# Patient Record
Sex: Female | Born: 1945
Health system: Southern US, Community
[De-identification: ages and names within clinical notes are randomized; demographics above are authoritative.]

## PROBLEM LIST (undated history)

## (undated) DIAGNOSIS — R51 Headache: Secondary | ICD-10-CM

## (undated) DIAGNOSIS — K219 Gastro-esophageal reflux disease without esophagitis: Secondary | ICD-10-CM

## (undated) DIAGNOSIS — F419 Anxiety disorder, unspecified: Secondary | ICD-10-CM

## (undated) DIAGNOSIS — M1990 Unspecified osteoarthritis, unspecified site: Secondary | ICD-10-CM

## (undated) DIAGNOSIS — D649 Anemia, unspecified: Secondary | ICD-10-CM

## (undated) DIAGNOSIS — Z8719 Personal history of other diseases of the digestive system: Secondary | ICD-10-CM

## (undated) DIAGNOSIS — K449 Diaphragmatic hernia without obstruction or gangrene: Secondary | ICD-10-CM

## (undated) DIAGNOSIS — M797 Fibromyalgia: Secondary | ICD-10-CM

## (undated) DIAGNOSIS — I10 Essential (primary) hypertension: Secondary | ICD-10-CM

## (undated) HISTORY — PX: HERNIA REPAIR: SHX51

## (undated) HISTORY — PX: COLON SURGERY: SHX602

## (undated) HISTORY — DX: Unspecified osteoarthritis, unspecified site: M19.90

## (undated) HISTORY — PX: OTHER SURGICAL HISTORY: SHX169

## (undated) HISTORY — PX: APPENDECTOMY: SHX54

## (undated) HISTORY — DX: Gastro-esophageal reflux disease without esophagitis: K21.9

---

## 1985-12-10 HISTORY — PX: BREAST EXCISIONAL BIOPSY: SUR124

## 2003-12-11 HISTORY — PX: HIATAL HERNIA REPAIR: SHX195

## 2005-09-22 ENCOUNTER — Emergency Department (HOSPITAL_COMMUNITY): Admission: EM | Admit: 2005-09-22 | Discharge: 2005-09-22 | Payer: Self-pay | Admitting: Emergency Medicine

## 2005-10-10 ENCOUNTER — Encounter: Admission: RE | Admit: 2005-10-10 | Discharge: 2005-10-10 | Payer: Self-pay | Admitting: Gastroenterology

## 2005-12-10 HISTORY — PX: HIATAL HERNIA REPAIR: SHX195

## 2006-02-28 ENCOUNTER — Inpatient Hospital Stay (HOSPITAL_COMMUNITY): Admission: RE | Admit: 2006-02-28 | Discharge: 2006-03-03 | Payer: Self-pay | Admitting: Surgery

## 2008-12-10 HISTORY — PX: LEFT COLECTOMY: SHX856

## 2008-12-10 HISTORY — PX: COLOSTOMY CLOSURE: SHX1381

## 2009-03-29 ENCOUNTER — Encounter: Admission: RE | Admit: 2009-03-29 | Discharge: 2009-03-29 | Payer: Self-pay | Admitting: Emergency Medicine

## 2009-03-30 ENCOUNTER — Inpatient Hospital Stay (HOSPITAL_COMMUNITY): Admission: EM | Admit: 2009-03-30 | Discharge: 2009-04-08 | Payer: Self-pay | Admitting: Emergency Medicine

## 2009-06-03 ENCOUNTER — Encounter: Admission: RE | Admit: 2009-06-03 | Discharge: 2009-06-03 | Payer: Self-pay | Admitting: General Surgery

## 2009-06-25 ENCOUNTER — Encounter (INDEPENDENT_AMBULATORY_CARE_PROVIDER_SITE_OTHER): Payer: Self-pay | Admitting: General Surgery

## 2009-06-25 ENCOUNTER — Inpatient Hospital Stay (HOSPITAL_COMMUNITY): Admission: EM | Admit: 2009-06-25 | Discharge: 2009-06-30 | Payer: Self-pay | Admitting: Emergency Medicine

## 2009-08-10 ENCOUNTER — Inpatient Hospital Stay (HOSPITAL_COMMUNITY): Admission: RE | Admit: 2009-08-10 | Discharge: 2009-08-14 | Payer: Self-pay | Admitting: General Surgery

## 2009-08-10 ENCOUNTER — Encounter (INDEPENDENT_AMBULATORY_CARE_PROVIDER_SITE_OTHER): Payer: Self-pay | Admitting: General Surgery

## 2009-12-30 ENCOUNTER — Encounter: Admission: RE | Admit: 2009-12-30 | Discharge: 2009-12-30 | Payer: Self-pay | Admitting: General Surgery

## 2010-03-31 ENCOUNTER — Encounter: Admission: RE | Admit: 2010-03-31 | Discharge: 2010-03-31 | Payer: Self-pay | Admitting: Emergency Medicine

## 2010-09-06 ENCOUNTER — Inpatient Hospital Stay (HOSPITAL_COMMUNITY): Admission: RE | Admit: 2010-09-06 | Discharge: 2010-09-08 | Payer: Self-pay | Admitting: General Surgery

## 2010-09-06 HISTORY — PX: VENTRAL HERNIA REPAIR: SHX424

## 2010-10-12 ENCOUNTER — Encounter: Admission: RE | Admit: 2010-10-12 | Discharge: 2010-10-12 | Payer: Self-pay | Admitting: General Surgery

## 2010-11-09 HISTORY — PX: LAPAROSCOPIC INCISIONAL / UMBILICAL / VENTRAL HERNIA REPAIR: SUR789

## 2010-11-27 ENCOUNTER — Inpatient Hospital Stay (HOSPITAL_COMMUNITY)
Admission: RE | Admit: 2010-11-27 | Discharge: 2010-11-29 | Payer: Self-pay | Source: Home / Self Care | Attending: Surgery | Admitting: Surgery

## 2011-02-19 LAB — BASIC METABOLIC PANEL
BUN: 12 mg/dL (ref 6–23)
CO2: 28 mEq/L (ref 19–32)
Calcium: 9.8 mg/dL (ref 8.4–10.5)
Chloride: 103 mEq/L (ref 96–112)
Creatinine, Ser: 0.74 mg/dL (ref 0.4–1.2)
GFR calc Af Amer: >60 mL/min (ref >60)
GFR calc non Af Amer: >60 mL/min (ref >60)
Glucose, Bld: 100 mg/dL — ABNORMAL HIGH (ref 70–99)
Potassium: 4.3 mEq/L (ref 3.5–5.1)
Sodium: 139 mEq/L (ref 135–145)

## 2011-02-19 LAB — SURGICAL PCR SCREEN
MRSA, PCR: NEGATIVE
Staphylococcus aureus: NEGATIVE

## 2011-02-19 LAB — GRAM STAIN: Gram Stain: NONE SEEN

## 2011-02-22 LAB — DIFFERENTIAL
Basophils Absolute: 0 10*3/uL (ref 0.0–0.1)
Basophils Relative: 1 % (ref 0–1)
Eosinophils Absolute: 0 10*3/uL (ref 0.0–0.7)
Eosinophils Relative: 1 % (ref 0–5)
Lymphocytes Relative: 27 % (ref 12–46)
Lymphs Abs: 1.3 10*3/uL (ref 0.7–4.0)
Monocytes Absolute: 0.3 10*3/uL (ref 0.1–1.0)
Monocytes Relative: 6 % (ref 3–12)
Neutro Abs: 3.1 10*3/uL (ref 1.7–7.7)
Neutrophils Relative %: 65 % (ref 43–77)

## 2011-02-22 LAB — URINALYSIS, ROUTINE W REFLEX MICROSCOPIC
Bilirubin Urine: NEGATIVE
Glucose, UA: NEGATIVE mg/dL
Hgb urine dipstick: NEGATIVE
Ketones, ur: NEGATIVE mg/dL
Nitrite: NEGATIVE
Protein, ur: NEGATIVE mg/dL
Specific Gravity, Urine: 1.007 (ref 1.005–1.030)
Urobilinogen, UA: 0.2 mg/dL (ref 0.0–1.0)
pH: 7 (ref 5.0–8.0)

## 2011-02-22 LAB — URINE MICROSCOPIC-ADD ON

## 2011-02-22 LAB — CBC
HCT: 33.9 % — ABNORMAL LOW (ref 36.0–46.0)
HCT: 40.5 % (ref 36.0–46.0)
Hemoglobin: 11.8 g/dL — ABNORMAL LOW (ref 12.0–15.0)
Hemoglobin: 14.2 g/dL (ref 12.0–15.0)
MCH: 32.5 pg (ref 26.0–34.0)
MCHC: 35.2 g/dL (ref 30.0–36.0)
MCV: 92.3 fL (ref 78.0–100.0)
MCV: 92.6 fL (ref 78.0–100.0)
Platelets: 209 10*3/uL (ref 150–400)
RBC: 3.66 MIL/uL — ABNORMAL LOW (ref 3.87–5.11)
RBC: 4.39 MIL/uL (ref 3.87–5.11)
RDW: 13.5 % (ref 11.5–15.5)
WBC: 4.8 10*3/uL (ref 4.0–10.5)
WBC: 6.1 10*3/uL (ref 4.0–10.5)

## 2011-02-22 LAB — BASIC METABOLIC PANEL
BUN: 9 mg/dL (ref 6–23)
CO2: 30 mEq/L (ref 19–32)
Calcium: 9.6 mg/dL (ref 8.4–10.5)
Chloride: 102 mEq/L (ref 96–112)
Chloride: 105 mEq/L (ref 96–112)
Creatinine, Ser: 0.63 mg/dL (ref 0.4–1.2)
GFR calc Af Amer: >60 mL/min (ref >60)
GFR calc Af Amer: >60 mL/min (ref >60)
GFR calc non Af Amer: >60 mL/min (ref >60)
Glucose, Bld: 93 mg/dL (ref 70–99)
Potassium: 3.9 mEq/L (ref 3.5–5.1)
Potassium: 4.3 mEq/L (ref 3.5–5.1)
Sodium: 140 mEq/L (ref 135–145)

## 2011-02-22 LAB — SURGICAL PCR SCREEN
MRSA, PCR: NEGATIVE
Staphylococcus aureus: NEGATIVE

## 2011-02-22 LAB — PROTIME-INR
INR: 1.02 (ref 0.00–1.49)
Prothrombin Time: 13.6 seconds (ref 11.6–15.2)

## 2011-02-22 LAB — APTT: aPTT: 32 seconds (ref 24–37)

## 2011-03-16 LAB — BASIC METABOLIC PANEL
BUN: 9 mg/dL (ref 6–23)
CO2: 27 mEq/L (ref 19–32)
Calcium: 8.8 mg/dL (ref 8.4–10.5)
GFR calc Af Amer: >60 mL/min (ref >60)
GFR calc Af Amer: >60 mL/min (ref >60)
GFR calc non Af Amer: >60 mL/min (ref >60)
Potassium: 4.6 mEq/L (ref 3.5–5.1)
Sodium: 137 mEq/L (ref 135–145)
Sodium: 137 mEq/L (ref 135–145)

## 2011-03-16 LAB — CBC
HCT: 33.4 % — ABNORMAL LOW (ref 36.0–46.0)
Hemoglobin: 11.4 g/dL — ABNORMAL LOW (ref 12.0–15.0)
Platelets: 219 10*3/uL (ref 150–400)
RBC: 3.73 MIL/uL — ABNORMAL LOW (ref 3.87–5.11)
WBC: 11.7 10*3/uL — ABNORMAL HIGH (ref 4.0–10.5)

## 2011-03-16 LAB — ABO/RH: ABO/RH(D): A POS

## 2011-03-16 LAB — TYPE AND SCREEN

## 2011-03-17 LAB — BASIC METABOLIC PANEL
GFR calc non Af Amer: >60 mL/min (ref >60)
Glucose, Bld: 87 mg/dL (ref 70–99)
Potassium: 4.9 mEq/L (ref 3.5–5.1)
Sodium: 142 mEq/L (ref 135–145)

## 2011-03-17 LAB — DIFFERENTIAL
Eosinophils Relative: 2 % (ref 0–5)
Lymphocytes Relative: 21 % (ref 12–46)
Lymphs Abs: 1.2 10*3/uL (ref 0.7–4.0)
Monocytes Absolute: 0.3 10*3/uL (ref 0.1–1.0)
Monocytes Relative: 5 % (ref 3–12)

## 2011-03-17 LAB — CBC
HCT: 37.5 % (ref 36.0–46.0)
Hemoglobin: 12.8 g/dL (ref 12.0–15.0)
WBC: 5.7 10*3/uL (ref 4.0–10.5)

## 2011-03-17 LAB — URINALYSIS, ROUTINE W REFLEX MICROSCOPIC
Glucose, UA: NEGATIVE mg/dL
pH: 5 (ref 5.0–8.0)

## 2011-03-18 LAB — COMPREHENSIVE METABOLIC PANEL
ALT: 19 U/L (ref 0–35)
Albumin: 3.8 g/dL (ref 3.5–5.2)
Alkaline Phosphatase: 54 U/L (ref 39–117)
BUN: 7 mg/dL (ref 6–23)
Chloride: 101 mEq/L (ref 96–112)
Glucose, Bld: 100 mg/dL — ABNORMAL HIGH (ref 70–99)
Potassium: 3.2 mEq/L — ABNORMAL LOW (ref 3.5–5.1)
Sodium: 136 mEq/L (ref 135–145)
Total Bilirubin: 1.3 mg/dL — ABNORMAL HIGH (ref 0.3–1.2)

## 2011-03-18 LAB — DIFFERENTIAL
Basophils Absolute: 0 10*3/uL (ref 0.0–0.1)
Basophils Relative: 0 % (ref 0–1)
Eosinophils Absolute: 0 10*3/uL (ref 0.0–0.7)
Monocytes Absolute: 0.3 10*3/uL (ref 0.1–1.0)
Neutro Abs: 7.9 10*3/uL — ABNORMAL HIGH (ref 1.7–7.7)

## 2011-03-18 LAB — CBC
HCT: 33.3 % — ABNORMAL LOW (ref 36.0–46.0)
HCT: 41.2 % (ref 36.0–46.0)
Hemoglobin: 10.2 g/dL — ABNORMAL LOW (ref 12.0–15.0)
Hemoglobin: 11.4 g/dL — ABNORMAL LOW (ref 12.0–15.0)
Hemoglobin: 14.2 g/dL (ref 12.0–15.0)
Platelets: 200 10*3/uL (ref 150–400)
Platelets: 244 10*3/uL (ref 150–400)
RDW: 14.6 % (ref 11.5–15.5)
RDW: 15.4 % (ref 11.5–15.5)
WBC: 8.1 10*3/uL (ref 4.0–10.5)
WBC: 9.1 10*3/uL (ref 4.0–10.5)

## 2011-03-18 LAB — BASIC METABOLIC PANEL
CO2: 26 mEq/L (ref 19–32)
Calcium: 8 mg/dL — ABNORMAL LOW (ref 8.4–10.5)
Calcium: 8.3 mg/dL — ABNORMAL LOW (ref 8.4–10.5)
Calcium: 8.6 mg/dL (ref 8.4–10.5)
Creatinine, Ser: 0.46 mg/dL (ref 0.4–1.2)
GFR calc Af Amer: >60 mL/min (ref >60)
GFR calc Af Amer: >60 mL/min (ref >60)
GFR calc non Af Amer: >60 mL/min (ref >60)
GFR calc non Af Amer: >60 mL/min (ref >60)
Glucose, Bld: 100 mg/dL — ABNORMAL HIGH (ref 70–99)
Potassium: 4.1 mEq/L (ref 3.5–5.1)
Sodium: 137 mEq/L (ref 135–145)
Sodium: 139 mEq/L (ref 135–145)

## 2011-03-18 LAB — URINALYSIS, ROUTINE W REFLEX MICROSCOPIC
Bilirubin Urine: NEGATIVE
Ketones, ur: NEGATIVE mg/dL
Nitrite: NEGATIVE
Protein, ur: NEGATIVE mg/dL
Specific Gravity, Urine: 1.008 (ref 1.005–1.030)
Urobilinogen, UA: 0.2 mg/dL (ref 0.0–1.0)

## 2011-03-18 LAB — SAMPLE TO BLOOD BANK

## 2011-03-18 LAB — URINE MICROSCOPIC-ADD ON

## 2011-03-18 LAB — APTT: aPTT: 33 seconds (ref 24–37)

## 2011-03-18 LAB — PHOSPHORUS: Phosphorus: 3.7 mg/dL (ref 2.3–4.6)

## 2011-03-21 LAB — CBC
HCT: 27.5 % — ABNORMAL LOW (ref 36.0–46.0)
HCT: 28.1 % — ABNORMAL LOW (ref 36.0–46.0)
Hemoglobin: 10.1 g/dL — ABNORMAL LOW (ref 12.0–15.0)
Hemoglobin: 11.9 g/dL — ABNORMAL LOW (ref 12.0–15.0)
MCHC: 34.2 g/dL (ref 30.0–36.0)
MCHC: 34.7 g/dL (ref 30.0–36.0)
MCV: 89.3 fL (ref 78.0–100.0)
MCV: 89.4 fL (ref 78.0–100.0)
MCV: 89.7 fL (ref 78.0–100.0)
MCV: 89.8 fL (ref 78.0–100.0)
Platelets: 322 10*3/uL (ref 150–400)
Platelets: 414 10*3/uL — ABNORMAL HIGH (ref 150–400)
RBC: 3.14 MIL/uL — ABNORMAL LOW (ref 3.87–5.11)
RBC: 3.99 MIL/uL (ref 3.87–5.11)
RBC: 4.29 MIL/uL (ref 3.87–5.11)
RDW: 13.8 % (ref 11.5–15.5)
RDW: 14.2 % (ref 11.5–15.5)
WBC: 5.4 10*3/uL (ref 4.0–10.5)
WBC: 5.9 10*3/uL (ref 4.0–10.5)
WBC: 8.5 10*3/uL (ref 4.0–10.5)
WBC: 9.7 10*3/uL (ref 4.0–10.5)

## 2011-03-21 LAB — BASIC METABOLIC PANEL
BUN: 1 mg/dL — ABNORMAL LOW (ref 6–23)
BUN: 3 mg/dL — ABNORMAL LOW (ref 6–23)
Calcium: 8.6 mg/dL (ref 8.4–10.5)
Chloride: 106 mEq/L (ref 96–112)
Chloride: 106 mEq/L (ref 96–112)
Chloride: 107 mEq/L (ref 96–112)
Creatinine, Ser: 0.65 mg/dL (ref 0.4–1.2)
Creatinine, Ser: 0.72 mg/dL (ref 0.4–1.2)
GFR calc Af Amer: >60 mL/min (ref >60)
GFR calc non Af Amer: >60 mL/min (ref >60)
Potassium: 4 mEq/L (ref 3.5–5.1)
Sodium: 139 mEq/L (ref 135–145)

## 2011-03-21 LAB — URINALYSIS, ROUTINE W REFLEX MICROSCOPIC
Bilirubin Urine: NEGATIVE
Glucose, UA: NEGATIVE mg/dL
Ketones, ur: 15 mg/dL — AB
Nitrite: NEGATIVE
Protein, ur: NEGATIVE mg/dL

## 2011-03-21 LAB — LIPASE, BLOOD: Lipase: 12 U/L (ref 11–59)

## 2011-03-21 LAB — COMPREHENSIVE METABOLIC PANEL
ALT: 17 U/L (ref 0–35)
Alkaline Phosphatase: 86 U/L (ref 39–117)
CO2: 22 mEq/L (ref 19–32)
Chloride: 99 mEq/L (ref 96–112)
GFR calc non Af Amer: >60 mL/min (ref >60)
Glucose, Bld: 105 mg/dL — ABNORMAL HIGH (ref 70–99)
Potassium: 3.9 mEq/L (ref 3.5–5.1)
Sodium: 133 mEq/L — ABNORMAL LOW (ref 135–145)

## 2011-03-21 LAB — PROTIME-INR
INR: 1.6 — ABNORMAL HIGH (ref 0.00–1.49)
Prothrombin Time: 19.5 seconds — ABNORMAL HIGH (ref 11.6–15.2)

## 2011-03-21 LAB — DIFFERENTIAL
Basophils Relative: 1 % (ref 0–1)
Eosinophils Absolute: 0 10*3/uL (ref 0.0–0.7)
Neutrophils Relative %: 86 % — ABNORMAL HIGH (ref 43–77)

## 2011-03-21 LAB — APTT: aPTT: 41 seconds — ABNORMAL HIGH (ref 24–37)

## 2011-03-21 LAB — URINE MICROSCOPIC-ADD ON

## 2011-04-24 NOTE — Discharge Summary (Signed)
NAMEMarland Kitchen  COLBI, SCHILTZ NO.:  [id number]   MEDICAL RECORD NO.:  [id number]          PATIENT TYPE:  INP   LOCATION:  1536                         FACILITY:  [facility]   PHYSICIAN:  Almond Lint, MD       DATE OF BIRTH:  10/05/1946   DATE OF ADMISSION:  06/25/2009  DATE OF DISCHARGE:  06/30/2009                               DISCHARGE SUMMARY   DIAGNOSIS:  Perforated diverticulitis.   PRINCIPAL PROCEDURE:  Hartmann's procedure with therapeutic appendectomy   Secondary procedure  CT scan demonstrating diffuse free air in the abdomen and probable  ruptured diverticulitis.   Condition on discharge is improved.   DISCHARGE MEDICATIONS:  Include Augmentin until she finishes the  remainder of her course. Multivitamin, calcium, and Percocet 5/325 one-2  tablets q.4 h p.r.n. pain and Colace are added.   HOSPITAL COURSE:  Ms. Haymaker presented to the emergency department on  June 25, 2009 with ruptured diverticulitis.  She was taken to the  operating on that day and a Hartmann's procedure was performed, which  consisted of a sigmoidectomy and descending colostomy on the left.  Also  she had a therapeutic appendectomy as the appendiceal tip was ruptured  and involved an abscess cavity which was contiguous with perforation of  the diverticulitis.  Postoperatively, she has done very well.  She had a  drain placed in the abscess cavity intraoperatively which has since been  removed.  She tolerated Foley removal well.  Her diet has been advanced  to regular, and she is tolerating this.  She is tolerating oral pain  medications.  She is able to ambulate without difficulty and has  demonstrated the ability to change her ostomy bag.  She will go home  today and will follow up with me in 2-3 weeks.  She will come back to  our clinic in 1 week for staple removal.  We will send her with home  health nursing to check on her 1 or 2 times to make sure she is still  doing well with her  ostomy changes.  She is planning on staying at her  daughter's house for a week or 2 weeks until she gets back on her feet.      Almond Lint, MD  Electronically Signed     FB/MEDQ  D:  06/30/2009  T:  06/30/2009  Job:  [phone]

## 2011-04-24 NOTE — H&P (Signed)
NAMEMarland Kitchen  Carol, Roberts NO.:  [id number]   MEDICAL RECORD NO.:  [id number]          PATIENT TYPE:  INP   LOCATION:  1522                         FACILITY:  [facility]   PHYSICIAN:  Carol Lint, MD       DATE OF BIRTH:  May 29, 1946   DATE OF ADMISSION:  08/10/2009  DATE OF DISCHARGE:                              HISTORY & PHYSICAL   CHIEF COMPLAINT:  Diverticulitis.   HISTORY OF PRESENT ILLNESS:  Carol Roberts is a 65 year old female who I  performed an emergent Hartmann's procedure with therapeutic appendectomy  on June 25, 2009.  She presents today for colostomy takedown.  She has  done her bowel prep.  She is off her pain medicine.  She has not been  having any nausea, vomiting, and has normal appetite.  She is not losing  weight.   PAST MEDICAL HISTORY:  Significant for arthritis, diverticulitis, and  questionable possibility of IBS.   SURGICAL HISTORY:  Nissen fundoplication in 2000, prior hiatal hernia  repairs, Nissen fundoplication redo in 2007.  Left breast biopsy.  Hartmann's and therapeutic appendectomy in July of this year.   SOCIAL HISTORY:  She has one daughter.  She does not abuse substances.  She is a [employer] representative.   FAMILY HISTORY:  Mother, maternal grandmother, and great grandmother  have MI history.   ALLERGIES:  No known drug allergies, but is very sensitive to anything  other than paper tape.   MEDICATIONS:  Vitamin, calcium 600 plus D, Vitamin C 500 mg once a day,  vitamin D 1000 units in the morning, and multivitamin once a day.   PHYSICAL EXAMINATION:  VITAL SIGNS:  She is afebrile.  Her vitals are  normal.  GENERAL:  She is alert and oriented x3, in no acute distress.  HEENT:  Normocephalic and atraumatic.  Sclerae anicteric.  HEART:  Regular.  LUNGS:  Clear.  ABDOMEN:  Soft, nontender, and nondistended.  Stamina is intact.  EXTREMITIES:  Warm and well perfused.   ASSESSMENT:  Carol Roberts presents for colostomy takedown.  IVF,  IV  Antibiotics.      Carol Lint, MD  Electronically Signed     FB/MEDQ  D:  08/10/2009  T:  08/10/2009  Job:  [id number]

## 2011-04-24 NOTE — Discharge Summary (Signed)
NAME:  MACIL, CRADY NO.:  [id number]   MEDICAL RECORD NO.:  [id number]          PATIENT TYPE:  INP   LOCATION:  5120                         FACILITY:  [facility]   PHYSICIAN:  Juanetta Gosling, MDDATE OF BIRTH:  1946/03/24   DATE OF ADMISSION:  03/30/2009  DATE OF DISCHARGE:  04/08/2009                               DISCHARGE SUMMARY   CHIEF COMPLAINT/REASON FOR ADMISSION:  Ms. Lucien is a 65 year old  female patient who has had previous GI problems dating back to 1999.  Initially, they thought she had Crohn disease but after GI evaluation,  this diagnosis was declined.  She presents with 3 weeks of fevers and  left lower quadrant abdominal pain with difficulty urinating and  worsening pain over the past few days.  She had been on antibiotics from  her primary care physician for a total of 10 days without any  improvement in her symptoms.  Because the pain had worsened, she  presented to the ER.  In the ER, the patient was noted to have low-grade  fever of 99.5, mildly tachycardic with a heart rate of 104, otherwise  vital signs were stable.  On exam, her abdomen was soft, nontender, and  nondistended.  Because of treatment for possible diverticulitis, it was  opted to perform a CT scan.  This revealed a 7-cm diverticular abscess.  The patient's white count was 13,000.  The patient was admitted by Dr.  Donell Beers with the following diagnosis:  Diverticulitis with  microperforation and abscess.   HOSPITAL COURSE:  The patient was admitted and placed on n.p.o. status,  IV fluids, and empiric antibiotic therapy with Invanz.  Symptoms were  managed with antiemetics and IV pain medications.   Due to there being a diverticular abscess, Interventional Radiology  reviewed the CT scan and the patient did undergo drainage of the left  lower quadrant diverticular abscess on March 31, 2009, 80 mL of bloody  pus was removed.  The patient continued to improve clinically.  Her  white count normalized, and she eventually defervesced.  She was started  on a clear liquid diet within 48 hours of admission, continued to do  well.  Diet was slowly advanced.  She did have some complaints of some  sharp abdominal pain, so we were slow to otherwise advance the diet.  Repeat CT scan was performed on April 26 that revealed resolution of the  drain fluid collections, so the drain was removed by Interventional  Radiology.  She did have a residual phlegmon, so she was continued on IV  antibiotics.   By April 07, 2009, the patient was afebrile, vital signs were stable.  Subsequent abscess culture revealed few microaerophilic strep.  White  count remained normal at 5900.  Electrolyte panel was normal.  The  patient had mild abdominal pain by report but on exam, she was  essentially nontender.  After discussing with Dr. Dwain Sarna, it was  determined that we would change her Invanz to Augmentin p.o. and  continue to follow her white count and if she remained stable by April 08, 2009, she would be  appropriate for discharge.   On April 08, 2009, the patient's abdomen was benign.  She was tolerating  oral pain meds and oral Augmentin and was otherwise deemed appropriate  for discharge home.   FINAL DISCHARGE DIAGNOSIS:  1. Abdominal pain secondary to acute diverticulitis with      microperforation and abscess.  2. Status post percutaneous drainage of diverticular abscess, resolved      on follow up CT, drain now removed.   DISCHARGE MEDICATIONS:  1. The patient will stop the Cipro and Flagyl she had taken prior to      admission.  If she has any left, she is not to take this any      further.  2. Multiple vitamin daily.  3. Calcium as before daily.  4. Tylenol as before.   NEW MEDICATIONS:  1. Augmentin 875 mg b.i.d. for 7 days.  2. Vicodin 5/325 one to two tablets every 4 hours as needed for pain,      #20 dispensed with 0 refills.   Return to work on Monday, Apr 11, 2009.  A note has been given.   DIET:  She should be on a low-residue diet.   WOUND CARE:  Not applicable.   ACTIVITY:  Increase activity slowly.  May walk up steps.  May shower.   FOLLOWUP APPOINTMENTS:  She needs to call Dr. Donell Beers, the admitting  physician, so she can be seen in 2 weeks for followup.      Allison L. Marya Landry, MD  Electronically Signed    ALE/MEDQ  D:  04/08/2009  T:  04/09/2009  Job:  [id number]   cc:   Almond Lint, MD

## 2011-04-24 NOTE — H&P (Signed)
NAMEMarland Kitchen  Carol, Roberts NO.:  [id number]   MEDICAL RECORD NO.:  [id number]          PATIENT TYPE:  INP   LOCATION:  0104                         FACILITY:  [facility]   PHYSICIAN:  Almond Lint, MD       DATE OF BIRTH:  1946/07/29   DATE OF ADMISSION:  06/25/2009  DATE OF DISCHARGE:                              HISTORY & PHYSICAL   REFERRING PHYSICIAN:  Devoria Albe.   CHIEF COMPLAINT:  Diverticulitis.   HISTORY OF PRESENT ILLNESS:  Ms. Carol Roberts is a 65 year old patient whom I  saw back in April for a diverticular abscess.  We had the abscess  drained and got her out of the hospital.  She has had several courses of  antibiotics and we have had her on the elective schedule for August for  a laparoscopic assisted sigmoid colectomy.  She presents with 24 hours  of worsening abdominal pain, dry heaves and temperatures of 99.  She  also has had general malaise.  She thinks this is much worse pain than  what she had before.  She also developed the inability to tolerate p.o.  The pain is worse with movement and is alleviated by laying curled up.  She denies constipation but has had some diarrhea.   PAST MEDICAL HISTORY:  Significant for arthritis, diverticulitis and  questionable past history of irritable bowel syndrome.  There is a  question of Crohn's disease in the past but she states that this was  ruled out.   SURGICAL HISTORY:  1. Nissen fundoplication by Dr. Daphine Deutscher in mid 2000.  2. She has had two hiatal hernia repairs, one at [facility] and a      redo by Dr. Daphine Deutscher in 2007.  3. Left breast biopsy.   SOCIAL HISTORY:  She does not abuse substances.  She is a billing  specialist.   FAMILY HISTORY:  She has had a mother, a maternal grandmother and great  grandmother with history of heart attacks.   NO KNOWN DRUG ALLERGIES.   MEDICATIONS:  Augmentin.   REVIEW OF SYSTEMS:  Otherwise negative other than the past medical  history and HPI x 11 systems.   EXAMINATION:  Temperature is 98.7, pulse 90, respiratory rate 18, blood  pressure 176/59.  She looks very uncomfortable in general.  HEENT:  Normocephalic/atraumatic, sclerae are anicteric.  NECK:  Supple.  HEART:  Regular.  LUNGS:  Clear.  ABDOMEN:  Soft, slightly distended, exquisitely tender in the left lower  quadrant and mildly tender diffusely.  EXTREMITIES:  Warm and well-perfused with no edema.  SKIN:  No evidence of rashes.   LABS:  CT scan demonstrates free air and inflamed sigmoid.  There is  also still a hint of a chronic abscess cavity in the pelvis.  There is  dilated small bowel.  White blood cell count is 9, potassium 3.2,  bilirubin slightly elevated at 1.3, otherwise labs are essentially  unremarkable.   ASSESSMENT:  Ms. Carol Roberts is a 65 year old female with perforated  diverticulitis.  We will take her to the operating room for a Hartmann's  procedure which is  a sigmoidectomy and end ostomy.  This was discussed  with the patient.  We discussed that it would now be an open procedure  rather than laparoscopic and that there would be a temporary ostomy in  place most likely.  Will give her Invanz for intravenous antibiotics,  also NPO and consent.  She will receive a type and screen and coags  prior to going to the operating room.      Almond Lint, MD  Electronically Signed     FB/MEDQ  D:  06/25/2009  T:  06/25/2009  Job:  [id number]

## 2011-04-24 NOTE — Op Note (Signed)
NAMEMarland Kitchen  Carol, Roberts NO.:  [id number]   MEDICAL RECORD NO.:  [id number]          PATIENT TYPE:  INP   LOCATION:  1522                         FAC[facility]   PHYSICIAN:  Almond Lint, MD       DATE OF BIRTH:  May 24, 1946   DATE OF PROCEDURE:  08/10/2009  DATE OF DISCHARGE:                               OPERATIVE REPORT   PREOPERATIVE DIAGNOSIS:  Diverticulitis.   POSTOPERATIVE DIAGNOSIS:  Diverticulitis.   PROCEDURE PERFORMED:  Colostomy takedown.   SURGEON:  Almond Lint, MD   ASSISTANT:  Angelia Mould. Derrell Lolling, MD   ANESTHESIA:  General plus local.   FINDINGS:  Very scarred midline fascia, minimal intraabdominal  adhesions.   SPECIMEN:  1. Sigmoid.  2. Donuts of the anastomosis.  3. Stoma site, all to pathology for permanent suction.   ESTIMATED BLOOD LOSS:  Less than 25 mL.   COMPLICATIONS:  None known.   PROCEDURE:  Carol Roberts was identified in the holding area and taken to  the operating room where she was placed supine on the operating room  table.  General anesthesia was induced.  The patient was then placed in  the lithotomy position.  The Foley catheter was placed.  The stoma was  closed with a 2-0 Prolene in pursestring fashion.  The abdomen and  perineum were then prepped and draped in a sterile fashion.  The time-  out was performed according to the surgical safety check list.  When all  was correct, we continued.  The midline was opened with a #10 blade.  The old scar was cut out.  Bovie was used to carry the dissection down  through the subcutaneous skin.  The fascia was noted to be extremely  scarred and so this was entered in the midline at the superior aspect of  the wound where there was not much scarring.  The fascia was entered  sharply and then digital palpation was used to confirm there were no  adhesions posterior to the remainder of the midline incision.  The  remainder was carried with the Bovie to the superior and inferior  border  of the incision.  There were few omental adhesions to the posterior  fascia and these were taken down sharply.  There were minimal pelvic  adhesions.  The stump was noted to be very long and this was elevated at  the abdomen.  LigaSure was used to take the mesentery of the sigmoid  colon after placing the Balfour in the abdomen and packing it with small  bowel out of the way.  The LigaSure was used to take the mesentery down  on the sigmoid until the tinea ended at the top of the rectum.  The  colon wall was cleaned off and this was stapled with a 75-mm GIA.  The  stitch was placed in the distal end of the specimen and passed off  table.  The attention then directed to the ostomy.  There were small  amount of intraabdominal adhesions, which were taken with the Bovie.  The mucocutaneous junction of the stoma was then injected  with a mixture  of local anesthetic and saline.  A 15 blade was used to make a small  circumferential incision around the ostomy.  The Allis was used to  elevate the ostomy and the Bovie was used to dissect the parastomal  tissues.  This was used to go all the way down to the fascia.  The  finger was then passed into the fascia and used to help take the  adhesions circumferentially to the fascia.  The stoma was passed back  into the abdomen and the remaining adhesions to the abdominal wall were  taken down.  The very end of the stoma was taken off with scissors.  The  sizers were used to dilate the end of the colon up to 29 mm.  The 33 was  too large.  The EEA was opened at 29 mm.  The anvil was placed into the  end of the descending colon and a 0 Prolene suture was used to create  the pursestring around this.  This was secured in place.  A second  pursestring was placed with a 2-0 Prolene in order to fix this one side  of the serosal tear.  The bile was cleaned off of the edge to create a  good anastomosis.  The EEA was then advanced through the rectum.  It   would not quite go all the way to the top of the rectum, so the stapler  was deployed out of the anterior rectal wall.  This couples well and  care was taken to make sure that the descending colon was not twisted.  The stapler was fired and pressure held for a minute.  The pressure was  held prior to firing and after firing.  The stapler was then removed  gently and the doughnuts were noted to be intact.  Proctoscope was  advanced into the rectum and used to confirm that the rectum distended  and that there was no bleeding on the staple line and no leak seen from  the staple line.  This was successful.  There were no leaks seen.  The  abdomen was then copiously irrigated and inspected for bleeding.  There  was a small flap on the omentum and this was taken with the LigaSure.  The fascial incision of the colostomy was first closed using interrupted  #1 Novafil.  There was still a defect after placement the sutures and  two additional sutures were placed.  Then, there were no defects  palpable.  The midline fascia was then closed after a count confirmed.  Needle and sponge counts was performed.  This was closed with 2-0 PDS  sutures, 0 looped PDS sutures.  The skin of the colostomy site and the  midline incision was irrigated.  The wound was closed with staples.  The  patient was then awakened from anesthesia.  The wound was then cleaned,  dried, and dressed with gauze and paper tape.  The patient was awakened  from anesthesia and taken to PACU in stable condition.      Almond Lint, MD  Electronically Signed     FB/MEDQ  D:  08/10/2009  T:  08/11/2009  Job:  [id number]

## 2011-04-24 NOTE — Op Note (Signed)
NAMEMarland Kitchen  AAILYAH, DUNBAR NO.:  [id number]   MEDICAL RECORD NO.:  [id number]          PATIENT TYPE:  INP   LOCATION:  0104                         FACILITY:  [facility]   PHYSICIAN:  Almond Lint, MD       DATE OF BIRTH:  June 17, 1946   DATE OF PROCEDURE:  06/25/2009  DATE OF DISCHARGE:                               OPERATIVE REPORT   PREOPERATIVE DIAGNOSIS:  Ruptured diverticulitis.   POSTOPERATIVE DIAGNOSES:  Ruptured diverticulitis with abscess and  ruptured appendiceal tip.   OPERATION:  Hartmann's procedure and therapeutic appendectomy.   SURGEON:  Marilynne Halsted, M.D.   ASSISTANT:  Anselm Pancoast. Zachery Dakins, M.D.   ANESTHESIA:  General.   FINDINGS:  Inflamed ruptured sigmoid with adjacent abscess.  The tip of  the appendix was involved in the abscess and was also ruptured.   SPECIMENS:  Appendix plus sigmoid colon to Pathology.   ESTIMATED BLOOD LOSS:  100 mL.   COMPLICATIONS:  None known.   PROCEDURE:  Ms. Perusse was identified in the holding area and taken to  the operating room where she was placed supine on the operating room  table.  General endotracheal anesthesia was induced.  Her abdomen was  prepped and draped in a sterile fashion.  A time-out was performed  according to the surgical safety check list.  When all was correct we  continued.  The midline was incised with a #10 blade.  The subcutaneous  tissue was divided with Bovie electrocautery.  The fascia was then  entered in the midline.  The preperitoneal fat was elevated and opened  as well.  The peritoneum was opened and palpation was used to ensure  that the small bowel was out of the incision while the remainder of the  fascial incision was opened with the Bovie.  At the bottom, the  preperitoneal fat was veed off to avoid entering of the bladder.  A  Balfour retractor was used for visualization.  The sigmoid colon was  extremely inflamed and there was an abscess in the pelvis.  This was  drained.   In lifting up the inflamed sigmoid, there were several loops  of small bowel that were adherent.  These were very easily bluntly  divided off of the sigmoid.  However, the tip of the appendix was  involved in the abscess and was grossly ruptured at the tip.  This was  traced back to the cecum and this was clamped at the base of the [place].  The medial appendix was taken down with the LigaSure.  The appendix was  divided between two clamps and oversewn with a 2-0 Vicryl.  The stump  was then inverted with an additional 2-0 Vicryl.  The sigmoid was  identified and the area of inflammation was visualized.  The sigmoid was  very redundant and the distal sigmoid was seen to be noninflamed.  The  mesocolon was opened up on either side of the Bovie and a Kelly clamp  was used to pass underneath the colon.  The colon was divided with a 75-  mm GIA.  The LigaSure was  used to divide some of the mesocolon right  next to the colon.  This was noted to be extremely friable and therefore  the remainder of the mesocolon was taken with clamps and ties.  The  distal descending colon was identified as an appropriate site for  division and this was also noninflamed.  The mesocolon was opened on  either side of the Bovie and the Endo-GIA used to divide the descending  colon.  The remainder of the mesocolon was divided with ties and clamps.  There were several sites that were still bleeding as the mesocolon was  extremely friable; 2-0 Vicryl sutures were used to oversew areas of  bleeding.  Following the placement of three sutures, there was no  additional bleeding seen.  The pelvis was irrigated copiously with  several liters of saline.  The small bowel was then run making sure that  there were no sites of injury to the small bowel and no interloop  abscesses.  There were the two loops that had been adherent to the  sigmoid, but these were intact on the serosa and there was no evidence  for mesenteric abscess  in this location.  The appendiceal stump was  intact.  The descending colon was then freed up, although not all way to  the splenic flexure.  Once the mass was freed up to create the ostomy, a  19 Blake drain was placed in the pelvis and the abscess cavity.  The  omentum was pulled down over the top of the bowel.  The Kocher was used  to retract the fascia medially and the skin was opened in a circular  fashion with the Bovie.  The subcutaneous tissue was divided with the  Bovie and the fascia was opened as well with the Bovie in a cruciate  fashion.  A hole large enough for the passage of two fingers was  created.  The end of the descending colon was passed through the  abdominal wall under no tension.  The fascia was then closed using 2-0  looped PDS sutures.  The skin was then irrigated and stapled.  The drain  was sutured into place with 2-0 nylon.  The ostomy was matured using 4-0  Vicryl interrupted sutures.  Prior to maturing the ostomy, the skin was  stapled loosely.  After returning to the ostomy, an ostomy clamp was  placed and a wound dressing was placed over the incision.  The patient  was awakened from anesthesia and taken to the PACU in stable condition.      Almond Lint, MD  Electronically Signed     FB/MEDQ  D:  06/25/2009  T:  06/25/2009  Job:  [id number]

## 2011-04-24 NOTE — H&P (Signed)
NAME:  Carol Roberts, KLUGE NO.:  [id number]   MEDICAL RECORD NO.:  [id number]          PATIENT TYPE:  INP   LOCATION:  5120                         FACILITY:  [facility]   PHYSICIAN:  Almond Lint, MD       DATE OF BIRTH:  March 12, 1946   DATE OF ADMISSION:  03/30/2009  DATE OF DISCHARGE:                              HISTORY & PHYSICAL   CHIEF COMPLAINT:  Diverticular abscess.   HISTORY OF PRESENT ILLNESS:  Carol Roberts is a 65 year old female with  around 3 weeks of fevers and left lower quadrant abdominal pain.  She  now complains of difficulty urinating and worsening of this pain, almost  intolerable to movement.  She has had 10 days of antibiotics from her  primary care physician without improvement.  She has developed worsening  pain in the last 24 hours and decreased appetite.   PAST MEDICAL HISTORY:  1. IBS.  2. Possible diagnosis of Crohn's back in 1999.  She states that she      took Asacol for a while, but then GI decided that this was not      Crohn's.   PAST SURGICAL HISTORY:  She has had 2 hiatal hernia repairs with one  being at [facility] and the following one being a redo by Dr. Daphine Deutscher in  2007.  She also had a left breast biopsy.   FAMILY HISTORY:  Mother, maternal grandmother, and maternal great-great-  grandmother have all had heart attacks.   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  She has not had substance abuse history.   ALLERGIES:  None.   MEDICATIONS AT HOME:  She has been taking oral Cipro, Flagyl, as well as  hydrocodone, multivitamin, calcium, and Tylenol.   REVIEW OF SYSTEMS:  Otherwise, negative.   PHYSICAL EXAMINATION:  VITAL SIGNS:  Temperature is 99.5, pulse 104,  blood pressure 138/74, respiratory rate is 15.  GENERAL:  She is alert and oriented x3 in no acute distress.  HEENT:  Normocephalic, atraumatic.  NECK:  Supple with no lymphadenopathy.  Trachea is midline.  ABDOMEN:  Soft, nontender,  and nondistended.  CHEST:  Nontender.  EXTREMITIES:  Warm and well perfused with palpable pulses bilaterally.   LABORATORY DATA:  X-rays, she has a 7-cm diverticular abscess.  She has  a white count of 13.  Also, her UA demonstrates moderate amount of LE.   IMPRESSION:  Carol Roberts is a 65 year old female with diverticular  abscess.  We will admit her and place her on IV fluids and IV  antibiotics.  She will be n.p.o.  We will place a percutaneous drain.      Almond Lint, MD  Electronically Signed     FB/MEDQ  D:  03/31/2009  T:  03/31/2009  Job:  [id number]

## 2011-04-27 NOTE — Discharge Summary (Signed)
NAME:  Carol Roberts, SAUSEDA NO.:  [id number]   MEDICAL RECORD NO.:  [id number]          PATIENT TYPE:  INP   LOCATION:  1429                         FACILITY:  [facility]   PHYSICIAN:  Thornton Park. Daphine Deutscher, MD  DATE OF BIRTH:  29-Apr-1946   DATE OF ADMISSION:  02/28/2006  DATE OF DISCHARGE:  03/03/2006                                 DISCHARGE SUMMARY   ADMITTING DIAGNOSIS:  Recurrent hiatal hernia, presenting with nausea,  pressure, dysphagia and coughing.   COURSE IN THE HOSPITAL:  [name] is a 65 year old lady who had  previously undergone a laparoscopic hiatal hernia repair in [facility] in [place] in April 2005.  She developed significant  foregut symptoms and an upper GI endoscopy showed a recurrence of her hiatal  hernia.   She was an a.m. admission and underwent a 5-hour 15-minute laparoscopic  takedown of an incarcerated hiatal hernia with repair of a hiatal hernia  defect with allograft by Dr. Daphine Deutscher.  Upper endoscopy was performed during  the case.  The [employer] item [id number] was used  in a size of 8 x 12 cm.  This was sutured in place to repair a very large  defect.   Following her surgery, she did well.  She had an x-ray showing the reduction  of her hernia and it looked good and no evidence of a leak.  She was started  on liquids and was ready for discharge on postoperative day 3.   FOLLOWUP:  Arrangements were made for her to return to the office in 1-2  weeks.   DISCHARGE MEDICATIONS:  She was given Lortab Elixir to take for pain and she  was having some loose stools and was given Imodium for diarrhea by Dr.  Abbey Chatters.   CONDITION:  Improved.      Thornton Park Daphine Deutscher, MD  Electronically Signed     MBM/MEDQ  D:  03/17/2006  T:  03/18/2006  Job:  [id number]   cc:   Graylin Shiver, M.D.  Fax: [phone]   C. Duane Lope, M.D.  Fax: [phone]

## 2011-04-27 NOTE — Op Note (Signed)
NAME:  Carol Roberts, Carol Roberts NO.:  [id number]   MEDICAL RECORD NO.:  [id number]          PATIENT TYPE:  INP   LOCATION:  1429                         FACILITY:  [facility]   PHYSICIAN:  Thornton Park. Daphine Deutscher, MD  DATE OF BIRTH:  [birth year]   DATE OF PROCEDURE:  02/28/2006  DATE OF DISCHARGE:                                 OPERATIVE REPORT   PREOPERATIVE DIAGNOSIS:  Recurrent giant hiatal hernia after laparoscopic  Nissen fundoplication done Roberts and at [facility] in  [place] in April 2005.   POSTOPERATIVE DIAGNOSIS:  Not given.   PROCEDURE:  Laparoscopic takedown of herniated stomach and chest, upper  endoscopy, repair of diaphragmatic defect with musculoskeletal human  allograft patch.   SURGEON:  Thornton Park. Daphine Deutscher, MD.   ASSISTANT:  Adolph Pollack, M.D.   OPERATIVE TIME:  5 hours and 20 minutes.   DESCRIPTION OF PROCEDURE:  Carol Roberts was taken to OR 1 on Thursday,  February 28, 2006 to attempt to repair her large recurrent hiatal hernia  laparoscopically. She is well aware this could possibly have to be done  open. She has mainly been having a lot of problems with nausea, pressure,  dysphagia and coughing. After general anesthesia was administered, the  abdomen was prepped with chlorhexidine and draped sterilely. I entered the  abdomen through the left upper quadrant through an old incision excising the  scar in and using an Optiview technique without difficulty. The abdomen was  insufflated. A 5 mm was placed lateral to that, another 10/11 and to the  left of the umbilicus, two 10/11s in the right upper quadrant and a 5 in the  upper abdomen for the Carol Roberts retractor were used. The Carol Roberts was  inserted to retract the liver.   Using the harmonic scalpel, I began the dissection where the stomach was  stuck to the liver. I carried this up to the diaphragm where I encountered  some of the tenacious adhesions of the gastric remnant to the  diaphragm. At  times I thought maybe she had some artificial material in there, although  previous review of the op note by me had not indicated any pledgets or  artificial material. A very tedious 4 hour dissection followed until such  time as I was able to reduce the stomach completely from the chest and in so  doing found a massive cavity that was lined and was I unable to actually  remove the hiatal hernia sac.   The wrap had been taken Roberts partially but was still about a 270 degree wrap  and it was subsequently left in that position. A hiatal hernia was which was  massive, there was no way it could even be approximated primarily. It was  then repaired with a piece of human allograft, a piece that was 8 cm x 12  cm. This was tacked in the mid portion of the of the apex anteriorly and  then sutured with approximately 19 Endostitches around the parameter of the  diaphragm. This was sutured to the perimeter and then cut and wrapped around  from the  left side posteriorly and then sutured not only to the allograft on  the right side but also to the diaphragm. It was nicely secured all the way  around the diaphragm. In addition, a flap of the mesh which remained from  the right side lay Roberts on the anterior portion of the stomach and it was  tacked with two sutures. Again using the Endostitch and Surgidek. The  resultant reduction and repair looked good and the diaphragmatic defect  seemed to be completely obliterated and covered.   Everything else looked to be in order. In the midst of all this, I had  endoscoped the patient from above with the flexible endoscope. I recognized  the GE junction at 40 cm and identified it well in the abdomen. I looked at  the 270 wrap and could see that posteriorly and it looked like she had a  good intact GE junction so I did not try to rewrap her. I also insufflated  and did not see any bubbles or any endoscopic evidence of perforation.   The patient  seemed to tolerate this 5-hour and 20-minute operation without  difficulties and was taken to the recovery room. She will be carried to the  Carol Roberts with status monitoring postop.      Thornton Park Daphine Deutscher, MD  Electronically Signed     MBM/MEDQ  D:  02/28/2006  T:  03/01/2006  Job:  [id number]   cc:   C. Duane Lope, M.D.  Fax: [phone]   Graylin Shiver, M.D.  Fax: [phone]

## 2013-01-12 DIAGNOSIS — J069 Acute upper respiratory infection, unspecified: Secondary | ICD-10-CM | POA: Diagnosis not present

## 2013-01-12 DIAGNOSIS — R0982 Postnasal drip: Secondary | ICD-10-CM | POA: Diagnosis not present

## 2013-04-09 DIAGNOSIS — K219 Gastro-esophageal reflux disease without esophagitis: Secondary | ICD-10-CM | POA: Diagnosis not present

## 2013-04-09 DIAGNOSIS — R1013 Epigastric pain: Secondary | ICD-10-CM | POA: Diagnosis not present

## 2013-04-09 DIAGNOSIS — K449 Diaphragmatic hernia without obstruction or gangrene: Secondary | ICD-10-CM | POA: Diagnosis not present

## 2013-04-24 ENCOUNTER — Other Ambulatory Visit: Payer: Self-pay | Admitting: Gastroenterology

## 2013-04-24 DIAGNOSIS — R1013 Epigastric pain: Secondary | ICD-10-CM

## 2013-05-08 ENCOUNTER — Ambulatory Visit
Admission: RE | Admit: 2013-05-08 | Discharge: 2013-05-08 | Disposition: A | Discharge status: Home / Self Care | Payer: Medicare Other | Source: Ambulatory Visit | Attending: Gastroenterology | Admitting: Gastroenterology

## 2013-05-08 DIAGNOSIS — K311 Adult hypertrophic pyloric stenosis: Secondary | ICD-10-CM | POA: Diagnosis not present

## 2013-05-08 DIAGNOSIS — R1013 Epigastric pain: Secondary | ICD-10-CM

## 2013-05-08 DIAGNOSIS — K449 Diaphragmatic hernia without obstruction or gangrene: Secondary | ICD-10-CM | POA: Diagnosis not present

## 2013-05-14 DIAGNOSIS — K449 Diaphragmatic hernia without obstruction or gangrene: Secondary | ICD-10-CM | POA: Diagnosis not present

## 2013-05-25 ENCOUNTER — Telehealth (INDEPENDENT_AMBULATORY_CARE_PROVIDER_SITE_OTHER): Payer: Self-pay | Admitting: General Surgery

## 2013-05-25 ENCOUNTER — Ambulatory Visit (INDEPENDENT_AMBULATORY_CARE_PROVIDER_SITE_OTHER): Payer: Medicare Other | Admitting: Surgery

## 2013-05-25 ENCOUNTER — Encounter (INDEPENDENT_AMBULATORY_CARE_PROVIDER_SITE_OTHER): Payer: Self-pay | Admitting: Surgery

## 2013-05-25 VITALS — BP 130/72 | HR 82 | Temp 97.3°F | Resp 14 | Ht 61.5 in | Wt 144.4 lb

## 2013-05-25 DIAGNOSIS — K449 Diaphragmatic hernia without obstruction or gangrene: Secondary | ICD-10-CM | POA: Diagnosis not present

## 2013-05-25 NOTE — Telephone Encounter (Signed)
Called [facility] to make them aware patient needs an EGD and esophageal manometry scheduled with Dr Evette Cristal. Spoke with Toni Amend and she will get message to Dr Luan Moore nurse Carollee Herter. They will contact patient directly to set up testing and call us back with appt dates. Awaiting call back.

## 2013-05-25 NOTE — Progress Notes (Signed)
Subjective:     Patient ID: Carol Roberts, female   DOB: [birth year], 67 y.o.   MRN: [id number]  HPI  Carol Roberts  Apr 12, 1946 [id number]  Patient Care Team: Graylin Shiver, MD as Consulting Physician (Gastroenterology)  This patient is a 67 y.o.female who presents today for surgical evaluation at the request of Dr. Evette Cristal.   Reason for visit: Recurrent Paraesophageal hiatal hernia with distal stomach incarcerated  Pleasant obese female with numerous abdominal surgeries.  She had initial INR and repair done laparoscopically in 2005.  She developed a recurrence.  This was repaired laparoscopically in 2007 by my senior partner, Dr. Daphine Deutscher.  He had used biologic mesh as a bridge over the defect as the hiatal defect was large.  She had pretty good results with this.  No more heartburn or reflux.    However, last fall she began to have some issues of abdominal discomfort and nausea.  Began worsening with vomiting.  Worsening reflux.  Became more intense.  Discussed with her gastroenterologist.  Upper GI done.  Antrum and pylorus herniated up through what appears to be a separate defect.  Placed on proton pump inhibitors and H2 blockers.  She is feeling better now On maximal medical therapy.  However, she can only really tolerate shakes and a few bites of solid food.  Not her normal baseline.  She has lost a lot of weight intentionally over the past two years.  However now she is losing much more aggressively given her poor oral intake.  No major hematemesis.  Based on concerns, she was sent to [place] for surgical evaluation.  I performed a ventral hernia repair in her as the last operation.  She wished to be seen by me.  Patient Active Problem List   Diagnosis Date Noted  . Paraesophageal hiatal hernia - recurrent 05/25/2013    Past Medical History  Diagnosis Date  . Arthritis   . GERD (gastroesophageal reflux disease)     Past Surgical History  Procedure Laterality Date  . Colostomy  closure  2010  . Hiatal hernia repair  2005    [place]  . Hiatal hernia repair  2007    Lap with MTF bridge mesh.  Dr Daphine Deutscher,   . Ventral hernia repair  09/06/2010  . Laparoscopic incisional / umbilical / ventral hernia repair  11/2010    lap VWH with mesh  . Left colectomy  2010    with colostomy for perforated diverticulitis    History   Social History  . Marital Status: Widowed    Spouse Name: N/A    Number of Children: N/A  . Years of Education: N/A   Occupational History  . Not on file.   Social History Main Topics  . Smoking status: Never Smoker   . Smokeless tobacco: Never Used  . Alcohol Use: No  . Drug Use: No  . Sexually Active: Not on file   Other Topics Concern  . Not on file   Social History Narrative  . No narrative on file    History reviewed. No pertinent family history.  Current Outpatient Prescriptions  Medication Sig Dispense Refill  . famotidine (PEPCID) 20 MG tablet Take 20 mg by mouth 2 (two) times daily.      Marland Kitchen FIBER-VITAMINS-MINERALS PO Take by mouth.      . Multiple Vitamins-Minerals (MULTIVITAMIN WITH MINERALS) tablet Take 1 tablet by mouth daily.      . pantoprazole (PROTONIX) 40 MG tablet  No current facility-administered medications for this visit.     No Known Allergies  BP 130/72  Pulse 82  Temp(Src) 97.3 F (36.3 C) (Temporal)  Resp 14  Ht 5' 1.5" (1.562 m)  Wt 144 lb 6.4 oz (65.499 kg)  BMI 26.85 kg/m2  Dg Ugi W/high Density W/kub  05/08/2013   *RADIOLOGY REPORT*  Clinical Data:Epigastric pain  UPPER GI SERIES W/HIGH DENSITY W/KUB  Technique: After obtaining a scout radiograph, upper GI series performed with high density barium and effervescent agent. Thin barium also used.  Fluoroscopy Time: 3 minutes 54 seconds  Comparison: CT abdomen pelvis of 10/12/2010 and upper GI of 03/01/2006  Findings: A preliminary film of the abdomen shows a nonspecific bowel gas pattern.  Multiple mesh coils are noted in the left  abdomen and overlying the pelvis.  There is degenerative change noted in the lower lumbar spine.  A double contrast study was performed.  There is suboptimal coating of the esophageal mucosa, but no mucosal abnormality is seen.  The swallowing mechanism is unremarkable.  There are moderate tertiary contractions within the mid and distal esophagus. However, there has been definite interval change since the prior upper GI and CT of the abdomen pelvis.  The esophagus extends into the fundus of the stomach which lies below the hemidiaphragm. However the mid and distal stomach protrudes into a hernia sac paraesophageal location lying above the diaphragm.  There is considerable delay in passage of barium through the hernia defect into the duodenum.  The patient walked around our facility for approximately 1/2 hour, and was then re- imaged.  At that time, this is a small amount of barium passing through the diaphragmatic defect into the duodenum which lies below the hemidiaphragm. Therefore, there is a partial gastric obstruction as a result of this hernia of the distal portion of the stomach above the hemidiaphragm.  IMPRESSION:  1.  There is a new herniation of the distal half of the stomach above the hemidiaphragm, paraesophageal in location.  As a result there is a definite delay in passage of barium into the duodenum which lies below the hemidiaphragm, consistent with a partial gastric outlet obstruction. 2.  Tertiary contractions in the mid and distal esophagus.   Original Report Authenticated By: Dwyane Dee, M.D.     Review of Systems  Constitutional: Negative for fever, chills, diaphoresis, appetite change and fatigue.  HENT: Negative for ear pain, sore throat, trouble swallowing, neck pain and ear discharge.   Eyes: Negative for photophobia, discharge and visual disturbance.  Respiratory: Negative for cough, choking, chest tightness and shortness of breath.   Cardiovascular: Negative for chest pain and  palpitations.  Gastrointestinal: Positive for nausea, vomiting, abdominal pain and constipation. Negative for diarrhea, anal bleeding and rectal pain.  Endocrine: Negative for cold intolerance and heat intolerance.  Genitourinary: Negative for dysuria, frequency and difficulty urinating.  Musculoskeletal: Negative for myalgias and gait problem.  Skin: Negative for color change, pallor and rash.  Allergic/Immunologic: Negative for environmental allergies, food allergies and immunocompromised state.  Neurological: Negative for dizziness, speech difficulty, weakness and numbness.  Hematological: Negative for adenopathy.  Psychiatric/Behavioral: Negative for confusion and agitation. The patient is not nervous/anxious.        Objective:   Physical Exam  Constitutional: She is oriented to person, place, and time. She appears well-developed and well-nourished. No distress.  HENT:  Head: Normocephalic.  Mouth/Throat: Oropharynx is clear and moist. No oropharyngeal exudate.  Eyes: Conjunctivae and EOM are normal. Pupils are  equal, round, and reactive to light. No scleral icterus.  Neck: Normal range of motion. Neck supple. No tracheal deviation present.  Cardiovascular: Normal rate, regular rhythm and intact distal pulses.   Pulmonary/Chest: Effort normal and breath sounds normal. No stridor. No respiratory distress. She exhibits no tenderness.  Abdominal: Soft. She exhibits no distension and no mass. There is tenderness. There is no rigidity, no rebound, no guarding, no CVA tenderness, no tenderness at McBurney's point and negative Murphy's sign. Hernia confirmed negative in the ventral area, confirmed negative in the right inguinal area and confirmed negative in the left inguinal area.    Genitourinary: No vaginal discharge found.  Musculoskeletal: Normal range of motion. She exhibits no tenderness.       Right elbow: She exhibits normal range of motion.       Left elbow: She exhibits normal  range of motion.       Right wrist: She exhibits normal range of motion.       Left wrist: She exhibits normal range of motion.       Right hand: Normal strength noted.       Left hand: Normal strength noted.  Lymphadenopathy:       Head (right side): No posterior auricular adenopathy present.       Head (left side): No posterior auricular adenopathy present.    She has no cervical adenopathy.    She has no axillary adenopathy.       Right: No inguinal adenopathy present.       Left: No inguinal adenopathy present.  Neurological: She is alert and oriented to person, place, and time. No cranial nerve deficit. She exhibits normal muscle tone. Coordination normal.  Skin: Skin is warm and dry. No rash noted. She is not diaphoretic. No erythema.  Psychiatric: She has a normal mood and affect. Her behavior is normal. Judgment and thought content normal.       Assessment:     Recurrent hiatal hernia.  Atypical paraesophageal presentation with antrum and duodenal bulb herniating  Resulting pain and dysphasia.  Better controlled with liquid only diet and high dose reflux medicine     Plan:     Plans think says that she will need surgery to correct her anatomy.  However, I would complete the workup.  Upper endoscopy and manometry.  Make sure that she does not have any esophageal pathology as well.  At least it is sensitive she has dysmotility.  I may have to take down her fundoplication to redo it.  If she has dysmotility, may have to hold off on aggressive wrap.  I may have to do a relaxing incision on the diaphragm to help close the hiatus better.  Biologic mesh around the esophagus and permanent mesh over the relaxing incision.  She understands:  The anatomy & physiology of the foregut and anti-reflux mechanism was discussed.  The pathophysiology of hiatal herniation and GERD was discussed.  Natural history risks without surgery was discussed.   The patient's symptoms are not adequately controlled  by medicines and other non-operative treatments.  I feel the risks of no intervention will lead to serious problems that outweigh the operative risks; therefore, I recommended surgery to reduce the hiatal hernia out of the chest and fundoplication to rebuild the anti-reflux valve and control reflux better.  Need for a thorough workup to rule out the differential diagnosis and plan treatment was explained.  I explained laparoscopic techniques with possible need for an open approach.  Risks such as bleeding, infection, abscess, leak, need for further treatment, heart attack, death, and other risks were discussed.   I noted a good likelihood this will help address the problem.  Goals of post-operative recovery were discussed as well.  Possibility that this will not correct all symptoms was explained.  Post-operative dysphagia, need for short-term liquid & pureed diet, inability to vomit, possibility of reherniation, possible need for medicines to help control symptoms in addition to surgery were discussed.  We will work to minimize complications.   Educational handouts further explaining the pathology, treatment options, and dysphagia diet was given as well.  Questions were answered.  The patient expresses understanding & wishes to proceed with surgery.  I cautioned her that she is at risk for esophageal and ,gastric perforation and other issues.  It took a while with the last surgery.  This may take even longer.  Possible ICU stay.  Chest tubes mediastinal tubes, abdominal drain/injury as well.  The other option is to stick on the liquid only diet gradually lanced.  Continue the high-dose proton pump inhibitor and H2 blocker.  She does not feel this is a good permanent situation.  The episodes of nausea vomiting and pain have been intense and concerned her.  She wishes to proceed with surgery.  I discussed at length.  I discussed with my partner, Dr. Derrell Lolling, whom has also had extensive laparoscopic training.   We will discuss with Dr. Daphine Deutscher as well.   Ardeth Sportsman, M.D., F.A.C.S. Gastrointestinal and Minimally Invasive Surgery [facility] [address] [place], [place] [address] [phone] Main / Paging

## 2013-05-25 NOTE — Patient Instructions (Addendum)
See the Handout(s) we gave you.  Consider surgery.  We will need upper endoscopy and esophageal manometry to assess anatomy and function of the region.  Please call our office at [phone] if you wish to schedule surgery or if you have further questions / concerns.   Hiatal Hernia A hiatal hernia occurs when a part of the stomach slides above the diaphragm. The diaphragm is the thin muscle separating the belly (abdomen) from the chest. A hiatal hernia can be something you are born with or develop over time. Hiatal hernias may allow stomach acid to flow back into your esophagus, the tube which carries food from your mouth to your stomach. If this acid causes problems it is called GERD (gastro-esophageal reflux disease).  SYMPTOMS  Common symptoms of GERD are heartburn (burning in your chest). This is worse when lying down or bending over. It may also cause belching and indigestion. Some of the things which make GERD worse are:  Increased weight pushes on stomach making acid rise more easily.  Smoking markedly increases acid production.  Alcohol decreases lower esophageal sphincter pressure (valve between stomach and esophagus), allowing acid from stomach into esophagus.  Late evening meals and going to bed with a full stomach increases pressure.  Anything that causes an increase in acid production.  Lower esophageal sphincter incompetence. DIAGNOSIS  Hiatal hernia is often diagnosed with x-rays of your stomach and small bowel. This is called an UGI (upper gastrointestinal x-ray). Sometimes a gastroscopic procedure is done. This is a procedure where your caregiver uses a flexible instrument to look into the stomach and small bowel. HOME CARE INSTRUCTIONS   Try to achieve and maintain an ideal body weight.  Avoid drinking alcoholic beverages.  Stop smoking.  Put the head of your bed on 4 to 6 inch blocks. This will keep your head and esophagus higher than your stomach. If you cannot  use blocks, sleep with several pillows under your head and shoulders.  Over-the-counter medications will decrease acid production. Your caregiver can also prescribe medications for this. Take as directed.  1/2 to 1 teaspoon of an antacid taken every hour while awake, with meals and at bedtime, will neutralize acid.  Do not take aspirin, ibuprofen (Advil or Motrin), or other nonsteroidal anti-inflammatory drugs.  Do not wear tight clothing around your chest or stomach.  Eat smaller meals and eat more frequently. This keeps your stomach from getting too full. Eat slowly.  Do not lie down for 2 or 3 hours after eating. Do not eat or drink anything 1 to 2 hours before going to bed.  Avoid caffeine beverages (colas, coffee, cocoa, tea), fatty foods, citrus fruits and all other foods and drinks that contain acid and that seem to increase the problems.  Avoid bending over, especially after eating. Also avoid straining during bowel movements or when urinating or lifting things. Anything that increases the pressure in your belly increases the amount of acid that may be pushed up into your esophagus. SEEK IMMEDIATE MEDICAL CARE IF:  There is change in location (pain in arms, neck, jaw, teeth or back) of your pain, or the pain is getting worse.  You also experience nausea, vomiting, sweating (diaphoresis), or shortness of breath.  You develop continual vomiting, vomit blood or coffee ground material, have bright red blood in your stools, or have black tarry stools. Some of these symptoms could signal other problems such as heart disease. MAKE SURE YOU:   Understand these instructions.  Monitor your  condition.  Contact your caregiver if you are not doing well or are getting worse. Document Released: 02/16/2004 Document Revised: 02/18/2012 Document Reviewed: 11/26/2005 [facility] Patient Information 2014 [place], [place].  EATING AFTER YOUR ESOPHAGEAL SURGERY (Stomach Fundoplication, Hiatal  Hernia repair, Achalasia surgery, etc)  After your esophageal surgery, expect some sticking with swallowing over the next 1-2 months.    If food sticks when you eat, it is called "dysphagia".  This is due to swelling around your esophagus at the wrap & hiatal diaphragm repair.  It will gradually ease off over the next few months.  To help you through this temporary phase, we start you out on a pureed (blenderized) diet.  Your first meal in the hospital was thin liquids.  You should have been given a pureed diet by the time you left the hospital.  We ask patients to stay on a pureed diet for the first 2-3 weeks to avoid anything getting "stuck" near your recent surgery.  Don't be alarmed if your ability to swallow doesn't progress according to this plan.  Everyone is different and some diets can advance more or less quickly.     Some BASIC RULES to follow are:  Maintain an upright position whenever eating or drinking.  Take small bites - just a teaspoon size bite at a time.  Eat slowly.  It may also help to eat only one food at a time.  Consider nibbling through smaller, more frequent meals & avoid the urge to eat BIG meals  Do not push through feelings of fullness, nausea, or bloatedness  Do not mix solid foods and liquids in the same mouthful  Try not to "wash foods down" with large gulps of liquids. Avoid carbonated (bubbly/fizzy) drinks.  Understand that it will be hard to burp and belch at first.  This gradually improves with time.  Expect to be more gassy/flatulent/bloated initially.  Walking will help you work through that.  Maalox/Gas-X can help as well.  Eat in a relaxed atmosphere & minimize distractions.  Avoid talking while eating.    Do not use straws.  Following each meal, sit in an upright position (90 degree angle) for 60 to 90 minutes.  Going for a short walk can help as well  If food does stick, don't panic.  Try to relax and let the food pass on its own.  Sipping  WARM LIQUID such as strong hot black tea can also help slide it down.   Be gradual in changes & use common sense:  -If you easily tolerating a certain "level" of foods, advance to the next level gradually -If you are having trouble swallowing a particular food, then avoid it.   -If food is sticking when you advance your diet, go back to thinner previous diet (the lower LEVEL) for 1-2 days.  LEVEL 1 = PUREED DIET  Do for the first 2 WEEKS AFTER SURGERY  -Foods in this group are pureed or blenderized to a smooth, mashed potato-like consistency.  -If necessary, the pureed foods can keep their shape with the addition of a thickening agent.   -Meat should be pureed to a smooth, pasty consistency.  Hot broth or gravy may be added to the pureed meat, approximately 1 oz. of liquid per 3 oz. serving of meat. -CAUTION:  If any foods do not puree into a smooth consistency, swallowing will be more difficult.  (For example, nuts or seeds sometimes do not blend well.)  Hot Foods Cold Foods  Pureed scrambled  eggs and cheese Pureed cottage cheese  Baby cereals Thickened juices and nectars  Thinned cooked cereals (no lumps) Thickened milk or eggnog  Pureed [place] toast or pancakes Ensure  Mashed potatoes Ice cream  Pureed parsley, au gratin, scalloped potatoes, candied sweet potatoes Fruit or [place] ice, sherbet  Pureed buttered or alfredo noodles Plain yogurt  Pureed vegetables (no corn or peas) Instant breakfast  Pureed soups and creamed soups Smooth pudding, mousse, custard  Pureed scalloped apples Whipped gelatin  Gravies Sugar, syrup, honey, jelly  Sauces, cheese, tomato, barbecue, white, creamed Cream  Any baby food Creamer  Alcohol in moderation (not beer or champagne) Margarine  Coffee or tea Mayonnaise   Ketchup, mustard   Apple sauce   SAMPLE MENU:  PUREED DIET Breakfast Lunch Dinner   Orange juice, 1/2 cup  Cream of wheat, 1/2 cup  Pineapple juice, 1/2 cup  Pureed [place],  barley soup, 3/4 cup  Pureed Hawaiian chicken, 3 oz   Scrambled eggs, mashed or blended with cheese, 1/2 cup  Tea or coffee, 1 cup   Whole milk, 1 cup   Non-dairy creamer, 2 Tbsp.  Mashed potatoes, 1/2 cup  Pureed cooled broccoli, 1/2 cup  Apple sauce, 1/2 cup  Coffee or tea  Mashed potatoes, 1/2 cup  Pureed spinach, 1/2 cup  Frozen yogurt, 1/2 cup  Tea or coffee      LEVEL 2 = SOFT DIET  After your first 2 weeks, you can advance to a soft diet.   Keep on this diet until everything goes down easily.  Hot Foods Cold Foods  White fish Cottage cheese  Stuffed fish Junior baby fruit  Baby food meals Semi thickened juices  Minced soft cooked, scrambled, poached eggs nectars  Souffle & omelets Ripe mashed bananas  Cooked cereals Canned fruit, pineapple sauce, milk  potatoes Milkshake  Buttered or Alfredo noodles Custard  Cooked cooled vegetable Puddings, including tapioca  Sherbet Yogurt  Vegetable soup or alphabet soup Fruit ice, [place] ice  Gravies Whipped gelatin  Sugar, syrup, honey, jelly Junior baby desserts  Sauces:  Cheese, creamed, barbecue, tomato, white Cream  Coffee or tea Margarine   SAMPLE MENU:  LEVEL 2 Breakfast Lunch Dinner   Orange juice, 1/2 cup  Oatmeal, 1/2 cup  Scrambled eggs with cheese, 1/2 cup  Decaffeinated tea, 1 cup  Whole milk, 1 cup  Non-dairy creamer, 2 Tbsp  Pineapple juice, 1/2 cup  Minced beef, 3 oz  Gravy, 2 Tbsp  Mashed potatoes, 1/2 cup  Minced fresh broccoli, 1/2 cup  Applesauce, 1/2 cup  Coffee, 1 cup  [place], barley soup, 3/4 cup  Minced Hawaiian chicken, 3 oz  Mashed potatoes, 1/2 cup  Cooked spinach, 1/2 cup  Frozen yogurt, 1/2 cup  Non-dairy creamer, 2 Tbsp      LEVEL 3 = CHOPPED DIET  -After all the foods in level 2 (soft diet) are passing through well you should advance up to more chopped foods.  -It is still important to cut these foods into small pieces and eat slowly.  Hot  Foods Cold Foods  Poultry Cottage cheese  Chopped Swedish meatballs Yogurt  Meat salads (ground or flaked meat) Milk  Flaked fish (tuna) Milkshakes  Poached or scrambled eggs Soft, cold, dry cereal  Souffles and omelets Fruit juices or nectars  Cooked cereals Chopped canned fruit  Chopped [place] toast or pancakes Canned fruit cocktail  Noodles or pasta (no rice) Pudding, mousse, custard  Cooked vegetables (no frozen peas, corn, or mixed  vegetables) Green salad  Canned small sweet peas Ice cream  Creamed soup or vegetable soup Fruit ice, [place] ice  Pureed vegetable soup or alphabet soup Non-dairy creamer  Ground scalloped apples Margarine  Gravies Mayonnaise  Sauces:  Cheese, creamed, barbecue, tomato, white Ketchup  Coffee or tea Mustard   SAMPLE MENU:  LEVEL 3 Breakfast Lunch Dinner   Orange juice, 1/2 cup  Oatmeal, 1/2 cup  Scrambled eggs with cheese, 1/2 cup  Decaffeinated tea, 1 cup  Whole milk, 1 cup  Non-dairy creamer, 2 Tbsp  Ketchup, 1 Tbsp  Margarine, 1 tsp  Salt, 1/4 tsp  Sugar, 2 tsp  Pineapple juice, 1/2 cup  Ground beef, 3 oz  Gravy, 2 Tbsp  Mashed potatoes, 1/2 cup  Cooked spinach, 1/2 cup  Applesauce, 1/2 cup  Decaffeinated coffee  Whole milk  Non-dairy creamer, 2 Tbsp  Margarine, 1 tsp  Salt, 1/4 tsp  Pureed [place], barley soup, 3/4 cup  Barbecue chicken, 3 oz  Mashed potatoes, 1/2 cup  Ground fresh broccoli, 1/2 cup  Frozen yogurt, 1/2 cup  Decaffeinated tea, 1 cup  Non-dairy creamer, 2 Tbsp  Margarine, 1 tsp  Salt, 1/4 tsp  Sugar, 1 tsp    LEVEL 4:  REGULAR FOODS  -Foods in this group are soft, moist, regularly textured foods.   -This level includes meat and breads, which tend to be the hardest things to swallow.   -Eat very slowly, chew well and continue to avoid carbonated drinks. -most people are at this level in 4-6 weeks  Hot Foods Cold Foods  Baked fish or skinned Soft cheeses - cottage cheese    Souffles and omelets Cream cheese  Eggs Yogurt  Stuffed shells Milk  Spaghetti with meat sauce Milkshakes  Cooked cereal Cold dry cereals (no nuts, dried fruit, coconut)  [place] toast or pancakes Crackers  Buttered toast Fruit juices or nectars  Noodles or pasta (no rice) Canned fruit  Potatoes (all types) Ripe bananas  Soft, cooked vegetables (no corn, lima, or baked beans) Peeled, ripe, fresh fruit  Creamed soups or vegetable soup Cakes (no nuts, dried fruit, coconut)  Canned chicken noodle soup Plain doughnuts  Gravies Ice cream  Bacon dressing Pudding, mousse, custard  Sauces:  Cheese, creamed, barbecue, tomato, white Fruit ice, [place] ice, sherbet  Decaffeinated tea or coffee Whipped gelatin  Pork chops Regular gelatin   Canned fruited gelatin molds   Sugar, syrup, honey, jam, jelly   Cream   Non-dairy   Margarine   Oil   Mayonnaise   Ketchup   Mustard    If you have any questions please call our office at [facility]: [phone].

## 2013-05-27 NOTE — Telephone Encounter (Signed)
Carollee Herter called back to make [place] aware Manometry set up for 06/08/2013 and EGD set up for 06/16/2013.

## 2013-06-08 ENCOUNTER — Ambulatory Visit (HOSPITAL_COMMUNITY)
Admission: RE | Admit: 2013-06-08 | Discharge: 2013-06-08 | Disposition: A | Discharge status: Home / Self Care | Payer: Medicare Other | Source: Ambulatory Visit | Attending: Gastroenterology | Admitting: Gastroenterology

## 2013-06-08 ENCOUNTER — Encounter (HOSPITAL_COMMUNITY)
Admission: RE | Disposition: A | Discharge status: Home / Self Care | Payer: Self-pay | Source: Ambulatory Visit | Attending: Gastroenterology

## 2013-06-08 DIAGNOSIS — K219 Gastro-esophageal reflux disease without esophagitis: Secondary | ICD-10-CM | POA: Diagnosis not present

## 2013-06-08 DIAGNOSIS — R1314 Dysphagia, pharyngoesophageal phase: Secondary | ICD-10-CM | POA: Diagnosis not present

## 2013-06-08 DIAGNOSIS — R0789 Other chest pain: Secondary | ICD-10-CM | POA: Insufficient documentation

## 2013-06-08 HISTORY — PX: ESOPHAGEAL MANOMETRY: SHX5429

## 2013-06-08 SURGERY — MANOMETRY, ESOPHAGUS

## 2013-06-08 MED ORDER — LIDOCAINE VISCOUS 2 % MT SOLN
OROMUCOSAL | Status: AC
  Filled 2013-06-08: qty 15

## 2013-06-09 ENCOUNTER — Encounter (HOSPITAL_COMMUNITY): Payer: Self-pay | Admitting: Gastroenterology

## 2013-06-16 DIAGNOSIS — R1013 Epigastric pain: Secondary | ICD-10-CM | POA: Diagnosis not present

## 2013-06-16 DIAGNOSIS — R112 Nausea with vomiting, unspecified: Secondary | ICD-10-CM | POA: Diagnosis not present

## 2013-06-16 DIAGNOSIS — K449 Diaphragmatic hernia without obstruction or gangrene: Secondary | ICD-10-CM | POA: Diagnosis not present

## 2013-06-16 DIAGNOSIS — R933 Abnormal findings on diagnostic imaging of other parts of digestive tract: Secondary | ICD-10-CM | POA: Diagnosis not present

## 2013-06-17 ENCOUNTER — Other Ambulatory Visit (INDEPENDENT_AMBULATORY_CARE_PROVIDER_SITE_OTHER): Payer: Self-pay | Admitting: Surgery

## 2013-06-17 ENCOUNTER — Telehealth (INDEPENDENT_AMBULATORY_CARE_PROVIDER_SITE_OTHER): Payer: Self-pay

## 2013-06-17 ENCOUNTER — Encounter (INDEPENDENT_AMBULATORY_CARE_PROVIDER_SITE_OTHER): Payer: Self-pay | Admitting: Surgery

## 2013-06-17 NOTE — Telephone Encounter (Signed)
Carollee Herter returned my call to let me know that Dr Bosie Clos is having Dr Jarold Motto read the [facility] results and then they will let us know. It might be next week before we hear back from their office but they will contact the Carol Roberts and then [place] with the results. I will notify the Carol Roberts.

## 2013-06-17 NOTE — Telephone Encounter (Signed)
LMOM for pt letting her know that we do not have the results back yet on the [facility] test but once we have them we will be in touch with her.

## 2013-06-17 NOTE — H&P (Signed)
Patient ready for surgery.  Manometry done.  Results pending.  Will post her redo fundoplication.  Results should be back soon.  Plan endoscopy given its complexity as well.

## 2013-06-17 NOTE — Telephone Encounter (Signed)
Called to check on results of the manommetry that was done on the pt 06/08/13. Marchelle Folks is checking with Dr Marge Duncans nurse Carollee Herter b/c he was the physician going to read the report.

## 2013-06-26 NOTE — Telephone Encounter (Signed)
Called pt to notify her that I just got off the phone with Dr Marge Duncans office with the [employer] results and I checked to make sure she was notified with the negative results. The pt has been made aware. The pt has a surgery date in place for 8/22 by Dr Michaell Cowing. The pt is ok with the plan. I will notify Dr Michaell Cowing.

## 2013-06-26 NOTE — Telephone Encounter (Signed)
Called to check on the status of the manommetry results. Carollee Herter told me they just got the results back from being read by Dr Jarold Motto and the results are negative. They have notified the pt of the results and are faxing me a copy of the test.

## 2013-07-02 ENCOUNTER — Encounter (INDEPENDENT_AMBULATORY_CARE_PROVIDER_SITE_OTHER): Payer: Self-pay

## 2013-07-15 ENCOUNTER — Other Ambulatory Visit: Payer: Self-pay

## 2013-07-17 ENCOUNTER — Encounter (HOSPITAL_COMMUNITY): Payer: Self-pay | Admitting: Pharmacy Technician

## 2013-07-23 ENCOUNTER — Encounter (HOSPITAL_COMMUNITY): Payer: Self-pay

## 2013-07-23 ENCOUNTER — Encounter (HOSPITAL_COMMUNITY)
Admission: RE | Admit: 2013-07-23 | Discharge: 2013-07-23 | Disposition: A | Discharge status: Home / Self Care | Payer: Medicare Other | Source: Ambulatory Visit | Attending: Surgery | Admitting: Surgery

## 2013-07-23 ENCOUNTER — Ambulatory Visit (HOSPITAL_COMMUNITY)
Admission: RE | Admit: 2013-07-23 | Discharge: 2013-07-23 | Disposition: A | Discharge status: Home / Self Care | Payer: Medicare Other | Source: Ambulatory Visit | Attending: Surgery | Admitting: Surgery

## 2013-07-23 DIAGNOSIS — I700 Atherosclerosis of aorta: Secondary | ICD-10-CM | POA: Diagnosis not present

## 2013-07-23 DIAGNOSIS — K449 Diaphragmatic hernia without obstruction or gangrene: Secondary | ICD-10-CM | POA: Diagnosis not present

## 2013-07-23 DIAGNOSIS — Z01810 Encounter for preprocedural cardiovascular examination: Secondary | ICD-10-CM | POA: Diagnosis not present

## 2013-07-23 HISTORY — DX: Personal history of other diseases of the digestive system: Z87.19

## 2013-07-23 HISTORY — DX: Headache: R51

## 2013-07-23 HISTORY — DX: Anemia, unspecified: D64.9

## 2013-07-23 HISTORY — DX: Diaphragmatic hernia without obstruction or gangrene: K44.9

## 2013-07-23 HISTORY — DX: Fibromyalgia: M79.7

## 2013-07-23 LAB — BASIC METABOLIC PANEL
BUN: 20 mg/dL (ref 6–23)
CO2: 32 mEq/L (ref 19–32)
Calcium: 9.8 mg/dL (ref 8.4–10.5)
Creatinine, Ser: 0.79 mg/dL (ref 0.50–1.10)
GFR calc non Af Amer: 86 mL/min — ABNORMAL LOW (ref >90)
Glucose, Bld: 93 mg/dL (ref 70–99)

## 2013-07-23 LAB — CBC
HCT: 40.6 % (ref 36.0–46.0)
Hemoglobin: 13.8 g/dL (ref 12.0–15.0)
MCH: 31.5 pg (ref 26.0–34.0)
MCHC: 34 g/dL (ref 30.0–36.0)
MCV: 92.7 fL (ref 78.0–100.0)
RBC: 4.38 MIL/uL (ref 3.87–5.11)

## 2013-07-23 MED ORDER — CHLORHEXIDINE GLUCONATE 4 % EX LIQD: 1.0000 "application " | Freq: Once | CUTANEOUS | Status: DC

## 2013-07-23 NOTE — Pre-Procedure Instructions (Signed)
EKG AND CXR WERE DONE TODAY - PREOP AT [facility]. 

## 2013-07-23 NOTE — Patient Instructions (Signed)
YOUR SURGERY IS SCHEDULED AT [facility]  ON: Friday  8/22  REPORT TO [place] SHORT STAY [facility] AT: 5:15 AM      PHONE # FOR SHORT STAY IS [phone]  DO NOT EAT OR DRINK ANYTHING AFTER MIDNIGHT THE NIGHT BEFORE YOUR SURGERY.  YOU MAY BRUSH YOUR TEETH, RINSE OUT YOUR MOUTH--BUT NO WATER, NO FOOD, NO CHEWING GUM, NO MINTS, NO CANDIES, NO CHEWING TOBACCO.  PLEASE TAKE THE FOLLOWING MEDICATIONS THE AM OF YOUR SURGERY WITH A FEW SIPS OF WATER:  PEPCID, PANTOPRAZOLE    DO NOT BRING VALUABLES, MONEY, CREDIT CARDS.  DO NOT WEAR JEWELRY, MAKE-UP, NAIL POLISH AND NO METAL PINS OR CLIPS IN YOUR HAIR. CONTACT LENS, DENTURES / PARTIALS, GLASSES SHOULD NOT BE WORN TO SURGERY AND IN MOST CASES-HEARING AIDS WILL NEED TO BE REMOVED.  BRING YOUR GLASSES CASE, ANY EQUIPMENT NEEDED FOR YOUR CONTACT LENS. FOR PATIENTS ADMITTED TO THE HOSPITAL--CHECK OUT TIME THE DAY OF DISCHARGE IS 11:00 AM.  ALL INPATIENT ROOMS ARE PRIVATE - WITH BATHROOM, TELEPHONE, TELEVISION AND WIFI INTERNET.   FAILURE TO FOLLOW THESE INSTRUCTIONS MAY RESULT IN THE CANCELLATION OF YOUR SURGERY.   PATIENT SIGNATURE_________________________________

## 2013-07-25 DIAGNOSIS — K449 Diaphragmatic hernia without obstruction or gangrene: Secondary | ICD-10-CM | POA: Diagnosis not present

## 2013-07-25 DIAGNOSIS — R112 Nausea with vomiting, unspecified: Secondary | ICD-10-CM | POA: Diagnosis not present

## 2013-07-31 ENCOUNTER — Ambulatory Visit (HOSPITAL_COMMUNITY): Payer: Medicare Other | Admitting: Anesthesiology

## 2013-07-31 ENCOUNTER — Inpatient Hospital Stay (HOSPITAL_COMMUNITY)
Admission: RE | Admit: 2013-07-31 | Discharge: 2013-08-02 | DRG: 328 | Disposition: A | Discharge status: Home / Self Care | Payer: Medicare Other | Source: Ambulatory Visit | Attending: Surgery | Admitting: Surgery

## 2013-07-31 ENCOUNTER — Encounter (HOSPITAL_COMMUNITY): Payer: Self-pay | Admitting: Anesthesiology

## 2013-07-31 ENCOUNTER — Encounter (HOSPITAL_COMMUNITY)
Admission: RE | Disposition: A | Discharge status: Home / Self Care | Payer: Self-pay | Source: Ambulatory Visit | Attending: Surgery

## 2013-07-31 ENCOUNTER — Encounter (HOSPITAL_COMMUNITY): Payer: Self-pay | Admitting: *Deleted

## 2013-07-31 DIAGNOSIS — K449 Diaphragmatic hernia without obstruction or gangrene: Secondary | ICD-10-CM | POA: Diagnosis not present

## 2013-07-31 DIAGNOSIS — K440 Diaphragmatic hernia with obstruction, without gangrene: Secondary | ICD-10-CM | POA: Diagnosis not present

## 2013-07-31 DIAGNOSIS — K660 Peritoneal adhesions (postprocedural) (postinfection): Secondary | ICD-10-CM | POA: Diagnosis not present

## 2013-07-31 DIAGNOSIS — Z9049 Acquired absence of other specified parts of digestive tract: Secondary | ICD-10-CM | POA: Diagnosis not present

## 2013-07-31 DIAGNOSIS — K219 Gastro-esophageal reflux disease without esophagitis: Secondary | ICD-10-CM | POA: Diagnosis not present

## 2013-07-31 SURGERY — REPAIR, HERNIA, PARAESOPHAGEAL, LAPAROSCOPIC
Anesthesia: General | Site: Abdomen | Wound class: Clean Contaminated

## 2013-07-31 MED ORDER — MAGNESIUM HYDROXIDE 400 MG/5ML PO SUSP: 30.0000 mL | Freq: Two times a day (BID) | ORAL | Status: DC | PRN

## 2013-07-31 MED ORDER — NEOSTIGMINE METHYLSULFATE 1 MG/ML IJ SOLN
INTRAMUSCULAR | Status: DC | PRN
  Administered 2013-07-31: 4 mg via INTRAVENOUS

## 2013-07-31 MED ORDER — ONDANSETRON HCL 4 MG/2ML IJ SOLN
INTRAMUSCULAR | Status: DC | PRN
  Administered 2013-07-31: 4 mg via INTRAVENOUS

## 2013-07-31 MED ORDER — OXYCODONE HCL 5 MG PO TABS
5.0000 mg | ORAL_TABLET | ORAL | Status: DC | PRN
  Administered 2013-08-01: 10 mg via ORAL
  Administered 2013-08-02: 5 mg via ORAL
  Filled 2013-07-31: qty 1
  Filled 2013-07-31: qty 2

## 2013-07-31 MED ORDER — DIPHENHYDRAMINE HCL 50 MG/ML IJ SOLN: 12.5000 mg | Freq: Four times a day (QID) | INTRAMUSCULAR | Status: DC | PRN

## 2013-07-31 MED ORDER — PROPOFOL 10 MG/ML IV BOLUS
INTRAVENOUS | Status: DC | PRN
  Administered 2013-07-31: 150 mg via INTRAVENOUS

## 2013-07-31 MED ORDER — MULTI-VITAMIN/MINERALS PO TABS: 1.0000 | ORAL_TABLET | Freq: Every day | ORAL | Status: DC

## 2013-07-31 MED ORDER — OXYCODONE HCL 5 MG PO TABS: 5.0000 mg | ORAL_TABLET | ORAL | Status: DC | PRN

## 2013-07-31 MED ORDER — BUPIVACAINE-EPINEPHRINE 0.25% -1:200000 IJ SOLN
INTRAMUSCULAR | Status: DC | PRN
  Administered 2013-07-31: 50 mL

## 2013-07-31 MED ORDER — ACETAMINOPHEN 500 MG PO TABS
1000.0000 mg | ORAL_TABLET | Freq: Three times a day (TID) | ORAL | Status: DC
  Administered 2013-07-31 – 2013-08-02 (×6): 1000 mg via ORAL
  Filled 2013-07-31 (×8): qty 2

## 2013-07-31 MED ORDER — ALUM & MAG HYDROXIDE-SIMETH 200-200-20 MG/5ML PO SUSP: 30.0000 mL | Freq: Four times a day (QID) | ORAL | Status: DC | PRN

## 2013-07-31 MED ORDER — FAMOTIDINE 20 MG PO TABS
20.0000 mg | ORAL_TABLET | Freq: Two times a day (BID) | ORAL | Status: DC
  Administered 2013-07-31 – 2013-08-02 (×4): 20 mg via ORAL
  Filled 2013-07-31 (×6): qty 1

## 2013-07-31 MED ORDER — ZOLPIDEM TARTRATE 5 MG PO TABS
5.0000 mg | ORAL_TABLET | Freq: Every evening | ORAL | Status: DC | PRN
  Administered 2013-08-02: 5 mg via ORAL
  Filled 2013-07-31: qty 1

## 2013-07-31 MED ORDER — PROMETHAZINE HCL 25 MG/ML IJ SOLN
12.5000 mg | Freq: Four times a day (QID) | INTRAMUSCULAR | Status: DC | PRN
  Administered 2013-07-31: 25 mg via INTRAVENOUS
  Filled 2013-07-31: qty 1

## 2013-07-31 MED ORDER — LACTATED RINGERS IV SOLN
INTRAVENOUS | Status: DC
  Administered 2013-07-31 – 2013-08-02 (×2): via INTRAVENOUS

## 2013-07-31 MED ORDER — PROMETHAZINE HCL 25 MG/ML IJ SOLN: 6.2500 mg | INTRAMUSCULAR | Status: DC | PRN

## 2013-07-31 MED ORDER — DEXTROSE 5 % IV SOLN
2.0000 g | Freq: Once | INTRAVENOUS | Status: AC
  Administered 2013-07-31: 2 g via INTRAVENOUS
  Filled 2013-07-31: qty 2

## 2013-07-31 MED ORDER — METOPROLOL TARTRATE 1 MG/ML IV SOLN
5.0000 mg | Freq: Four times a day (QID) | INTRAVENOUS | Status: DC | PRN
  Filled 2013-07-31: qty 5

## 2013-07-31 MED ORDER — HYDROMORPHONE HCL PF 1 MG/ML IJ SOLN
INTRAMUSCULAR | Status: DC | PRN
  Administered 2013-07-31 (×4): 0.5 mg via INTRAVENOUS

## 2013-07-31 MED ORDER — PANTOPRAZOLE SODIUM 40 MG PO TBEC
40.0000 mg | DELAYED_RELEASE_TABLET | Freq: Every day | ORAL | Status: DC
  Administered 2013-08-01 – 2013-08-02 (×2): 40 mg via ORAL
  Filled 2013-07-31 (×2): qty 1

## 2013-07-31 MED ORDER — LIP MEDEX EX OINT
1.0000 "application " | TOPICAL_OINTMENT | Freq: Two times a day (BID) | CUTANEOUS | Status: DC
  Administered 2013-07-31 – 2013-08-02 (×4): 1 via TOPICAL
  Filled 2013-07-31: qty 7

## 2013-07-31 MED ORDER — PHENYLEPHRINE HCL 10 MG/ML IJ SOLN
INTRAMUSCULAR | Status: DC | PRN
  Administered 2013-07-31: 80 ug via INTRAVENOUS

## 2013-07-31 MED ORDER — DEXTROSE 5 % IV SOLN
2.0000 g | INTRAVENOUS | Status: AC
  Administered 2013-07-31: 2 g via INTRAVENOUS

## 2013-07-31 MED ORDER — ONDANSETRON HCL 4 MG PO TABS: 4.0000 mg | ORAL_TABLET | Freq: Three times a day (TID) | ORAL | Status: DC | PRN

## 2013-07-31 MED ORDER — DEXAMETHASONE SODIUM PHOSPHATE 10 MG/ML IJ SOLN
INTRAMUSCULAR | Status: DC | PRN
  Administered 2013-07-31: 4 mg via INTRAVENOUS

## 2013-07-31 MED ORDER — BUPIVACAINE-EPINEPHRINE 0.25% -1:200000 IJ SOLN
INTRAMUSCULAR | Status: AC
  Filled 2013-07-31: qty 1

## 2013-07-31 MED ORDER — LIDOCAINE HCL (PF) 2 % IJ SOLN
INTRAMUSCULAR | Status: DC | PRN
  Administered 2013-07-31: 75 mg

## 2013-07-31 MED ORDER — MEPERIDINE HCL 50 MG/ML IJ SOLN: 6.2500 mg | INTRAMUSCULAR | Status: DC | PRN

## 2013-07-31 MED ORDER — SODIUM CHLORIDE 0.9 % IJ SOLN: 3.0000 mL | INTRAMUSCULAR | Status: DC | PRN

## 2013-07-31 MED ORDER — CEFOXITIN SODIUM-DEXTROSE 1-4 GM-% IV SOLR (PREMIX)
INTRAVENOUS | Status: AC
  Filled 2013-07-31: qty 100

## 2013-07-31 MED ORDER — MAGIC MOUTHWASH
15.0000 mL | Freq: Four times a day (QID) | ORAL | Status: DC | PRN
  Filled 2013-07-31: qty 15

## 2013-07-31 MED ORDER — FENTANYL CITRATE 0.05 MG/ML IJ SOLN
INTRAMUSCULAR | Status: AC
  Filled 2013-07-31: qty 2

## 2013-07-31 MED ORDER — CISATRACURIUM BESYLATE (PF) 10 MG/5ML IV SOLN
INTRAVENOUS | Status: DC | PRN
  Administered 2013-07-31: 2 mg via INTRAVENOUS
  Administered 2013-07-31: 4 mg via INTRAVENOUS
  Administered 2013-07-31: 2 mg via INTRAVENOUS
  Administered 2013-07-31: 6 mg via INTRAVENOUS
  Administered 2013-07-31: 10 mg via INTRAVENOUS
  Administered 2013-07-31 (×2): 4 mg via INTRAVENOUS

## 2013-07-31 MED ORDER — SODIUM CHLORIDE 0.9 % IJ SOLN
3.0000 mL | Freq: Two times a day (BID) | INTRAMUSCULAR | Status: DC
  Administered 2013-08-01 (×2): 3 mL via INTRAVENOUS

## 2013-07-31 MED ORDER — ADULT MULTIVITAMIN W/MINERALS CH
1.0000 | ORAL_TABLET | Freq: Every day | ORAL | Status: DC
  Administered 2013-07-31 – 2013-08-02 (×3): 1 via ORAL
  Filled 2013-07-31 (×3): qty 1

## 2013-07-31 MED ORDER — SUCCINYLCHOLINE CHLORIDE 20 MG/ML IJ SOLN
INTRAMUSCULAR | Status: DC | PRN
  Administered 2013-07-31: 100 mg via INTRAVENOUS

## 2013-07-31 MED ORDER — TISSEEL VH 10 ML EX KIT
PACK | CUTANEOUS | Status: AC
  Filled 2013-07-31: qty 2

## 2013-07-31 MED ORDER — MIDAZOLAM HCL 5 MG/5ML IJ SOLN
INTRAMUSCULAR | Status: DC | PRN
  Administered 2013-07-31: 2 mg via INTRAVENOUS

## 2013-07-31 MED ORDER — HYDROMORPHONE HCL PF 1 MG/ML IJ SOLN
0.5000 mg | INTRAMUSCULAR | Status: DC | PRN
  Administered 2013-07-31 – 2013-08-01 (×2): 1 mg via INTRAVENOUS
  Filled 2013-07-31 (×2): qty 1

## 2013-07-31 MED ORDER — METOCLOPRAMIDE HCL 5 MG/ML IJ SOLN: 5.0000 mg | Freq: Four times a day (QID) | INTRAMUSCULAR | Status: DC | PRN

## 2013-07-31 MED ORDER — LACTATED RINGERS IV SOLN
INTRAVENOUS | Status: DC | PRN
  Administered 2013-07-31 (×4): via INTRAVENOUS

## 2013-07-31 MED ORDER — FENTANYL CITRATE 0.05 MG/ML IJ SOLN
INTRAMUSCULAR | Status: DC | PRN
  Administered 2013-07-31: 50 ug via INTRAVENOUS
  Administered 2013-07-31 (×3): 100 ug via INTRAVENOUS
  Administered 2013-07-31: 50 ug via INTRAVENOUS
  Administered 2013-07-31: 100 ug via INTRAVENOUS

## 2013-07-31 MED ORDER — BISACODYL 10 MG RE SUPP: 10.0000 mg | Freq: Two times a day (BID) | RECTAL | Status: DC | PRN

## 2013-07-31 MED ORDER — LACTATED RINGERS IV SOLN: INTRAVENOUS | Status: DC

## 2013-07-31 MED ORDER — DEXAMETHASONE SODIUM PHOSPHATE 4 MG/ML IJ SOLN
4.0000 mg | Freq: Two times a day (BID) | INTRAMUSCULAR | Status: AC
  Administered 2013-07-31 – 2013-08-01 (×3): 4 mg via INTRAVENOUS
  Filled 2013-07-31 (×3): qty 1

## 2013-07-31 MED ORDER — LACTATED RINGERS IV SOLN
INTRAVENOUS | Status: DC | PRN
  Administered 2013-07-31: 2000 mL via INTRAVENOUS

## 2013-07-31 MED ORDER — GLYCOPYRROLATE 0.2 MG/ML IJ SOLN
INTRAMUSCULAR | Status: DC | PRN
  Administered 2013-07-31: 0.6 mg via INTRAVENOUS

## 2013-07-31 MED ORDER — LORAZEPAM 2 MG/ML IJ SOLN: 0.5000 mg | Freq: Three times a day (TID) | INTRAMUSCULAR | Status: DC | PRN

## 2013-07-31 MED ORDER — SODIUM CHLORIDE 0.9 % IV SOLN: 250.0000 mL | INTRAVENOUS | Status: DC | PRN

## 2013-07-31 MED ORDER — SACCHAROMYCES BOULARDII 250 MG PO CAPS
250.0000 mg | ORAL_CAPSULE | Freq: Two times a day (BID) | ORAL | Status: DC
  Administered 2013-07-31 – 2013-08-02 (×4): 250 mg via ORAL
  Filled 2013-07-31 (×5): qty 1

## 2013-07-31 MED ORDER — PROMETHAZINE HCL 25 MG RE SUPP: 25.0000 mg | Freq: Four times a day (QID) | RECTAL | Status: DC | PRN

## 2013-07-31 MED ORDER — FENTANYL CITRATE 0.05 MG/ML IJ SOLN
25.0000 ug | INTRAMUSCULAR | Status: DC | PRN
  Administered 2013-07-31 (×2): 25 ug via INTRAVENOUS
  Administered 2013-07-31: 50 ug via INTRAVENOUS

## 2013-07-31 MED ORDER — LACTATED RINGERS IV BOLUS (SEPSIS): 1000.0000 mL | Freq: Three times a day (TID) | INTRAVENOUS | Status: DC | PRN

## 2013-07-31 MED ORDER — HEPARIN SODIUM (PORCINE) 5000 UNIT/ML IJ SOLN
5000.0000 [IU] | Freq: Three times a day (TID) | INTRAMUSCULAR | Status: DC
  Administered 2013-08-01 – 2013-08-02 (×4): 5000 [IU] via SUBCUTANEOUS
  Filled 2013-07-31 (×7): qty 1

## 2013-07-31 SURGICAL SUPPLY — 72 items
ADH SKN CLS APL DERMABOND .7 (GAUZE/BANDAGES/DRESSINGS) ×1
APL SRG 32X5 SNPLK LF DISP (MISCELLANEOUS)
APPLIER CLIP 5 13 M/L LIGAMAX5 (MISCELLANEOUS) ×2
APPLIER CLIP ROT 10 11.4 M/L (STAPLE)
APR CLP MED LRG 11.4X10 (STAPLE)
APR CLP MED LRG 5 ANG JAW (MISCELLANEOUS) ×1
CABLE HIGH FREQUENCY MONO STRZ (ELECTRODE) ×2 IMPLANT
CANISTER SUCTION 2500CC (MISCELLANEOUS) ×2 IMPLANT
CLIP APPLIE 5 13 M/L LIGAMAX5 (MISCELLANEOUS) IMPLANT
CLIP APPLIE ROT 10 11.4 M/L (STAPLE) IMPLANT
CLOTH BEACON ORANGE TIMEOUT ST (SAFETY) ×2 IMPLANT
DECANTER SPIKE VIAL GLASS SM (MISCELLANEOUS) ×2 IMPLANT
DERMABOND ADVANCED (GAUZE/BANDAGES/DRESSINGS) ×1
DERMABOND ADVANCED .7 DNX12 (GAUZE/BANDAGES/DRESSINGS) IMPLANT
DRAIN CHANNEL RND F F (WOUND CARE) ×1 IMPLANT
DRAIN PENROSE 18X1/2 LTX STRL (DRAIN) IMPLANT
DRAPE LAPAROSCOPIC ABDOMINAL (DRAPES) ×2 IMPLANT
DRAPE WARM FLUID 44X44 (DRAPE) ×2 IMPLANT
DRSG TEGADERM 2-3/8X2-3/4 SM (GAUZE/BANDAGES/DRESSINGS) ×8 IMPLANT
ELECT REM PT RETURN 9FT ADLT (ELECTROSURGICAL) ×2
ELECTRODE REM PT RTRN 9FT ADLT (ELECTROSURGICAL) ×1 IMPLANT
EVACUATOR SILICONE 100CC (DRAIN) ×1 IMPLANT
FELT TEFLON 4 X1 (Mesh General) ×3 IMPLANT
FILTER SMOKE EVAC LAPAROSHD (FILTER) ×2 IMPLANT
GLOVE BIOGEL PI IND STRL 7.0 (GLOVE) ×1 IMPLANT
GLOVE BIOGEL PI INDICATOR 7.0 (GLOVE) ×5
GLOVE ECLIPSE 8.0 STRL XLNG CF (GLOVE) ×2 IMPLANT
GLOVE INDICATOR 8.0 STRL GRN (GLOVE) ×2 IMPLANT
GOWN STRL NON-REIN LRG LVL3 (GOWN DISPOSABLE) ×1 IMPLANT
GOWN STRL REIN XL XLG (GOWN DISPOSABLE) ×6 IMPLANT
KIT BASIN OR (CUSTOM PROCEDURE TRAY) ×2 IMPLANT
LEGGING LITHOTOMY PAIR STRL (DRAPES) ×2 IMPLANT
MARKER SKIN DUAL TIP RULER LAB (MISCELLANEOUS) ×2 IMPLANT
MESH SURGISIS HERNIA ×1 IMPLANT
MESH ULTRAPRO 3X6 7.6X15CM (Mesh General) ×1 IMPLANT
NS IRRIG 1000ML POUR BTL (IV SOLUTION) ×3 IMPLANT
PENCIL BUTTON HOLSTER BLD 10FT (ELECTRODE) IMPLANT
PUNCH BIOPSY 3 (MISCELLANEOUS) IMPLANT
SCALPEL HARMONIC ACE (MISCELLANEOUS) ×2 IMPLANT
SCISSORS LAP 5X35 DISP (ENDOMECHANICALS) ×2 IMPLANT
SEALANT SURGICAL APPL DUAL CAN (MISCELLANEOUS) IMPLANT
SET IRRIG TUBING LAPAROSCOPIC (IRRIGATION / IRRIGATOR) ×2 IMPLANT
SLEEVE ENDOPATH XCEL 5M (ENDOMECHANICALS) ×3 IMPLANT
SLEEVE Z-THREAD 5X100MM (TROCAR) ×3 IMPLANT
SOLUTION ANTI FOG 6CC (MISCELLANEOUS) IMPLANT
SPONGE DRAIN TRACH 4X4 STRL 2S (GAUZE/BANDAGES/DRESSINGS) ×1 IMPLANT
SPONGE GAUZE 4X4 12PLY (GAUZE/BANDAGES/DRESSINGS) ×1 IMPLANT
STAPLER VISISTAT 35W (STAPLE) ×1 IMPLANT
SUT 0 0 DBL MH NDL ETHIBOND (SUTURE) IMPLANT
SUT 0 0 DBL MH NEEDLE ETHIBOND (SUTURE) IMPLANT
SUT ETHIBOND 0 (SUTURE) IMPLANT
SUT ETHIBOND 0 36 GRN (SUTURE) ×6 IMPLANT
SUT ETHIBOND CT1 BRD #0 30IN (SUTURE) IMPLANT
SUT ETHIBOND NAB CT1 #1 30IN (SUTURE) ×6 IMPLANT
SUT ETHILON 2 0 PS N (SUTURE) IMPLANT
SUT MNCRL AB 4-0 PS2 18 (SUTURE) ×2 IMPLANT
SUT PDS AB 3-0 SH 27 (SUTURE) IMPLANT
SUT PDS AB 4-0 SH 27 (SUTURE) IMPLANT
SUT VICRYL 0 TIES 12 18 (SUTURE) IMPLANT
TAPE PAPER 3X10 WHT MICROPORE (GAUZE/BANDAGES/DRESSINGS) ×1 IMPLANT
TIP INNERVISION DETACH 40FR (MISCELLANEOUS) IMPLANT
TIP INNERVISION DETACH 50FR (MISCELLANEOUS) IMPLANT
TIP INNERVISION DETACH 56FR (MISCELLANEOUS) ×1 IMPLANT
TIPS INNERVISION DETACH 40FR (MISCELLANEOUS)
TOWEL OR 17X26 10 PK STRL BLUE (TOWEL DISPOSABLE) ×2 IMPLANT
TRAY FOLEY CATH 14FRSI W/METER (CATHETERS) ×2 IMPLANT
TRAY LAP CHOLE (CUSTOM PROCEDURE TRAY) ×2 IMPLANT
TROCAR BLADELESS OPT 5 100 (ENDOMECHANICALS) ×1 IMPLANT
TROCAR XCEL NON-BLD 11X100MML (ENDOMECHANICALS) ×1 IMPLANT
TROCAR XCEL NON-BLD 5MMX100MML (ENDOMECHANICALS) ×1 IMPLANT
TUBING FILTER THERMOFLATOR (ELECTROSURGICAL) ×2 IMPLANT
WATER STERILE IRR 1500ML POUR (IV SOLUTION) ×1 IMPLANT

## 2013-07-31 NOTE — Progress Notes (Signed)
INITIAL NUTRITION ASSESSMENT  DOCUMENTATION CODES Per approved criteria  -Not Applicable   INTERVENTION: - Diet advancement per MD - Used teach back method to educate pt on post-op diet for nissen fundoplication. Emphasis placed on pt to consume small bite size moist pieces of pureed food diet for the next 2-3 weeks. Encouraged pt to continue to use nutritional supplements if appetite remains poor. Handouts provided. Expect good compliance. - Will continue to monitor   NUTRITION DIAGNOSIS: Unintended weight loss related to hiatal hernia as evidenced by pt report.    Goal: Advance diet as tolerated to pureed diet.   Monitor:  Weights, labs, diet advancement  Reason for Assessment: Consult  67 y.o. female  Admitting Dx: Symptomatic paraesophageal hiatal hernia  ASSESSMENT: Pt with history of paraesophageal hiatal hernia and GERD. S/p nissen fundoplication today. Met with pt who reports drinking 2 Ensures and consuming 1 small meal/day PTA. Pt with 44 pound weight loss since last year, however pt unsure how much has come from eating healthier and following a gluten-free diet versus poor intake from hernia.   Height: Ht Readings from Last 1 Encounters:  07/31/13 5' 1.5" (1.562 m)    Weight: Wt Readings from Last 1 Encounters:  07/31/13 142 lb 9.5 oz (64.68 kg)    Ideal Body Weight: 110 lb   % Ideal Body Weight: 129%  Wt Readings from Last 10 Encounters:  07/31/13 142 lb 9.5 oz (64.68 kg)  07/31/13 142 lb 9.5 oz (64.68 kg)  07/23/13 142 lb 9.6 oz (64.683 kg)  05/25/13 144 lb 6.4 oz (65.499 kg)    Usual Body Weight: 186 lb per pt  % Usual Body Weight: 76%  BMI:  Body mass index is 26.51 kg/(m^2).  Estimated Nutritional Needs: Kcal: 1625-1950 Protein: 80-90g Fluid: 1.6-1.9L/day  Skin: Abdominal incisions   Diet Order: Clear Liquid  EDUCATION NEEDS: -Education needs addressed - discussed diet for nissen fundoplication    Intake/Output Summary (Last 24  hours) at 07/31/13 1520 Last data filed at 07/31/13 1400  Gross per 24 hour  Intake   4850 ml  Output    485 ml  Net   4365 ml    Last BM: PTA   Labs:  No results found for this basename: NA, K, CL, CO2, BUN, CREATININE, CALCIUM, MG, PHOS, GLUCOSE,  in the last 168 hours  CBG (last 3)  No results found for this basename: GLUCAP,  in the last 72 hours  Scheduled Meds: . acetaminophen  1,000 mg Oral TID  . dexamethasone  4 mg Intravenous Q12H  . famotidine  20 mg Oral BID  . fentaNYL      . [START ON 08/01/2013] heparin  5,000 Units Subcutaneous Q8H  . lip balm  1 application Topical BID  . multivitamin with minerals  1 tablet Oral Daily  . [START ON 08/01/2013] pantoprazole  40 mg Oral Daily  . saccharomyces boulardii  250 mg Oral BID  . sodium chloride  3 mL Intravenous Q12H    Continuous Infusions: . lactated ringers      Past Medical History  Diagnosis Date  . GERD (gastroesophageal reflux disease)   . Paraesophageal hiatal hernia     causes vomiting, chest and left shoulder pain, sometimes left jaw pain  . H/O hiatal hernia     paraesophageal hernia  . Headache(784.0)     ocas migraines  . Anemia     "off and on"  . Arthritis     knees, hands, hips -  hx of dislocated hip many yrs ago   . Fibromyalgia     Past Surgical History  Procedure Laterality Date  . Colostomy closure  2010  . Hiatal hernia repair  2005    [place]  . Hiatal hernia repair  2007    Lap with MTF bridge mesh.  Dr Daphine Deutscher,   . Ventral hernia repair  09/06/2010  . Laparoscopic incisional / umbilical / ventral hernia repair  11/2010    lap VWH with mesh  . Left colectomy  2010    with colostomy for perforated diverticulitis  . Esophageal manometry N/A 06/08/2013    Procedure: ESOPHAGEAL MANOMETRY (EM);  Surgeon: Graylin Shiver, MD;  Location: [facility];  Service: Endoscopy;  Laterality: N/A;  Dr. Bosie Clos will read Manometry for Dr. Evette Cristal  . Colon surgery    . 2011 right knee  arthroscopy      Levon Hedger MS, RD, LDN [phone] Pager [phone] After Hours Pager

## 2013-07-31 NOTE — Anesthesia Postprocedure Evaluation (Signed)
  Anesthesia Post-op Note  Patient: Carol Roberts  Procedure(s) Performed: Procedure(s) (LRB): LAPAROSCOPIC LYSIS OF ADHESIONS FOR THREE HOURS, REDO ESOPHAGEAL HIATAL HERNIA REPAIR WITH MESH REENFORCEMENT, EGD (N/A)  Patient Location: PACU  Anesthesia Type: General  Level of Consciousness: awake and alert   Airway and Oxygen Therapy: Patient Spontanous Breathing  Post-op Pain: mild  Post-op Assessment: Post-op Vital signs reviewed, Patient's Cardiovascular Status Stable, Respiratory Function Stable, Patent Airway and No signs of Nausea or vomiting  Last Vitals:  Filed Vitals:   07/31/13 1538  BP: 153/60  Pulse: 81  Temp: 36.3 C  Resp: 18    Post-op Vital Signs: stable   Complications: No apparent anesthesia complications

## 2013-07-31 NOTE — Transfer of Care (Signed)
Immediate Anesthesia Transfer of Care Note  Patient: Carol Roberts  Procedure(s) Performed: Procedure(s): LAPAROSCOPIC LYSIS OF ADHESIONS FOR THREE HOURS, REDO ESOPHAGEAL HIATAL HERNIA REPAIR WITH MESH REENFORCEMENT, EGD (N/A)  Patient Location: PACU  Anesthesia Type:General  Level of Consciousness: awake, oriented and patient cooperative  Airway & Oxygen Therapy: Patient Spontanous Breathing and Patient connected to face mask oxygen  Post-op Assessment: Report given to PACU RN, Post -op Vital signs reviewed and stable and Patient moving all extremities X 4  Post vital signs: Reviewed and stable  Complications: No apparent anesthesia complications

## 2013-07-31 NOTE — Op Note (Signed)
07/31/2013  1:13 PM  PATIENT:  Carol Roberts  67 y.o. female  Patient Care Team: Shelba Flake, MD as PCP - General (Emergency Medicine) Graylin Shiver, MD as Consulting Physician (Gastroenterology)  PRE-OPERATIVE DIAGNOSIS:  Paraesophageal haital hernia refractory to medical management   POST-OPERATIVE DIAGNOSIS:  RECURRENT HIATAL HERNIA  PROCEDURE:   -Laparoscopic lysis of adhesions x3 hours (60% of case) -Type 2 mediastinal dissection. -Laparoscopic reduction and primary repair of paraesophageal hiatal     Hernia over pledgets. -Mesh reinforcement of hiatal hernia repair -Nissen fundoplication  2cm over a 56-French bougie. -Anterior & posterior gastropexy   SURGEON:  Surgeon(s): Ardeth Sportsman, MD Mariella Saa, MD - Assist  ANESTHESIA:   local and general  EBL:  Total I/O In: 4000 [I.V.:4000] Out: 375 [Urine:275; Blood:100]  Delay start of Pharmacological VTE agent (>24hrs) due to surgical blood loss or risk of bleeding:  no  ANESTHESIA: 1. General anesthesia. 2. Local anesthetic in a field block around all port sites.  SPECIMEN:  Mediastinal hernia sac (not sent).  DRAINS:  A 19-French Blake drain goes from the right upper quadrant along the lesser curvature of the stomach into the mediastinum.  COUNTS:  YES  PLAN OF CARE: Admit to inpatient   PATIENT DISPOSITION:  PACU - hemodynamically stable.  INDICATION:   Patient with symptomatic paraesophageal hiatal hernia.  This is her second recurrence despite biologic mesh reinforcement.  She wished to have surgery done in town.  The patient has had extensive work-up & is felt to benefit from repair:  The anatomy & physiology of the foregut and anti-reflux mechanism was discussed.  The pathophysiology of hiatal herniation and GERD was discussed.  Natural history risks without surgery was discussed.   The patient's symptoms are not adequately controlled by medicines and other non-operative treatments.   I feel the risks of no intervention will lead to serious problems that outweigh the operative risks; therefore, I recommended surgery to reduce the hiatal hernia out of the chest and fundoplication to rebuild the anti-reflux valve and control reflux better.  Need for a thorough workup to rule out the differential diagnosis and plan treatment was explained.  I explained laparoscopic techniques with possible need for an open approach.  Risks such as bleeding, infection, abscess, leak, need for further treatment, heart attack, death, and other risks were discussed.   I noted a good likelihood this will help address the problem.  Goals of post-operative recovery were discussed as well.  Possibility that this will not correct all symptoms was explained.  Post-operative dysphagia, need for short-term liquid & pureed diet, inability to vomit, possibility of reherniation, possible need for medicines to help control symptoms in addition to surgery were discussed.  We will work to minimize complications.   Educational handouts further explaining the pathology, treatment options, and dysphagia diet was given as well.  Questions were answered.  The patient expresses understanding & wishes to proceed with surgery.     OR FINDINGS:   Moderate-sized paraesophageal hiatal hernia with  50% of the stomach in the mediastinum.  Interestingly most of it was in the central part of the stomach including all the antrum and a moderate volume of greater omentum.  There was a 15x 10cm hiatal defect.  It is a primary repair over pledgets.  The patient has ultralightweight polypropylene mesh (Ultropro) reinforcement of the posterior crural repair.  This is covered with the BioA biologic Cook C-shaped esophageal patch mesh.  It is flipped  with the short limb going posteriorly, covering up the Ultrapro and the larger seen limb covering up the anterior repair  The patient has a 2 cm Nissen fundoplication that was done over  56-French bougie.  The patient has had anterior and posterior gastropexy.  DESCRIPTION:   Informed consent was confirmed.  The patient received IV antibiotics prior to incision.  The underwent general anesthesia without difficulty.  A Foley catheter sterilely placed.  The patient was positioned in split leg with arms tucked. The abdomen was prepped and draped in the sterile fashion.  Surgical time-out confirmed our plan.  I placed a 5 mm port in the right upper quadrant paramedian region using optical entry technique with the patient in steep reverse Trendelenburg and left side up.  Entry was clean.  We induced carbon dioxide insufflation.  Camera inspection revealed no injury.  The patient had moderate lesions of greater omentum and small bowel to most the anterior abdominal wall.  I freed them down to infraumbilical, leaving a small section of left lower quadrant adhesions alone.  Under Direct visualization, I placed 5 mm port in the right mid abdomen and a 10 mm port on the anterior axillary line on the left subcostal ridge.  I also placed a 5 mm port in the left subxiphoid region under direct visualization.  I removed that and placed an Omega-shaped rigid Nathanson liver retractor to lift the left lateral sector of the liver anteriorly to expose the esophageal hiatus.  This was secured to the bed using the iron man system.  I placed another 5mm port in the subxiphoid region.  We freed off the moderate omental and stomach adhesions to the underside of the left lateral sector of the liver.  Found a replaced right hepatic artery.  An some oozing from a side branch.  Controlled with clips.  We left that alone.  We alternated dissection from left lateral to anterior to right lateral.  Got into the anterior left mediastinum.  Patient had old stratus biologic mesh that made dissecting the planes a challenge, but eventually we are to free it off the stomach and keep it mostly adherent to the crus.   We freed the stomach and omentum off the attachments to the left inner crus down to the base.  We freed off some short gastrics on the mid stomach as well to get in the posterior window.  We came around anteriorly and using mainly cold scissors carefully freed the stomach and esophagus often adhesions to the occur.  The most a dense adhesions were right anterior crus.  Eventually came around that.  Then connected and freed off the remaining attachments off the caudate lobe and right crus, sparing the right replaced hepatic artery.  I then proceeded to free off much of the biologic mesh that was adherent to the fundoplication and the stomach and esophagus.  I used cold scissors scissors.  Is able to move about 80% of it.  We came across all pledgets from the initial fundoplication that had pulled apart.  I agree with Dr. Daphine Deutscher that it was only a partial wrap.  Eventually was able to free off the right side of the wrap and freed off the posterior esophagus and reduce it over to the left side.  Straighten that out.  Remove some mediastinal sac and some large epiphrenic fat pads.  Some more biologic mesh as well. We completed the release of phrenoesophageal attachments to the medial part of the left crus down to  its base.  With this, we had circumferential mobilization.    We placed the stomach and esophagus on axial tension.  I then did a type 2 mediastinal dissection where I freed the esophagus from its attachments to the aorta, pleura, and pericardium using primarily gentle blunt as well as focused ultrasonic dissection.  I do not see any remnant of the anterior vagus nerve on the cardia/distal esophagus but I saw a good candidate for the posterior vagus nerve and worked to try and preserve that.  We ended up dissecting 20 cm proxiammly into the mediastinum, just above the azygous vein.  We freed esophageal attachments to the pericardium and aorta.  Pleura not densely adherent since omentum had filled up a  lot of the mediastinum.  With that I could straighten out the esophagus and get 4 cm of intra-abdominal length of the esophagus at a best estimation.  I brought the fundus of the stomach posterior to the esophagus over to the right side.  The wrap was mobile with the classic shoe shine maneuver.  Wrap became together gently.  We reflected the stomach left laterally and closed the esophageal hiatus using 0 Ethibond stitch using horizontal mattress stitches with pledgets on both sides.  I did that x3 stitches posteriorly.  The hernia defect was especially stretched out in the left anterolateral aspect.  I ended up closing the defect above the esophagus with 2 interrupted pledgeted sutures on the anterior aspect as the esophagus rested better there.  Not my typical technique, but that was the best way to bring things together without tension.   Also, the esophagus rested better there and was not kinked.  I had already used harmonic scalpel and sharp dissection to release the right crus of attachments to the liver and IVC to help get some length there.  The left diaphragm was somewhat strictured and contracted so not as much mobility there. Luckily, the crura had good substance and they came together well without any tension, So I did not have to do a release on the left crura as well.  I decided to reinforce the hiatal closure with mesh.  I used ultra light weight polypropylene mesh.  I laid a 12x6cm sheet transversely over the posterior closure.  I attached that to the closure using #1 Ethibond horizontal mattress stitches to good result.  I decided to protect the stomach and esophagus from that mesh by laying the C-shaped BioA cook esophageal patch.  I preloaded Ethibond stitches at the corners and sides.  I flipped it such that the narrower limb covered up the ultrapro posterior hiatal crural closure and I left the larger limb to cover the anterior repair.  This provided good coverage.  Actually had the  meshes tucked close to the IVC and above the left lateral sector on the right crus.  Also tucked posterior to the spleen on the left crus.   I reset up the and Nissen wrap and did a posterior gastropexy by taking couple of #1 Ethibond stitches to the posterior part of the right side of the wrap and thru the crural closure and tied that down for good posterior gastropexy up against the hiatal closure.  I then did a left anterior gastropexy taking the apex of the left side of the wrap and tacked it to the left anterior crura just posterior to the phrenic vein. I tied that down.  This helped the wrap for completion.  Next, Anesthesia passed a 56-French bougie transorally.  It  was a lighted bougie and we did do this under axial tension.  It passed down easily without resistance.  I then did a classic 2 cm Nissen fundoplication on the true esophagus above the cardia using Ethibond stitch in the left side of the Wrap, then anterior esophagus, and then right side of the wrap and tied that down. Did 3 stitches.  I measured it and it was 2 cm in length.  We removed the bougie.  It was intact.    The wrap was soft and floppy.  I placed a drain as noted above.  I did irrigation and ensured hemostasis.  I saw no evidence of any leak or perforation or other abnormality.  I removed the [facility] liver retractor under direct visualization.  I evacuated carbon dioxide and removed the ports.  The skin was closed with Monocryl and sterile dressings applied.  The patient is being extubated and brought back to the recovery room.  I discussed postop care in detail with the patient and family in in the office.  Discussed again with the patient in the holding area.  I am about to discuss it with her family as well.

## 2013-07-31 NOTE — H&P (Signed)
HPI   Carol Roberts   May 18, 1946 [id number]   Patient Care Team: Graylin Shiver, MD as Consulting Physician (Gastroenterology)   This patient is a 67 y.o.female who presents today for surgical evaluation at the request of Dr. Evette Cristal.    Reason for visit: Recurrent Paraesophageal hiatal hernia with distal stomach incarcerated   Pleasant obese female with numerous abdominal surgeries.  She had initial INR and repair done laparoscopically in 2005.  She developed a recurrence.  This was repaired laparoscopically in 2007 by my senior partner, Dr. Daphine Deutscher.  He had used biologic mesh as a bridge over the defect as the hiatal defect was large.  She had pretty good results with this.  No more heartburn or reflux.     However, last fall she began to have some issues of abdominal discomfort and nausea.  Began worsening with vomiting.  Worsening reflux.  Became more intense.  Discussed with her gastroenterologist.  Upper GI done.  Antrum and pylorus herniated up through what appears to be a separate defect.  Placed on proton pump inhibitors and H2 blockers.  She is feeling better now On maximal medical therapy.  However, she can only really tolerate shakes and a few bites of solid food.  Not her normal baseline.  She has lost a lot of weight intentionally over the past two years.  However now she is losing much more aggressively given her poor oral intake.  No major hematemesis.  Based on concerns, she was sent to [place] for surgical evaluation.  I performed a ventral hernia repair in her as the last operation.  She wished to be seen by me.  Manometry WNL.  EGD no surprises.  No new events    Patient Active Problem List     Diagnosis  Date Noted   .  Paraesophageal hiatal hernia - recurrent  05/25/2013         Past Medical History   Diagnosis  Date   .  Arthritis     .  GERD (gastroesophageal reflux disease)           Past Surgical History   Procedure  Laterality  Date   .  Colostomy  closure    2010   .  Hiatal hernia repair    2005       [place]   .  Hiatal hernia repair    2007       Lap with MTF bridge mesh.  Dr Daphine Deutscher,    .  Ventral hernia repair    09/06/2010   .  Laparoscopic incisional / umbilical / ventral hernia repair    11/2010       lap VWH with mesh   .  Left colectomy    2010       with colostomy for perforated diverticulitis         History       Social History   .  Marital Status:  Widowed       Spouse Name:  N/A       Number of Children:  N/A   .  Years of Education:  N/A       Occupational History   .  Not on file.       Social History Main Topics   .  Smoking status:  Never Smoker    .  Smokeless tobacco:  Never Used   .  Alcohol Use:  No   .  Drug Use:  No   .  Sexually Active:  Not on file       Other Topics  Concern   .  Not on file       Social History Narrative   .  No narrative on file        History reviewed. No pertinent family history.    Current Outpatient Prescriptions   Medication  Sig  Dispense  Refill   .  famotidine (PEPCID) 20 MG tablet  Take 20 mg by mouth 2 (two) times daily.         Marland Kitchen  FIBER-VITAMINS-MINERALS PO  Take by mouth.         .  Multiple Vitamins-Minerals (MULTIVITAMIN WITH MINERALS) tablet  Take 1 tablet by mouth daily.         .  pantoprazole (PROTONIX) 40 MG tablet               No current facility-administered medications for this visit.        No Known Allergies   BP 130/72  Pulse 82  Temp(Src) 97.3 F (36.3 C) (Temporal)  Resp 14  Ht 5' 1.5" (1.562 m)  Wt 144 lb 6.4 oz (65.499 kg)  BMI 26.85 kg/m2   Dg Ugi W/high Density W/kub   05/08/2013   *RADIOLOGY REPORT*  Clinical Data:Epigastric pain  UPPER GI SERIES W/HIGH DENSITY W/KUB  Technique: After obtaining a scout radiograph, upper GI series performed with high density barium and effervescent agent. Thin barium also used.  Fluoroscopy Time: 3 minutes 54 seconds  Comparison: CT abdomen pelvis of 10/12/2010 and upper  GI of 03/01/2006  Findings: A preliminary film of the abdomen shows a nonspecific bowel gas pattern.  Multiple mesh coils are noted in the left abdomen and overlying the pelvis.  There is degenerative change noted in the lower lumbar spine.  A double contrast study was performed.  There is suboptimal coating of the esophageal mucosa, but no mucosal abnormality is seen.  The swallowing mechanism is unremarkable.  There are moderate tertiary contractions within the mid and distal esophagus. However, there has been definite interval change since the prior upper GI and CT of the abdomen pelvis.  The esophagus extends into the fundus of the stomach which lies below the hemidiaphragm. However the mid and distal stomach protrudes into a hernia sac paraesophageal location lying above the diaphragm.  There is considerable delay in passage of barium through the hernia defect into the duodenum. The patient walked around our facility for approximately 1/2 hour, and was then re- imaged.  At that time, this is a small amount of barium passing through the diaphragmatic defect into the duodenum which lies below the hemidiaphragm. Therefore, there is a partial gastric obstruction as a result of this hernia of the distal portion of the stomach above the hemidiaphragm.  IMPRESSION:  1.  There is a new herniation of the distal half of the stomach above the hemidiaphragm, paraesophageal in location.  As a result there is a definite delay in passage of barium into the duodenum which lies below the hemidiaphragm, consistent with a partial gastric outlet obstruction. 2.  Tertiary contractions in the mid and distal esophagus.   Original Report Authenticated By: Dwyane Dee, M.D.         Review of Systems  Constitutional: Negative for fever, chills, diaphoresis, appetite change and fatigue.  HENT: Negative for ear pain, sore throat, trouble swallowing, neck pain and ear discharge.   Eyes: Negative for photophobia, discharge  and  visual disturbance.  Respiratory: Negative for cough, choking, chest tightness and shortness of breath.   Cardiovascular: Negative for chest pain and palpitations.  Gastrointestinal: Positive for nausea, vomiting, abdominal pain and constipation. Negative for diarrhea, anal bleeding and rectal pain.  Endocrine: Negative for cold intolerance and heat intolerance.  Genitourinary: Negative for dysuria, frequency and difficulty urinating.  Musculoskeletal: Negative for myalgias and gait problem.  Skin: Negative for color change, pallor and rash.  Allergic/Immunologic: Negative for environmental allergies, food allergies and immunocompromised state.  Neurological: Negative for dizziness, speech difficulty, weakness and numbness.  Hematological: Negative for adenopathy.  Psychiatric/Behavioral: Negative for confusion and agitation. The patient is not nervous/anxious.           Objective:     Physical Exam  Constitutional: She is oriented to person, place, and time. She appears well-developed and well-nourished. No distress.  HENT:   Head: Normocephalic.   Mouth/Throat: Oropharynx is clear and moist. No oropharyngeal exudate.  Eyes: Conjunctivae and EOM are normal. Pupils are equal, round, and reactive to light. No scleral icterus.  Neck: Normal range of motion. Neck supple. No tracheal deviation present.  Cardiovascular: Normal rate, regular rhythm and intact distal pulses.   Pulmonary/Chest: Effort normal and breath sounds normal. No stridor. No respiratory distress. She exhibits no tenderness.  Abdominal: Soft. She exhibits no distension and no mass. There is tenderness. There is no rigidity, no rebound, no guarding, no CVA tenderness, no tenderness at McBurney's point and negative Murphy's sign. Hernia confirmed negative in the ventral area, confirmed negative in the right inguinal area and confirmed negative in the left inguinal area.    Genitourinary: No vaginal discharge found.   Musculoskeletal: Normal range of motion. She exhibits no tenderness.       Right elbow: She exhibits normal range of motion.       Left elbow: She exhibits normal range of motion.       Right wrist: She exhibits normal range of motion.       Left wrist: She exhibits normal range of motion.       Right hand: Normal strength noted.       Left hand: Normal strength noted.  Lymphadenopathy:       Head (right side): No posterior auricular adenopathy present.       Head (left side): No posterior auricular adenopathy present.    She has no cervical adenopathy.    She has no axillary adenopathy.       Right: No inguinal adenopathy present.       Left: No inguinal adenopathy present.  Neurological: She is alert and oriented to person, place, and time. No cranial nerve deficit. She exhibits normal muscle tone. Coordination normal.  Skin: Skin is warm and dry. No rash noted. She is not diaphoretic. No erythema.  Psychiatric: She has a normal mood and affect. Her behavior is normal. Judgment and thought content normal.          Assessment:       Recurrent hiatal hernia.  Atypical paraesophageal presentation with antrum and duodenal bulb herniating  Resulting pain and dysphasia.  Better controlled with liquid only diet and high dose reflux medicine      Plan:       Plans think says that she will need surgery to correct her anatomy.  However, I would complete the workup.  Upper endoscopy and manometry.  Make sure that she does not have any esophageal pathology as well.  At least  it is sensitive she has dysmotility.  I may have to take down her fundoplication to redo it.  If she has dysmotility, may have to hold off on aggressive wrap.  I may have to do a relaxing incision on the diaphragm to help close the hiatus better.  Biologic mesh around the esophagus and permanent mesh over the relaxing incision.  She understands:   The anatomy & physiology of the foregut and anti-reflux mechanism was  discussed.  The pathophysiology of hiatal herniation and GERD was discussed.  Natural history risks without surgery was discussed.   The patient's symptoms are not adequately controlled by medicines and other non-operative treatments.  I feel the risks of no intervention will lead to serious problems that outweigh the operative risks; therefore, I recommended surgery to reduce the hiatal hernia out of the chest and fundoplication to rebuild the anti-reflux valve and control reflux better.  Need for a thorough workup to rule out the differential diagnosis and plan treatment was explained.  I explained laparoscopic techniques with possible need for an open approach.   Risks such as bleeding, infection, abscess, leak, need for further treatment, heart attack, death, and other risks were discussed.   I noted a good likelihood this will help address the problem.  Goals of post-operative recovery were discussed as well.  Possibility that this will not correct all symptoms was explained.  Post-operative dysphagia, need for short-term liquid & pureed diet, inability to vomit, possibility of reherniation, possible need for medicines to help control symptoms in addition to surgery were discussed.  We will work to minimize complications.   Educational handouts further explaining the pathology, treatment options, and dysphagia diet was given as well.  Questions were answered.  The patient expresses understanding & wishes to proceed with surgery.   I cautioned her that she is at risk for esophageal and ,gastric perforation and other issues.  It took a while with the last surgery.  This may take even longer.  Possible ICU stay.  Chest tubes mediastinal tubes, abdominal drain/injury as well.  The other option is to stick on the liquid only diet gradually lanced.  Continue the high-dose proton pump inhibitor and H2 blocker.  She does not feel this is a good permanent situation.  The episodes of nausea vomiting and pain have  been intense and concerned her.  She wishes to proceed with surgery.  She wishes surgery I discussed at length.  I discussed with my partner, Dr. Derrell Lolling, whom has also had extensive laparoscopic training.  We have discussed with Dr. Daphine Deutscher as well.    Ardeth Sportsman, M.D., F.A.C.S. Gastrointestinal and Minimally Invasive Surgery [facility] [address], [address] [place], [place] [address] [phone] Main / Paging

## 2013-07-31 NOTE — Anesthesia Preprocedure Evaluation (Addendum)
Anesthesia Evaluation  Patient identified by MRN, date of birth, ID band Patient awake    Reviewed: Allergy & Precautions, H&P , NPO status , Patient's Chart, lab work & pertinent test results  Airway Mallampati: II TM Distance: >3 FB Neck ROM: Full    Dental no notable dental hx. (+) Partial Upper   Pulmonary neg pulmonary ROS,  breath sounds clear to auscultation  Pulmonary exam normal       Cardiovascular negative cardio ROS  Rhythm:Regular Rate:Normal     Neuro/Psych negative neurological ROS  negative psych ROS   GI/Hepatic Neg liver ROS, hiatal hernia, GERD-  Poorly Controlled,  Endo/Other  negative endocrine ROS  Renal/GU negative Renal ROS  negative genitourinary   Musculoskeletal negative musculoskeletal ROS (+) Fibromyalgia -  Abdominal   Peds negative pediatric ROS (+)  Hematology negative hematology ROS (+)   Anesthesia Other Findings   Reproductive/Obstetrics negative OB ROS                          Anesthesia Physical Anesthesia Plan  ASA: II  Anesthesia Plan: General   Post-op Pain Management:    Induction: Cricoid pressure planned, Rapid sequence and Intravenous  Airway Management Planned: Oral ETT  Additional Equipment:   Intra-op Plan:   Post-operative Plan: Extubation in OR  Informed Consent: I have reviewed the patients History and Physical, chart, labs and discussed the procedure including the risks, benefits and alternatives for the proposed anesthesia with the patient or authorized representative who has indicated his/her understanding and acceptance.   Dental advisory given  Plan Discussed with: CRNA  Anesthesia Plan Comments:         Anesthesia Quick Evaluation

## 2013-07-31 NOTE — Anesthesia Procedure Notes (Addendum)
Procedures

## 2013-07-31 NOTE — Progress Notes (Signed)
Went to walk in clinic with nausea/vomit chest pain treated with Phenergan and some type of slurry. Little relief 07/25/2013

## 2013-08-01 ENCOUNTER — Inpatient Hospital Stay (HOSPITAL_COMMUNITY): Payer: Medicare Other

## 2013-08-01 DIAGNOSIS — K449 Diaphragmatic hernia without obstruction or gangrene: Secondary | ICD-10-CM | POA: Diagnosis not present

## 2013-08-01 LAB — BASIC METABOLIC PANEL
CO2: 29 mEq/L (ref 19–32)
Glucose, Bld: 97 mg/dL (ref 70–99)
Potassium: 3.8 mEq/L (ref 3.5–5.1)
Sodium: 134 mEq/L — ABNORMAL LOW (ref 135–145)

## 2013-08-01 LAB — CBC
Hemoglobin: 11.6 g/dL — ABNORMAL LOW (ref 12.0–15.0)
RBC: 3.81 MIL/uL — ABNORMAL LOW (ref 3.87–5.11)

## 2013-08-01 MED ORDER — IOHEXOL 300 MG/ML  SOLN
50.0000 mL | Freq: Once | INTRAMUSCULAR | Status: AC | PRN
  Administered 2013-08-01: 50 mL via ORAL

## 2013-08-01 NOTE — Progress Notes (Signed)
1 Day Post-Op  Subjective: DOING WELL MIN PAIN.  SWALLOW SHOWS NO LEAK.  Objective: Vital signs in last 24 hours: Temp:  [97.4 F (36.3 C)-98.5 F (36.9 C)] 98.5 F (36.9 C) (08/23 0615) Pulse Rate:  [72-89] 83 (08/23 0615) Resp:  [6-18] 16 (08/23 0615) BP: (102-159)/(51-82) 122/54 mmHg (08/23 0615) SpO2:  [93 %-100 %] 95 % (08/23 0615) Weight:  [142 lb 9.5 oz (64.68 kg)] 142 lb 9.5 oz (64.68 kg) (08/22 1438) Last BM Date: 07/30/13  Intake/Output from previous day: 08/22 0701 - 08/23 0700 In: 5508 [P.O.:240; I.V.:5218; IV Piggyback:50] Out: 1920 [Urine:1730; Drains:90; Blood:100] Intake/Output this shift:    Incision/Wound:SOFT JP  SEROUS INCISIONS OK  Lab Results:   Recent Labs  08/01/13 0418  WBC 8.1  HGB 11.6*  HCT 34.5*  PLT 221   BMET  Recent Labs  08/01/13 0418  NA 134*  K 3.8  CL 99  CO2 29  GLUCOSE 97  BUN 8  CREATININE 0.60  CALCIUM 8.4   PT/INR No results found for this basename: LABPROT, INR,  in the last 72 hours ABG No results found for this basename: PHART, PCO2, PO2, HCO3,  in the last 72 hours  Studies/Results: No results found.  Anti-infectives: Anti-infectives   Start     Dose/Rate Route Frequency Ordered Stop   07/31/13 1130  cefOXitin (MEFOXIN) 2 g in dextrose 5 % 50 mL IVPB     2 g 100 mL/hr over 30 Minutes Intravenous  Once 07/31/13 1120 07/31/13 1144   07/31/13 0520  cefOXitin (MEFOXIN) 2 g in dextrose 5 % 50 mL IVPB     2 g 100 mL/hr over 30 Minutes Intravenous On call to O.R. 07/31/13 0520 07/31/13 0740      Assessment/Plan: s/p Procedure(s): LAPAROSCOPIC LYSIS OF ADHESIONS FOR THREE HOURS, REDO ESOPHAGEAL HIATAL HERNIA REPAIR WITH MESH REENFORCEMENT, EGD (N/A) ADV TO  PUREED DIET.   HOME Sunday   LOS: 1 day    Carol Roberts A. 08/01/2013

## 2013-08-02 LAB — CBC
HCT: 32.8 % — ABNORMAL LOW (ref 36.0–46.0)
Hemoglobin: 11 g/dL — ABNORMAL LOW (ref 12.0–15.0)
MCH: 30.7 pg (ref 26.0–34.0)
MCV: 91.6 fL (ref 78.0–100.0)
RBC: 3.58 MIL/uL — ABNORMAL LOW (ref 3.87–5.11)

## 2013-08-02 LAB — BASIC METABOLIC PANEL
CO2: 27 mEq/L (ref 19–32)
Calcium: 8.7 mg/dL (ref 8.4–10.5)
Creatinine, Ser: 0.53 mg/dL (ref 0.50–1.10)
Glucose, Bld: 131 mg/dL — ABNORMAL HIGH (ref 70–99)

## 2013-08-02 MED ORDER — PNEUMOCOCCAL VAC POLYVALENT 25 MCG/0.5ML IJ INJ
0.5000 mL | INJECTION | Freq: Once | INTRAMUSCULAR | Status: AC
  Administered 2013-08-02: 0.5 mL via INTRAMUSCULAR
  Filled 2013-08-02: qty 0.5

## 2013-08-02 MED ORDER — PNEUMOCOCCAL VAC POLYVALENT 25 MCG/0.5ML IJ INJ
0.5000 mL | INJECTION | Freq: Once | INTRAMUSCULAR | Status: DC
  Filled 2013-08-02: qty 0.5

## 2013-08-02 MED ORDER — PNEUMOCOCCAL VAC POLYVALENT 25 MCG/0.5ML IJ INJ: 0.5000 mL | INJECTION | INTRAMUSCULAR | Status: DC

## 2013-08-02 NOTE — Progress Notes (Signed)
Patient ID: Carol Roberts, female   DOB: January 28, 1946, 67 y.o.   MRN: [id number] 2 Days Post-Op  Subjective: No complaints today. Tolerating pured diet without difficulty. No nausea. No pain, just sort of  Objective: Vital signs in last 24 hours: Temp:  [98.2 F (36.8 C)-99 F (37.2 C)] 99 F (37.2 C) (08/24 0600) Pulse Rate:  [74-85] 85 (08/24 0600) Resp:  [16-18] 18 (08/24 0600) BP: (105-126)/(43-55) 126/55 mmHg (08/24 0600) SpO2:  [94 %-96 %] 94 % (08/24 0600) Last BM Date: 07/30/13  Intake/Output from previous day: 08/23 0701 - 08/24 0700 In: 2517.5 [P.O.:1720; I.V.:757.5] Out: 2510 [Urine:2425; Drains:85] Intake/Output this shift:    General appearance: alert, cooperative and no distress GI: normal findings: soft, non-tender Incision/Wound: clean and dry without infection. JP drainage serosanguineous.  Lab Results:   Recent Labs  08/01/13 0418 08/02/13 0411  WBC 8.1 7.9  HGB 11.6* 11.0*  HCT 34.5* 32.8*  PLT 221 216   BMET  Recent Labs  08/01/13 0418 08/02/13 0411  NA 134* 138  K 3.8 3.9  CL 99 105  CO2 29 27  GLUCOSE 97 131*  BUN 8 10  CREATININE 0.60 0.53  CALCIUM 8.4 8.7     Studies/Results: Dg Esophagus W/water Sol Cm  08/01/2013   *RADIOLOGY REPORT*  Clinical Data:Paraesophageal hernia repair, Nissen fundoplication.  ESOPHAGUS/BARIUM SWALLOW/TABLET STUDY  Fluoroscopy Time: 10 seconds  Comparison: 05/08/2013  Findings: The scout image demonstrates a regional surgical drain. After the patient ingested water-soluble contrast, there is normal passage across the GE junction without significant obstruction into the decompressed stomach.  Post images show no evidence of leak.  IMPRESSION:  1.  Patent Nissen fundoplication without evidence of leak.   Original Report Authenticated By: D. Andria Rhein, MD    Anti-infectives: Anti-infectives   Start     Dose/Rate Route Frequency Ordered Stop   07/31/13 1130  cefOXitin (MEFOXIN) 2 g in dextrose 5 % 50 mL  IVPB     2 g 100 mL/hr over 30 Minutes Intravenous  Once 07/31/13 1120 07/31/13 1144   07/31/13 0520  cefOXitin (MEFOXIN) 2 g in dextrose 5 % 50 mL IVPB     2 g 100 mL/hr over 30 Minutes Intravenous On call to O.R. 07/31/13 0520 07/31/13 0740      Assessment/Plan: s/p Procedure(s): LAPAROSCOPIC LYSIS OF ADHESIONS FOR THREE HOURS, REDO ESOPHAGEAL HIATAL HERNIA REPAIR WITH MESH REENFORCEMENT, EGD Doing very well. Wants to go home. Okay for discharge. Followup Dr. Michaell Cowing in the office in one week.   LOS: 2 days    Carol Roberts 08/02/2013

## 2013-08-02 NOTE — Progress Notes (Addendum)
Assessment unchanged. Pt verbalized understanding of dc instructions through teach back. JP drain teaching done through teach back. Demonstrated understanding of emptying & charging bulb. Dressing at drain site changed. Supplies given to pt to change dressing at home. Pt understands to call MD office for drain removal and follow up care. Scripts x 1 given as provided by MD. Pt already has My Chart account. Discharged via wc to front entrance to meet awaiting vehicle to carry home. Accompanied by NT and daughter.

## 2013-08-05 NOTE — Discharge Summary (Signed)
Physician Discharge Summary  Patient ID: Carol Roberts MRN: [id number] DOB/AGE: May 02, 1946 67 y.o.  Admit date: 07/31/2013 Discharge date: 08/02/2013  Admission Diagnoses: Principal Problem:   Paraesophageal hiatal hernia - recurrent  Discharge Diagnoses:  Principal Problem:   Paraesophageal hiatal hernia - recurrent   Discharged Condition: good  Hospital Course: Patient with recurrent paraesophageal hiatal hernia.  Underwent laparoscopic lysis lesions reduction and repair of hiatal hernia with fundoplication.  Hemodynamically well.  Had swallow valuation on postoperative day one.  No leak nor obstruction.  Advanced on diet.  Postoperatively, the patient mobilized and advanced to a pureed diet gradually.  Pain was well-controlled and transitioned off IV medications.    By the time of discharge, the patient was walking well the hallways, eating pureed food well, having flatus.  Pain was-controlled on an oral regimen.  Based on meeting DC criteria and recovering well, I felt it was safe for the patient to be discharged home with close followup.  Instructions were discussed in detail.  They are written as well.     Consults: None  Significant Diagnostic Studies:   Clinical Data:Paraesophageal hernia repair, Nissen fundoplication.  ESOPHAGUS/BARIUM SWALLOW/TABLET STUDY  Fluoroscopy Time: 10 seconds  Comparison: 05/08/2013  Findings: The scout image demonstrates a regional surgical drain.  After the patient ingested water-soluble contrast, there is normal  passage across the GE junction without significant obstruction into  the decompressed stomach. Post images show no evidence of leak.  IMPRESSION:  1. Patent Nissen fundoplication without evidence of leak.  Original Report Authenticated By: D. Andria Rhein, MD   Treatments:   POST-OPERATIVE DIAGNOSIS: RECURRENT HIATAL HERNIA  PROCEDURE:  -Laparoscopic lysis of adhesions x3 hours (60% of case)  -Type 2 mediastinal  dissection.  -Laparoscopic reduction and primary repair of paraesophageal hiatal  Hernia over pledgets.  -Mesh reinforcement of hiatal hernia repair  -Nissen fundoplication 2cm over a 56-French bougie.  -Anterior & posterior gastropexy  SURGEON: Surgeon(s):  Ardeth Sportsman, MD  Mariella Saa, MD - Assist   Discharge Exam: Blood pressure 126/55, pulse 85, temperature 99 F (37.2 C), temperature source Oral, resp. rate 18, height 5' 1.5" (1.562 m), weight 142 lb 9.5 oz (64.68 kg), SpO2 94.00%.  General: Pt awake/alert/oriented x4 in no major acute distress Eyes: PERRL, normal EOM. Sclera nonicteric Neuro: CN II-XII intact w/o focal sensory/motor deficits. Lymph: No head/neck/groin lymphadenopathy Psych:  No delerium/psychosis/paranoia HENT: Normocephalic, Mucus membranes moist.  No thrush Neck: Supple, No tracheal deviation Chest: No pain.  Good respiratory excursion. CV:  Pulses intact.  Regular rhythm MS: Normal AROM mjr joints.  No obvious deformity Abdomen: Soft, Nondistended.  MIn tender at incisions.  No incarcerated hernias. Ext:  SCDs BLE.  No significant edema.  No cyanosis Skin: No petechiae / purpura   Disposition: 01-Home or Self Care  Discharge Orders   Future Appointments Provider Department Dept Phone   08/06/2013 3:00 PM [facility] Nurse [facility] Surgery, [place] [phone]   08/17/2013 1:30 PM Ardeth Sportsman, MD [facility] Surgery, [place] [phone]   Future Orders Complete By Expires   Call MD for:  extreme fatigue  As directed    Call MD for:  hives  As directed    Call MD for:  persistant nausea and vomiting  As directed    Call MD for:  redness, tenderness, or signs of infection (pain, swelling, redness, odor or green/yellow discharge around incision site)  As directed    Call MD for:  severe uncontrolled  pain  As directed    Call MD for:  As directed    Comments:     Temperature > 101.96F   Diet - low sodium heart healthy  As directed     Discharge instructions  As directed    Comments:     Please see discharge instruction sheets.  Also refer to handout given an office.  Please call our office if you have any questions or concerns [phone]   Discharge patient  As directed    Discharge wound care:  As directed    Comments:     If you have closed incisions, shower and bathe over these incisions with soap and water every day.  Remove all surgical dressings on postoperative day #3.  You do not need to replace dressings over the closed incisions unless you feel more comfortable with a Band-Aid covering it.   If you have an open wound that requires packing, please see wound care instructions.  In general, remove all dressings, wash wound with soap and water and then replace with saline moistened gauze.  Do the dressing change at least every day.  Please call our office [phone] if you have further questions.   Driving Restrictions  As directed    Comments:     No driving until off narcotics and can safely swerve away without pain during an emergency   Increase activity slowly  As directed    Comments:     Walk an hour a day.  Use 20-30 minute walks.  When you can walk 30 minutes without difficulty, increase to low impact/moderate activities such as biking, jogging, swimming, sexual activity..  Eventually can increase to unrestricted activity when not feeling pain.  If you feel pain: STOP!Marland Kitchen   Let pain protect you from overdoing it.  Use ice/heat/over-the-counter pain medications to help minimize his soreness.  Use pain prescriptions as needed to remain active.  It is better to take extra pain medications and be more active than to stay bedridden to avoid all pain medications.   Lifting restrictions  As directed    Comments:     Avoid heavy lifting initially.  Do not push through pain.  You have no specific weight limit.  Coughing and sneezing or four more stressful to your incision than any lifting you will do. Pain will  protect you from injury.  Therefore, avoid intense activity until off all narcotic pain medications.  Coughing and sneezing or four more stressful to your incision than any lifting he will do.   May shower / Bathe  As directed    May walk up steps  As directed    Sexual Activity Restrictions  As directed    Comments:     Sexual activity as tolerated.  Do not push through pain.  Pain will protect you from injury.   Walk with assistance  As directed    Comments:     Walk over an hour a day.  May use a walker/cane/companion to help with balance and stamina.       Medication List    STOP taking these medications       multivitamin tablet      TAKE these medications       acetaminophen 500 MG tablet  Commonly known as:  TYLENOL  Take 1,000 mg by mouth every 6 (six) hours as needed for pain.     alum & mag hydroxide-simeth 200-200-20 MG/5ML suspension  Commonly known as:  MAALOX/MYLANTA  Take  15 mLs by mouth every 6 (six) hours as needed for indigestion.     famotidine 20 MG tablet  Commonly known as:  PEPCID  Take 20 mg by mouth 2 (two) times daily.     multivitamin with minerals tablet  Take 1 tablet by mouth daily.     ondansetron 4 MG tablet  Commonly known as:  ZOFRAN  Take 1 tablet (4 mg total) by mouth every 8 (eight) hours as needed for nausea.     oxyCODONE 5 MG immediate release tablet  Commonly known as:  Oxy IR/ROXICODONE  Take 1-2 tablets (5-10 mg total) by mouth every 4 (four) hours as needed for pain.     pantoprazole 40 MG tablet  Commonly known as:  PROTONIX  Take 40 mg by mouth daily.     promethazine 25 MG suppository  Commonly known as:  PHENERGAN  Place 1 suppository (25 mg total) rectally every 6 (six) hours as needed for nausea.           Follow-up Information   Follow up with Josiane Labine C., MD. Schedule an appointment as soon as possible for a visit in 3 weeks.   Specialty:  General Surgery   Contact information:   [address] [place] [address] [phone]       Follow up with [facility],MD, MD. Schedule an appointment as soon as possible for a visit in 10 days. ([facility] office to have Nurse/Medical Assistant removed your bulb drain around Labor Day)    Specialty:  General Surgery   Contact information:   [address] [place] [address] [phone]       Signed: Ardeth Sportsman. 08/05/2013, 12:32 PM

## 2013-08-06 ENCOUNTER — Ambulatory Visit (INDEPENDENT_AMBULATORY_CARE_PROVIDER_SITE_OTHER): Payer: Medicare Other

## 2013-08-06 DIAGNOSIS — Z4803 Encounter for change or removal of drains: Secondary | ICD-10-CM

## 2013-08-06 DIAGNOSIS — Z4889 Encounter for other specified surgical aftercare: Secondary | ICD-10-CM

## 2013-08-06 NOTE — Progress Notes (Signed)
Patient came in for drain check and removal.  She has had 60 cc out on Tuesday and 45 cc out Wednesday.   She is doing well.  She took some M.O.M for her bowels to move.  I paged Dr Michaell Cowing to make sure he was ok with removal at those numbers.  He advised I can remove the drain.  I removed her drain without difficulty.  I applied a dry gauze dressing.  Her other incisions are healing and have dermabond over them.  I advised that stays on and will start to flake off in 2 to 3 weeks.  I advised she can leave it alone at this time.  I asked her to leave the gauze dressing on for a day and see if the drain site would heal.  I told her it may take 2 days.  She is showering.  Keep drain site covered until healed.  Monitor for signs of infection such as redness, drainage, or fever.  She is tolerating her oral intake at this time.

## 2013-08-17 ENCOUNTER — Ambulatory Visit (INDEPENDENT_AMBULATORY_CARE_PROVIDER_SITE_OTHER): Payer: Medicare Other | Admitting: Surgery

## 2013-08-17 ENCOUNTER — Encounter (INDEPENDENT_AMBULATORY_CARE_PROVIDER_SITE_OTHER): Payer: Self-pay | Admitting: Surgery

## 2013-08-17 ENCOUNTER — Encounter (INDEPENDENT_AMBULATORY_CARE_PROVIDER_SITE_OTHER): Payer: Self-pay

## 2013-08-17 VITALS — BP 130/78 | HR 72 | Resp 16 | Ht 61.0 in | Wt 134.6 lb

## 2013-08-17 DIAGNOSIS — K449 Diaphragmatic hernia without obstruction or gangrene: Secondary | ICD-10-CM

## 2013-08-17 DIAGNOSIS — M546 Pain in thoracic spine: Secondary | ICD-10-CM

## 2013-08-17 NOTE — Progress Notes (Signed)
Subjective:     Patient ID: Carol Roberts, female   DOB: 02-14-1946, 67 y.o.   MRN: [id number]  HPI  RORI GOAR  12/31/1945 [id number]  Patient Care Team: Shelba Flake, MD as PCP - General (Emergency Medicine) Graylin Shiver, MD as Consulting Physician (Gastroenterology)  Procedure (Date: 07/31/2013):  POST-OPERATIVE DIAGNOSIS: RECURRENT Paraesophageal haital hernia refractory to medical management  PROCEDURE:  -Laparoscopic lysis of adhesions x3 hours (60% of case)  -Type 2 mediastinal dissection.  -Laparoscopic reduction and primary repair of paraesophageal hiatal  Hernia over pledgets.  -Mesh reinforcement of hiatal hernia repair  -Nissen fundoplication 2cm over a 56-French bougie.  -Anterior & posterior gastropexy   SURGEON: Surgeon(s):  Ardeth Sportsman, MD  Mariella Saa, MD - Assist   This patient returns for surgical re-evaluation.  She is doing well.  Drain has been removed.  Struggling with constipation.  Oral laxative helps.  Not on a fiber regimen.  Tolerating some soft foods.  Did spit up once yesterday.  No severe nausea vomiting or retching.  No fevers or chills.  Breathing well.  Some soreness between the shoulder blades but not severe.  Taking Tylenol 4 times a day.  That seems to help.  Not really nor needing narcotics.  Appetite improving.  Energy level improving.  She does hope to get back to work later this week.  Patient Active Problem List   Diagnosis Date Noted  . Paraesophageal hiatal hernia - recurrent 05/25/2013    Past Medical History  Diagnosis Date  . GERD (gastroesophageal reflux disease)   . Paraesophageal hiatal hernia     causes vomiting, chest and left shoulder pain, sometimes left jaw pain  . H/O hiatal hernia     paraesophageal hernia  . Headache(784.0)     ocas migraines  . Anemia     "off and on"  . Arthritis     knees, hands, hips - hx of dislocated hip many yrs ago   . Fibromyalgia     Past Surgical History   Procedure Laterality Date  . Colostomy closure  2010  . Hiatal hernia repair  2005    [place]  . Hiatal hernia repair  2007    Lap with MTF bridge mesh.  Dr Daphine Deutscher,   . Ventral hernia repair  09/06/2010  . Laparoscopic incisional / umbilical / ventral hernia repair  11/2010    lap VWH with mesh  . Left colectomy  2010    with colostomy for perforated diverticulitis  . Esophageal manometry N/A 06/08/2013    Procedure: ESOPHAGEAL MANOMETRY (EM);  Surgeon: Graylin Shiver, MD;  Location: [facility];  Service: Endoscopy;  Laterality: N/A;  Dr. Bosie Clos will read Manometry for Dr. Evette Cristal  . Colon surgery    . 2011 right knee arthroscopy    . Hernia repair      History   Social History  . Marital Status: Widowed    Spouse Name: N/A    Number of Children: N/A  . Years of Education: N/A   Occupational History  . Not on file.   Social History Main Topics  . Smoking status: Never Smoker   . Smokeless tobacco: Never Used  . Alcohol Use: No  . Drug Use: No  . Sexual Activity: Not on file   Other Topics Concern  . Not on file   Social History Narrative  . No narrative on file    History reviewed. No pertinent family history.  Current  Outpatient Prescriptions  Medication Sig Dispense Refill  . acetaminophen (TYLENOL) 500 MG tablet Take 1,000 mg by mouth every 6 (six) hours as needed for pain.      Marland Kitchen alum & mag hydroxide-simeth (MAALOX/MYLANTA) 200-200-20 MG/5ML suspension Take 15 mLs by mouth every 6 (six) hours as needed for indigestion.      . famotidine (PEPCID) 20 MG tablet Take 20 mg by mouth 2 (two) times daily.      . Multiple Vitamins-Minerals (MULTIVITAMIN WITH MINERALS) tablet Take 1 tablet by mouth daily.      . ondansetron (ZOFRAN) 4 MG tablet Take 1 tablet (4 mg total) by mouth every 8 (eight) hours as needed for nausea.  15 tablet  2  . pantoprazole (PROTONIX) 40 MG tablet Take 40 mg by mouth daily.       Marland Kitchen oxyCODONE (OXY IR/ROXICODONE) 5 MG immediate release  tablet Take 1-2 tablets (5-10 mg total) by mouth every 4 (four) hours as needed for pain.  40 tablet  0  . promethazine (PHENERGAN) 25 MG suppository Place 1 suppository (25 mg total) rectally every 6 (six) hours as needed for nausea.  5 suppository  3   No current facility-administered medications for this visit.     Allergies  Allergen Reactions  . Adhesive [Tape] Rash    BP 130/78  Pulse 72  Resp 16  Ht 5\' 1"  (1.549 m)  Wt 134 lb 9.6 oz (61.054 kg)  BMI 25.45 kg/m2  Dg Chest 2 View  07/23/2013   *RADIOLOGY REPORT*  Clinical Data: Paraesophageal hernia  CHEST - 2 VIEW  Comparison: September 22, 2005  Findings:  There is a large paraesophageal type hernia present. The lungs are clear.  The heart size and pulmonary vascularity are normal.  No adenopathy.  There is calcification in the aortic arch region.  There is degenerative change in the thoracic spine.  IMPRESSION: Paraesophageal hernia. Lungs clear.  Heart size within normal limits.   Original Report Authenticated By: Bretta Bang, M.D.   Dg Esophagus W/water Sol Cm  08/01/2013   *RADIOLOGY REPORT*  Clinical Data:Paraesophageal hernia repair, Nissen fundoplication.  ESOPHAGUS/BARIUM SWALLOW/TABLET STUDY  Fluoroscopy Time: 10 seconds  Comparison: 05/08/2013  Findings: The scout image demonstrates a regional surgical drain. After the patient ingested water-soluble contrast, there is normal passage across the GE junction without significant obstruction into the decompressed stomach.  Post images show no evidence of leak.  IMPRESSION:  1.  Patent Nissen fundoplication without evidence of leak.   Original Report Authenticated By: D. Andria Rhein, MD     Review of Systems  Constitutional: Negative for fever, chills and diaphoresis.  HENT: Negative for ear pain, sore throat and trouble swallowing.   Eyes: Negative for photophobia and visual disturbance.  Respiratory: Negative for cough and choking.   Cardiovascular: Negative for chest  pain and palpitations.  Gastrointestinal: Negative for nausea, vomiting, abdominal pain, diarrhea, constipation, anal bleeding and rectal pain.  Genitourinary: Negative for dysuria, frequency and difficulty urinating.  Musculoskeletal: Negative for myalgias and gait problem.  Skin: Negative for color change, pallor and rash.  Neurological: Negative for dizziness, speech difficulty, weakness and numbness.  Hematological: Negative for adenopathy.  Psychiatric/Behavioral: Negative for confusion and agitation. The patient is not nervous/anxious.        Objective:   Physical Exam  Constitutional: She is oriented to person, place, and time. She appears well-developed and well-nourished. No distress.  HENT:  Head: Normocephalic.  Mouth/Throat: Oropharynx is clear and moist. No oropharyngeal  exudate.  Eyes: Conjunctivae and EOM are normal. Pupils are equal, round, and reactive to light. No scleral icterus.  Neck: Normal range of motion. No tracheal deviation present.  Cardiovascular: Normal rate and intact distal pulses.   Pulmonary/Chest: Effort normal. No respiratory distress. She exhibits no tenderness.  Abdominal: Soft. She exhibits no distension. There is no tenderness. Hernia confirmed negative in the right inguinal area and confirmed negative in the left inguinal area.  Incisions clean with normal healing ridges.  No hernias  Genitourinary: No vaginal discharge found.  Musculoskeletal: Normal range of motion. She exhibits no tenderness.  Lymphadenopathy:       Right: No inguinal adenopathy present.       Left: No inguinal adenopathy present.  Neurological: She is alert and oriented to person, place, and time. No cranial nerve deficit. She exhibits normal muscle tone. Coordination normal.  Skin: Skin is warm and dry. No rash noted. She is not diaphoretic.  Psychiatric: She has a normal mood and affect. Her behavior is normal.       Assessment:     Recovering relatively well only two  and half weeks out from second redo paraesophageal hiatal hernia repair     Plan:     I think she that she is doing remarkably well the cement out.  I noted episodes of dysphagia is common.  Work to keep food slowly and keep it well chewed.  Continue to minimize carbonation as possible.  Use nausea medications when necessary.  Best way to avoid recurrences to avoid retching.  Continue to keep weight down.  Okay to return to work soon.  She is planning on later this month.  Increase activity as tolerated to regular activity.  Low impact exercise such as walking an hour a day at least ideal.  Do not push through pain.  Diet as tolerated.  Low fat high fiber diet ideal.  Bowel regimen with 30 g fiber a day and fiber supplement as needed to avoid problems.  She will try the MiraLAX since that seemed to be better tolerated than most other fiber supplements  Return to clinic in 3 weeks to make sure that she is improving, otherwise as needed.   Instructions discussed.  Followup with primary care physician for other health issues as would normally be done.  Questions answered.  The patient expressed understanding and appreciation

## 2013-08-17 NOTE — Patient Instructions (Addendum)
EATING AFTER YOUR ESOPHAGEAL SURGERY (Stomach Fundoplication, Hiatal Hernia repair, Achalasia surgery, etc)  After your esophageal surgery, expect some sticking with swallowing over the next 1-2 months.    If food sticks when you eat, it is called "dysphagia".  This is due to swelling around your esophagus at the wrap & hiatal diaphragm repair.  It will gradually ease off over the next few months.  To help you through this temporary phase, we start you out on a pureed (blenderized) diet.  Your first meal in the hospital was thin liquids.  You should have been given a pureed diet by the time you left the hospital.  We ask patients to stay on a pureed diet for the first 2-3 weeks to avoid anything getting "stuck" near your recent surgery.  Don't be alarmed if your ability to swallow doesn't progress according to this plan.  Everyone is different and some diets can advance more or less quickly.     Some BASIC RULES to follow are:  Maintain an upright position whenever eating or drinking.  Take small bites - just a teaspoon size bite at a time.  Eat slowly.  It may also help to eat only one food at a time.  Consider nibbling through smaller, more frequent meals & avoid the urge to eat BIG meals  Do not push through feelings of fullness, nausea, or bloatedness  Do not mix solid foods and liquids in the same mouthful  Try not to "wash foods down" with large gulps of liquids. Avoid carbonated (bubbly/fizzy) drinks.  Understand that it will be hard to burp and belch at first.  This gradually improves with time.  Expect to be more gassy/flatulent/bloated initially.  Walking will help you work through that.  Maalox/Gas-X can help as well.  Eat in a relaxed atmosphere & minimize distractions.  Avoid talking while eating.    Do not use straws.  Following each meal, sit in an upright position (90 degree angle) for 60 to 90 minutes.  Going for a short walk can help as well  If food does stick,  don't panic.  Try to relax and let the food pass on its own.  Sipping WARM LIQUID such as strong hot black tea can also help slide it down.   Be gradual in changes & use common sense:  -If you easily tolerating a certain "level" of foods, advance to the next level gradually -If you are having trouble swallowing a particular food, then avoid it.   -If food is sticking when you advance your diet, go back to thinner previous diet (the lower LEVEL) for 1-2 days.  LEVEL 1 = PUREED DIET  Do for the first 2 WEEKS AFTER SURGERY  -Foods in this group are pureed or blenderized to a smooth, mashed potato-like consistency.  -If necessary, the pureed foods can keep their shape with the addition of a thickening agent.   -Meat should be pureed to a smooth, pasty consistency.  Hot broth or gravy may be added to the pureed meat, approximately 1 oz. of liquid per 3 oz. serving of meat. -CAUTION:  If any foods do not puree into a smooth consistency, swallowing will be more difficult.  (For example, nuts or seeds sometimes do not blend well.)  Hot Foods Cold Foods  Pureed scrambled eggs and cheese Pureed cottage cheese  Baby cereals Thickened juices and nectars  Thinned cooked cereals (no lumps) Thickened milk or eggnog  Pureed [place] toast or pancakes Ensure  Mashed  potatoes Ice cream  Pureed parsley, au gratin, scalloped potatoes, candied sweet potatoes Fruit or [place] ice, sherbet  Pureed buttered or alfredo noodles Plain yogurt  Pureed vegetables (no corn or peas) Instant breakfast  Pureed soups and creamed soups Smooth pudding, mousse, custard  Pureed scalloped apples Whipped gelatin  Gravies Sugar, syrup, honey, jelly  Sauces, cheese, tomato, barbecue, white, creamed Cream  Any baby food Creamer  Alcohol in moderation (not beer or champagne) Margarine  Coffee or tea Mayonnaise   Ketchup, mustard   Apple sauce   SAMPLE MENU:  PUREED DIET Breakfast Lunch Dinner   Orange juice, 1/2  cup  Cream of wheat, 1/2 cup  Pineapple juice, 1/2 cup  Pureed [place], barley soup, 3/4 cup  Pureed Hawaiian chicken, 3 oz   Scrambled eggs, mashed or blended with cheese, 1/2 cup  Tea or coffee, 1 cup   Whole milk, 1 cup   Non-dairy creamer, 2 Tbsp.  Mashed potatoes, 1/2 cup  Pureed cooled broccoli, 1/2 cup  Apple sauce, 1/2 cup  Coffee or tea  Mashed potatoes, 1/2 cup  Pureed spinach, 1/2 cup  Frozen yogurt, 1/2 cup  Tea or coffee      LEVEL 2 = SOFT DIET  After your first 2 weeks, you can advance to a soft diet.   Keep on this diet until everything goes down easily.  Hot Foods Cold Foods  White fish Cottage cheese  Stuffed fish Junior baby fruit  Baby food meals Semi thickened juices  Minced soft cooked, scrambled, poached eggs nectars  Souffle & omelets Ripe mashed bananas  Cooked cereals Canned fruit, pineapple sauce, milk  potatoes Milkshake  Buttered or Alfredo noodles Custard  Cooked cooled vegetable Puddings, including tapioca  Sherbet Yogurt  Vegetable soup or alphabet soup Fruit ice, [place] ice  Gravies Whipped gelatin  Sugar, syrup, honey, jelly Junior baby desserts  Sauces:  Cheese, creamed, barbecue, tomato, white Cream  Coffee or tea Margarine   SAMPLE MENU:  LEVEL 2 Breakfast Lunch Dinner   Orange juice, 1/2 cup  Oatmeal, 1/2 cup  Scrambled eggs with cheese, 1/2 cup  Decaffeinated tea, 1 cup  Whole milk, 1 cup  Non-dairy creamer, 2 Tbsp  Pineapple juice, 1/2 cup  Minced beef, 3 oz  Gravy, 2 Tbsp  Mashed potatoes, 1/2 cup  Minced fresh broccoli, 1/2 cup  Applesauce, 1/2 cup  Coffee, 1 cup  [place], barley soup, 3/4 cup  Minced Hawaiian chicken, 3 oz  Mashed potatoes, 1/2 cup  Cooked spinach, 1/2 cup  Frozen yogurt, 1/2 cup  Non-dairy creamer, 2 Tbsp      LEVEL 3 = CHOPPED DIET  -After all the foods in level 2 (soft diet) are passing through well you should advance up to more chopped foods.  -It is still  important to cut these foods into small pieces and eat slowly.  Hot Foods Cold Foods  Poultry Cottage cheese  Chopped Swedish meatballs Yogurt  Meat salads (ground or flaked meat) Milk  Flaked fish (tuna) Milkshakes  Poached or scrambled eggs Soft, cold, dry cereal  Souffles and omelets Fruit juices or nectars  Cooked cereals Chopped canned fruit  Chopped [place] toast or pancakes Canned fruit cocktail  Noodles or pasta (no rice) Pudding, mousse, custard  Cooked vegetables (no frozen peas, corn, or mixed vegetables) Green salad  Canned small sweet peas Ice cream  Creamed soup or vegetable soup Fruit ice, [place] ice  Pureed vegetable soup or alphabet soup Non-dairy creamer  Ground scalloped  apples Margarine  Gravies Mayonnaise  Sauces:  Cheese, creamed, barbecue, tomato, white Ketchup  Coffee or tea Mustard   SAMPLE MENU:  LEVEL 3 Breakfast Lunch Dinner   Orange juice, 1/2 cup  Oatmeal, 1/2 cup  Scrambled eggs with cheese, 1/2 cup  Decaffeinated tea, 1 cup  Whole milk, 1 cup  Non-dairy creamer, 2 Tbsp  Ketchup, 1 Tbsp  Margarine, 1 tsp  Salt, 1/4 tsp  Sugar, 2 tsp  Pineapple juice, 1/2 cup  Ground beef, 3 oz  Gravy, 2 Tbsp  Mashed potatoes, 1/2 cup  Cooked spinach, 1/2 cup  Applesauce, 1/2 cup  Decaffeinated coffee  Whole milk  Non-dairy creamer, 2 Tbsp  Margarine, 1 tsp  Salt, 1/4 tsp  Pureed [place], barley soup, 3/4 cup  Barbecue chicken, 3 oz  Mashed potatoes, 1/2 cup  Ground fresh broccoli, 1/2 cup  Frozen yogurt, 1/2 cup  Decaffeinated tea, 1 cup  Non-dairy creamer, 2 Tbsp  Margarine, 1 tsp  Salt, 1/4 tsp  Sugar, 1 tsp    LEVEL 4:  REGULAR FOODS  -Foods in this group are soft, moist, regularly textured foods.   -This level includes meat and breads, which tend to be the hardest things to swallow.   -Eat very slowly, chew well and continue to avoid carbonated drinks. -most people are at this level in 4-6 weeks  Hot Foods  Cold Foods  Baked fish or skinned Soft cheeses - cottage cheese  Souffles and omelets Cream cheese  Eggs Yogurt  Stuffed shells Milk  Spaghetti with meat sauce Milkshakes  Cooked cereal Cold dry cereals (no nuts, dried fruit, coconut)  [place] toast or pancakes Crackers  Buttered toast Fruit juices or nectars  Noodles or pasta (no rice) Canned fruit  Potatoes (all types) Ripe bananas  Soft, cooked vegetables (no corn, lima, or baked beans) Peeled, ripe, fresh fruit  Creamed soups or vegetable soup Cakes (no nuts, dried fruit, coconut)  Canned chicken noodle soup Plain doughnuts  Gravies Ice cream  Bacon dressing Pudding, mousse, custard  Sauces:  Cheese, creamed, barbecue, tomato, white Fruit ice, [place] ice, sherbet  Decaffeinated tea or coffee Whipped gelatin  Pork chops Regular gelatin   Canned fruited gelatin molds   Sugar, syrup, honey, jam, jelly   Cream   Non-dairy   Margarine   Oil   Mayonnaise   Ketchup   Mustard    If you have any questions please call our office at [facility]: [phone].  Flatulence Burping releases air that you swallow. The bubbles from some drinks may cause burps. There are good germs in your gut to help you digest food. Gas is produced by these germs and released from your bottom. Most people release 3 to 4 quarts of gas every day. This is normal. HOME CARE  Eat or drink less of the foods or liquids that give you gas.  Take the time to chew your food well. Talk less while you eat.  Do not suck on ice or hard candy.  Sip slowly. Stir some of the bubbles out of fizzy drinks with a spoon or straw.  Avoid chewing gum or smoking.  Ask your doctor about liquids and tablets that may help control burping and gas.  Only take medicine as told by your doctor. GET HELP RIGHT AWAY IF:   There is discomfort when you burp or pass gas.  You throw up (vomit) when you burp.  Poop (stool) comes out when you pass gas.  Your belly is  puffy (swollen) and hard. MAKE SURE YOU:   Understand these instructions.  Will watch your condition.  Will get help right away if you are not doing well or get worse. Document Released: 09/28/2008 Document Revised: 02/18/2012 Document Reviewed: 09/28/2008 [facility] Patient Information 2014 [place], [place].  Back Pain, Adult Low back pain is very common. About 1 in 5 people have back pain.The cause of low back pain is rarely dangerous. The pain often gets better over time.About half of people with a sudden onset of back pain feel better in just 2 weeks. About 8 in 10 people feel better by 6 weeks.  CAUSES Some common causes of back pain include:  Strain of the muscles or ligaments supporting the spine.  Wear and tear (degeneration) of the spinal discs.  Arthritis.  Direct injury to the back. DIAGNOSIS Most of the time, the direct cause of low back pain is not known.However, back pain can be treated effectively even when the exact cause of the pain is unknown.Answering your caregiver's questions about your overall health and symptoms is one of the most accurate ways to make sure the cause of your pain is not dangerous. If your caregiver needs more information, he or she may order lab work or imaging tests (X-rays or MRIs).However, even if imaging tests show changes in your back, this usually does not require surgery. HOME CARE INSTRUCTIONS For many people, back pain returns.Since low back pain is rarely dangerous, it is often a condition that people can learn to [facility] their own.   Remain active. It is stressful on the back to sit or stand in one place. Do not sit, drive, or stand in one place for more than 30 minutes at a time. Take short walks on level surfaces as soon as pain allows.Try to increase the length of time you walk each day.  Do not stay in bed.Resting more than 1 or 2 days can delay your recovery.  Do not avoid exercise or work.Your body is made to move.It is  not dangerous to be active, even though your back may hurt.Your back will likely heal faster if you return to being active before your pain is gone.  Pay attention to your body when you bend and lift. Many people have less discomfortwhen lifting if they bend their knees, keep the load close to their bodies,and avoid twisting. Often, the most comfortable positions are those that put less stress on your recovering back.  Find a comfortable position to sleep. Use a firm mattress and lie on your side with your knees slightly bent. If you lie on your back, put a pillow under your knees.  Only take over-the-counter or prescription medicines as directed by your caregiver. Over-the-counter medicines to reduce pain and inflammation are often the most helpful.Your caregiver may prescribe muscle relaxant drugs.These medicines help dull your pain so you can more quickly return to your normal activities and healthy exercise.  Put ice on the injured area.  Put ice in a plastic bag.  Place a towel between your skin and the bag.  Leave the ice on for 15-20 minutes, 3-4 times a day for the first 2 to 3 days. After that, ice and heat may be alternated to reduce pain and spasms.  Ask your caregiver about trying back exercises and gentle massage. This may be of some benefit.  Avoid feeling anxious or stressed.Stress increases muscle tension and can worsen back pain.It is important to recognize when you are anxious or stressed and learn  ways to manage it.Exercise is a great option. SEEK MEDICAL CARE IF:  You have pain that is not relieved with rest or medicine.  You have pain that does not improve in 1 week.  You have new symptoms.  You are generally not feeling well. SEEK IMMEDIATE MEDICAL CARE IF:   You have pain that radiates from your back into your legs.  You develop new bowel or bladder control problems.  You have unusual weakness or numbness in your arms or legs.  You develop nausea or  vomiting.  You develop abdominal pain.  You feel faint. Document Released: 11/26/2005 Document Revised: 05/27/2012 Document Reviewed: 04/16/2011 [facility] Patient Information 2014 [place], [place].

## 2013-09-07 ENCOUNTER — Encounter (INDEPENDENT_AMBULATORY_CARE_PROVIDER_SITE_OTHER): Payer: Medicare Other | Admitting: Surgery

## 2013-09-07 DIAGNOSIS — J019 Acute sinusitis, unspecified: Secondary | ICD-10-CM | POA: Diagnosis not present

## 2013-09-07 DIAGNOSIS — R0982 Postnasal drip: Secondary | ICD-10-CM | POA: Diagnosis not present

## 2013-09-07 DIAGNOSIS — J069 Acute upper respiratory infection, unspecified: Secondary | ICD-10-CM | POA: Diagnosis not present

## 2013-09-09 ENCOUNTER — Encounter (INDEPENDENT_AMBULATORY_CARE_PROVIDER_SITE_OTHER): Payer: Self-pay | Admitting: Surgery

## 2013-09-09 ENCOUNTER — Ambulatory Visit (INDEPENDENT_AMBULATORY_CARE_PROVIDER_SITE_OTHER): Payer: Medicare Other | Admitting: Surgery

## 2013-09-09 VITALS — BP 124/82 | HR 72 | Temp 97.7°F | Resp 16 | Ht 61.5 in | Wt 133.8 lb

## 2013-09-09 DIAGNOSIS — K449 Diaphragmatic hernia without obstruction or gangrene: Secondary | ICD-10-CM

## 2013-09-09 NOTE — Patient Instructions (Signed)
EATING AFTER YOUR ESOPHAGEAL SURGERY (Stomach Fundoplication, Hiatal Hernia repair, Achalasia surgery, etc)  After your esophageal surgery, expect some sticking with swallowing over the next 1-2 months.    If food sticks when you eat, it is called "dysphagia".  This is due to swelling around your esophagus at the wrap & hiatal diaphragm repair.  It will gradually ease off over the next few months.  To help you through this temporary phase, we start you out on a pureed (blenderized) diet.  Your first meal in the hospital was thin liquids.  You should have been given a pureed diet by the time you left the hospital.  We ask patients to stay on a pureed diet for the first 2-3 weeks to avoid anything getting "stuck" near your recent surgery.  Don't be alarmed if your ability to swallow doesn't progress according to this plan.  Everyone is different and some diets can advance more or less quickly.     Some BASIC RULES to follow are:  Maintain an upright position whenever eating or drinking.  Take small bites - just a teaspoon size bite at a time.  Eat slowly.  It may also help to eat only one food at a time.  Consider nibbling through smaller, more frequent meals & avoid the urge to eat BIG meals  Do not push through feelings of fullness, nausea, or bloatedness  Do not mix solid foods and liquids in the same mouthful  Try not to "wash foods down" with large gulps of liquids. Avoid carbonated (bubbly/fizzy) drinks.  Understand that it will be hard to burp and belch at first.  This gradually improves with time.  Expect to be more gassy/flatulent/bloated initially.  Walking will help you work through that.  Maalox/Gas-X can help as well.  Eat in a relaxed atmosphere & minimize distractions.  Avoid talking while eating.    Do not use straws.  Following each meal, sit in an upright position (90 degree angle) for 60 to 90 minutes.  Going for a short walk can help as well  If food does stick,  don't panic.  Try to relax and let the food pass on its own.  Sipping WARM LIQUID such as strong hot black tea can also help slide it down.   Be gradual in changes & use common sense:  -If you easily tolerating a certain "level" of foods, advance to the next level gradually -If you are having trouble swallowing a particular food, then avoid it.   -If food is sticking when you advance your diet, go back to thinner previous diet (the lower LEVEL) for 1-2 days.  LEVEL 1 = PUREED DIET  Do for the first 2 WEEKS AFTER SURGERY  -Foods in this group are pureed or blenderized to a smooth, mashed potato-like consistency.  -If necessary, the pureed foods can keep their shape with the addition of a thickening agent.   -Meat should be pureed to a smooth, pasty consistency.  Hot broth or gravy may be added to the pureed meat, approximately 1 oz. of liquid per 3 oz. serving of meat. -CAUTION:  If any foods do not puree into a smooth consistency, swallowing will be more difficult.  (For example, nuts or seeds sometimes do not blend well.)  Hot Foods Cold Foods  Pureed scrambled eggs and cheese Pureed cottage cheese  Baby cereals Thickened juices and nectars  Thinned cooked cereals (no lumps) Thickened milk or eggnog  Pureed [place] toast or pancakes Ensure  Mashed  potatoes Ice cream  Pureed parsley, au gratin, scalloped potatoes, candied sweet potatoes Fruit or [place] ice, sherbet  Pureed buttered or alfredo noodles Plain yogurt  Pureed vegetables (no corn or peas) Instant breakfast  Pureed soups and creamed soups Smooth pudding, mousse, custard  Pureed scalloped apples Whipped gelatin  Gravies Sugar, syrup, honey, jelly  Sauces, cheese, tomato, barbecue, white, creamed Cream  Any baby food Creamer  Alcohol in moderation (not beer or champagne) Margarine  Coffee or tea Mayonnaise   Ketchup, mustard   Apple sauce   SAMPLE MENU:  PUREED DIET Breakfast Lunch Dinner   Orange juice, 1/2  cup  Cream of wheat, 1/2 cup  Pineapple juice, 1/2 cup  Pureed [place], barley soup, 3/4 cup  Pureed Hawaiian chicken, 3 oz   Scrambled eggs, mashed or blended with cheese, 1/2 cup  Tea or coffee, 1 cup   Whole milk, 1 cup   Non-dairy creamer, 2 Tbsp.  Mashed potatoes, 1/2 cup  Pureed cooled broccoli, 1/2 cup  Apple sauce, 1/2 cup  Coffee or tea  Mashed potatoes, 1/2 cup  Pureed spinach, 1/2 cup  Frozen yogurt, 1/2 cup  Tea or coffee      LEVEL 2 = SOFT DIET  After your first 2 weeks, you can advance to a soft diet.   Keep on this diet until everything goes down easily.  Hot Foods Cold Foods  White fish Cottage cheese  Stuffed fish Junior baby fruit  Baby food meals Semi thickened juices  Minced soft cooked, scrambled, poached eggs nectars  Souffle & omelets Ripe mashed bananas  Cooked cereals Canned fruit, pineapple sauce, milk  potatoes Milkshake  Buttered or Alfredo noodles Custard  Cooked cooled vegetable Puddings, including tapioca  Sherbet Yogurt  Vegetable soup or alphabet soup Fruit ice, [place] ice  Gravies Whipped gelatin  Sugar, syrup, honey, jelly Junior baby desserts  Sauces:  Cheese, creamed, barbecue, tomato, white Cream  Coffee or tea Margarine   SAMPLE MENU:  LEVEL 2 Breakfast Lunch Dinner   Orange juice, 1/2 cup  Oatmeal, 1/2 cup  Scrambled eggs with cheese, 1/2 cup  Decaffeinated tea, 1 cup  Whole milk, 1 cup  Non-dairy creamer, 2 Tbsp  Pineapple juice, 1/2 cup  Minced beef, 3 oz  Gravy, 2 Tbsp  Mashed potatoes, 1/2 cup  Minced fresh broccoli, 1/2 cup  Applesauce, 1/2 cup  Coffee, 1 cup  [place], barley soup, 3/4 cup  Minced Hawaiian chicken, 3 oz  Mashed potatoes, 1/2 cup  Cooked spinach, 1/2 cup  Frozen yogurt, 1/2 cup  Non-dairy creamer, 2 Tbsp      LEVEL 3 = CHOPPED DIET  -After all the foods in level 2 (soft diet) are passing through well you should advance up to more chopped foods.  -It is still  important to cut these foods into small pieces and eat slowly.  Hot Foods Cold Foods  Poultry Cottage cheese  Chopped Swedish meatballs Yogurt  Meat salads (ground or flaked meat) Milk  Flaked fish (tuna) Milkshakes  Poached or scrambled eggs Soft, cold, dry cereal  Souffles and omelets Fruit juices or nectars  Cooked cereals Chopped canned fruit  Chopped [place] toast or pancakes Canned fruit cocktail  Noodles or pasta (no rice) Pudding, mousse, custard  Cooked vegetables (no frozen peas, corn, or mixed vegetables) Green salad  Canned small sweet peas Ice cream  Creamed soup or vegetable soup Fruit ice, [place] ice  Pureed vegetable soup or alphabet soup Non-dairy creamer  Ground scalloped  apples Margarine  Gravies Mayonnaise  Sauces:  Cheese, creamed, barbecue, tomato, white Ketchup  Coffee or tea Mustard   SAMPLE MENU:  LEVEL 3 Breakfast Lunch Dinner   Orange juice, 1/2 cup  Oatmeal, 1/2 cup  Scrambled eggs with cheese, 1/2 cup  Decaffeinated tea, 1 cup  Whole milk, 1 cup  Non-dairy creamer, 2 Tbsp  Ketchup, 1 Tbsp  Margarine, 1 tsp  Salt, 1/4 tsp  Sugar, 2 tsp  Pineapple juice, 1/2 cup  Ground beef, 3 oz  Gravy, 2 Tbsp  Mashed potatoes, 1/2 cup  Cooked spinach, 1/2 cup  Applesauce, 1/2 cup  Decaffeinated coffee  Whole milk  Non-dairy creamer, 2 Tbsp  Margarine, 1 tsp  Salt, 1/4 tsp  Pureed [place], barley soup, 3/4 cup  Barbecue chicken, 3 oz  Mashed potatoes, 1/2 cup  Ground fresh broccoli, 1/2 cup  Frozen yogurt, 1/2 cup  Decaffeinated tea, 1 cup  Non-dairy creamer, 2 Tbsp  Margarine, 1 tsp  Salt, 1/4 tsp  Sugar, 1 tsp    LEVEL 4:  REGULAR FOODS  -Foods in this group are soft, moist, regularly textured foods.   -This level includes meat and breads, which tend to be the hardest things to swallow.   -Eat very slowly, chew well and continue to avoid carbonated drinks. -most people are at this level in 4-6 weeks  Hot Foods  Cold Foods  Baked fish or skinned Soft cheeses - cottage cheese  Souffles and omelets Cream cheese  Eggs Yogurt  Stuffed shells Milk  Spaghetti with meat sauce Milkshakes  Cooked cereal Cold dry cereals (no nuts, dried fruit, coconut)  [place] toast or pancakes Crackers  Buttered toast Fruit juices or nectars  Noodles or pasta (no rice) Canned fruit  Potatoes (all types) Ripe bananas  Soft, cooked vegetables (no corn, lima, or baked beans) Peeled, ripe, fresh fruit  Creamed soups or vegetable soup Cakes (no nuts, dried fruit, coconut)  Canned chicken noodle soup Plain doughnuts  Gravies Ice cream  Bacon dressing Pudding, mousse, custard  Sauces:  Cheese, creamed, barbecue, tomato, white Fruit ice, [place] ice, sherbet  Decaffeinated tea or coffee Whipped gelatin  Pork chops Regular gelatin   Canned fruited gelatin molds   Sugar, syrup, honey, jam, jelly   Cream   Non-dairy   Margarine   Oil   Mayonnaise   Ketchup   Mustard    If you have any questions please call our office at [facility]: [phone].  LAPAROSCOPIC SURGERY: POST OP INSTRUCTIONS  1. DIET: Follow a light bland diet the first 24 hours after arrival home, such as soup, liquids, crackers, etc.  Be sure to include lots of fluids daily.  Avoid fast food or heavy meals as your are more likely to get nauseated.  Eat a low fat the next few days after surgery.   2. Take your usually prescribed home medications unless otherwise directed. 3. PAIN CONTROL: a. Pain is best controlled by a usual combination of three different methods TOGETHER: i. Ice/Heat ii. Over the counter pain medication iii. Prescription pain medication b. Most patients will experience some swelling and bruising around the incisions.  Ice packs or heating pads (30-60 minutes up to 6 times a day) will help. Use ice for the first few days to help decrease swelling and bruising, then switch to heat to help relax tight/sore spots and speed  recovery.  Some people prefer to use ice alone, heat alone, alternating between ice & heat.  Experiment to what  works for you.  Swelling and bruising can take several weeks to resolve.   c. It is helpful to take an over-the-counter pain medication regularly for the first few weeks.  Choose one of the following that works best for you: i. Naproxen (Aleve, etc)  Two 220mg  tabs twice a day ii. Ibuprofen (Advil, etc) Three 200mg  tabs four times a day (every meal & bedtime) iii. Acetaminophen (Tylenol, etc) 500-650mg  four times a day (every meal & bedtime) d. A  prescription for pain medication (such as oxycodone, hydrocodone, etc) should be given to you upon discharge.  Take your pain medication as prescribed.  i. If you are having problems/concerns with the prescription medicine (does not control pain, nausea, vomiting, rash, itching, etc), please call us [phone] to see if we need to switch you to a different pain medicine that will work better for you and/or control your side effect better. ii. If you need a refill on your pain medication, please contact your pharmacy.  They will contact our office to request authorization. Prescriptions will not be filled after 5 pm or on week-ends. 4. Avoid getting constipated.  Between the surgery and the pain medications, it is common to experience some constipation.  Increasing fluid intake and taking a fiber supplement (such as Metamucil, Citrucel, FiberCon, MiraLax, etc) 1-2 times a day regularly will usually help prevent this problem from occurring.  A mild laxative (prune juice, Milk of Magnesia, MiraLax, etc) should be taken according to package directions if there are no bowel movements after 48 hours.   5. Watch out for diarrhea.  If you have many loose bowel movements, simplify your diet to bland foods & liquids for a few days.  Stop any stool softeners and decrease your fiber supplement.  Switching to mild anti-diarrheal medications (Kayopectate, Pepto  Bismol) can help.  If this worsens or does not improve, please call us. 6. Wash / shower every day.  You may shower over the dressings as they are waterproof.  Continue to shower over incision(s) after the dressing is off. 7. Remove your waterproof bandages 5 days after surgery.  You may leave the incision open to air.  You may replace a dressing/Band-Aid to cover the incision for comfort if you wish.  8. ACTIVITIES as tolerated:   a. You may resume regular (light) daily activities beginning the next day-such as daily self-care, walking, climbing stairs-gradually increasing activities as tolerated.  If you can walk 30 minutes without difficulty, it is safe to try more intense activity such as jogging, treadmill, bicycling, low-impact aerobics, swimming, etc. b. Save the most intensive and strenuous activity for last such as sit-ups, heavy lifting, contact sports, etc  Refrain from any heavy lifting or straining until you are off narcotics for pain control.   c. DO NOT PUSH THROUGH PAIN.  Let pain be your guide: If it hurts to do something, don't do it.  Pain is your body warning you to avoid that activity for another week until the pain goes down. d. You may drive when you are no longer taking prescription pain medication, you can comfortably wear a seatbelt, and you can safely maneuver your car and apply brakes. e. Bonita Quin may have sexual intercourse when it is comfortable.  9. FOLLOW UP in our office a. Please call [facility] at [phone] to set up an appointment to see your surgeon in the office for a follow-up appointment approximately 2-3 weeks after your surgery. b. Make sure that you call for  this appointment the day you arrive home to insure a convenient appointment time. 10. IF YOU HAVE DISABILITY OR FAMILY LEAVE FORMS, BRING THEM TO THE OFFICE FOR PROCESSING.  DO NOT GIVE THEM TO YOUR DOCTOR.   WHEN TO CALL us [phone]: 1. Poor pain control 2. Reactions / problems with new medications  (rash/itching, nausea, etc)  3. Fever over 101.5 F (38.5 C) 4. Inability to urinate 5. Nausea and/or vomiting 6. Worsening swelling or bruising 7. Continued bleeding from incision. 8. Increased pain, redness, or drainage from the incision   The clinic staff is available to answer your questions during regular business hours (8:30am-5pm).  Please don't hesitate to call and ask to speak to one of our nurses for clinical concerns.   If you have a medical emergency, go to the nearest emergency room or call 911.  A surgeon from [facility] is always on call at the [facility], [place] [address], [address], [place], [place]  [address] ? MAIN: [phone] ? TOLL FREE: [phone] ?  FAX [phone] [email or web address]  Diet for Gastroesophageal Reflux Disease, Adult Reflux (acid reflux) is when acid from your stomach flows up into the esophagus. When acid comes in contact with the esophagus, the acid causes irritation and soreness (inflammation) in the esophagus. When reflux happens often or so severely that it causes damage to the esophagus, it is called gastroesophageal reflux disease (GERD). Nutrition therapy can help ease the discomfort of GERD. FOODS OR DRINKS TO AVOID OR LIMIT  Smoking or chewing tobacco. Nicotine is one of the most potent stimulants to acid production in the gastrointestinal tract.  Caffeinated and decaffeinated coffee and black tea.  Regular or low-calorie carbonated beverages or energy drinks (caffeine-free carbonated beverages are allowed).   Strong spices, such as black pepper, white pepper, red pepper, cayenne, curry powder, and chili powder.  Peppermint or spearmint.  Chocolate.  High-fat foods, including meats and fried foods. Extra added fats including oils, butter, salad dressings, and nuts. Limit these to less than 8 tsp per day.  Fruits and vegetables if they are not tolerated, such as  citrus fruits or tomatoes.  Alcohol.  Any food that seems to aggravate your condition. If you have questions regarding your diet, call your caregiver or a registered dietitian. OTHER THINGS THAT MAY HELP GERD INCLUDE:   Eating your meals slowly, in a relaxed setting.  Eating 5 to 6 small meals per day instead of 3 large meals.  Eliminating food for a period of time if it causes distress.  Not lying down until 3 hours after eating a meal.  Keeping the head of your bed raised 6 to 9 inches (15 to 23 cm) by using a foam wedge or blocks under the legs of the bed. Lying flat may make symptoms worse.  Being physically active. Weight loss may be helpful in reducing reflux in overweight or obese adults.  Wear loose fitting clothing EXAMPLE MEAL PLAN This meal plan is approximately 2,000 calories based on https://www.bernard.org/ meal planning guidelines. Breakfast   cup cooked oatmeal.  1 cup strawberries.  1 cup low-fat milk.  1 oz almonds. Snack  1 cup cucumber slices.  6 oz yogurt (made from low-fat or fat-free milk). Lunch  2 slice whole-wheat bread.  2 oz sliced [place].  2 tsp mayonnaise.  1 cup blueberries.  1 cup snap peas. Snack  6 whole-wheat crackers.  1 oz string cheese. Dinner   cup  brown rice.  1 cup mixed veggies.  1 tsp olive oil.  3 oz grilled fish. Document Released: 11/26/2005 Document Revised: 02/18/2012 Document Reviewed: 10/12/2011 [facility] Patient Information 2014 [place], [place].

## 2013-09-09 NOTE — Progress Notes (Signed)
Subjective:     Patient ID: Carol Roberts, female   DOB: 1945/12/27, 67 y.o.   MRN: [id number]  HPI   Carol Roberts  1946-01-01 [id number]  Patient Care Team: Provider Not In System as PCP - General Graylin Shiver, MD as Consulting Physician (Gastroenterology)  Procedure (Date: 07/31/2013):  POST-OPERATIVE DIAGNOSIS: RECURRENT Paraesophageal haital hernia refractory to medical management  PROCEDURE:  -Laparoscopic lysis of adhesions x3 hours (60% of case)  -Type 2 mediastinal dissection.  -Laparoscopic reduction and primary repair of paraesophageal hiatal  Hernia over pledgets.  -Mesh reinforcement of hiatal hernia repair  -Nissen fundoplication 2cm over a 56-French bougie.  -Anterior & posterior gastropexy   SURGEON: Surgeon(s):  Ardeth Sportsman, MD  Mariella Saa, MD - Assist   This patient returns for surgical re-evaluation.  She is doing well except for a cough/cough that is gradually improving. No severe nausea vomiting or retching.  No fevers or chills.  Breathing well.  No pain.  Appetite improving.  Mild dysphagia to solids but improved a lot.  Energy level improving.    Patient Active Problem List   Diagnosis Date Noted  . Back pain, thoracic 08/17/2013  . Paraesophageal hiatal hernia - recurrent 05/25/2013    Past Medical History  Diagnosis Date  . GERD (gastroesophageal reflux disease)   . Paraesophageal hiatal hernia     causes vomiting, chest and left shoulder pain, sometimes left jaw pain  . H/O hiatal hernia     paraesophageal hernia  . Headache(784.0)     ocas migraines  . Anemia     "off and on"  . Arthritis     knees, hands, hips - hx of dislocated hip many yrs ago   . Fibromyalgia     Past Surgical History  Procedure Laterality Date  . Colostomy closure  2010  . Hiatal hernia repair  2005    [place]  . Hiatal hernia repair  2007    Lap with MTF bridge mesh.  Dr Daphine Deutscher,   . Ventral hernia repair  09/06/2010  . Laparoscopic  incisional / umbilical / ventral hernia repair  11/2010    lap VWH with mesh  . Left colectomy  2010    with colostomy for perforated diverticulitis  . Esophageal manometry N/A 06/08/2013    Procedure: ESOPHAGEAL MANOMETRY (EM);  Surgeon: Graylin Shiver, MD;  Location: [facility];  Service: Endoscopy;  Laterality: N/A;  Dr. Bosie Clos will read Manometry for Dr. Evette Cristal  . Colon surgery    . 2011 right knee arthroscopy    . Hernia repair      History   Social History  . Marital Status: Widowed    Spouse Name: N/A    Number of Children: N/A  . Years of Education: N/A   Occupational History  . Not on file.   Social History Main Topics  . Smoking status: Never Smoker   . Smokeless tobacco: Never Used  . Alcohol Use: No  . Drug Use: No  . Sexual Activity: Not on file   Other Topics Concern  . Not on file   Social History Narrative  . No narrative on file    History reviewed. No pertinent family history.  Current Outpatient Prescriptions  Medication Sig Dispense Refill  . acetaminophen (TYLENOL) 500 MG tablet Take 1,000 mg by mouth every 6 (six) hours as needed for pain.      Marland Kitchen azithromycin (ZITHROMAX) 250 MG tablet Take 250 mg by mouth daily.      Marland Kitchen  famotidine (PEPCID) 20 MG tablet Take 20 mg by mouth 2 (two) times daily.      . Multiple Vitamins-Minerals (MULTIVITAMIN WITH MINERALS) tablet Take 1 tablet by mouth daily.      . pantoprazole (PROTONIX) 40 MG tablet Take 40 mg by mouth daily.       Marland Kitchen PRESCRIPTION MEDICATION guaratussin cough syrup       No current facility-administered medications for this visit.     Allergies  Allergen Reactions  . Adhesive [Tape] Rash    BP 124/82  Pulse 72  Temp(Src) 97.7 F (36.5 C) (Temporal)  Resp 16  Ht 5' 1.5" (1.562 m)  Wt 133 lb 12.8 oz (60.691 kg)  BMI 24.87 kg/m2  Dg Chest 2 View  07/23/2013   *RADIOLOGY REPORT*  Clinical Data: Paraesophageal hernia  CHEST - 2 VIEW  Comparison: September 22, 2005  Findings:  There is a  large paraesophageal type hernia present. The lungs are clear.  The heart size and pulmonary vascularity are normal.  No adenopathy.  There is calcification in the aortic arch region.  There is degenerative change in the thoracic spine.  IMPRESSION: Paraesophageal hernia. Lungs clear.  Heart size within normal limits.   Original Report Authenticated By: Bretta Bang, M.D.   Dg Esophagus W/water Sol Cm  08/01/2013   *RADIOLOGY REPORT*  Clinical Data:Paraesophageal hernia repair, Nissen fundoplication.  ESOPHAGUS/BARIUM SWALLOW/TABLET STUDY  Fluoroscopy Time: 10 seconds  Comparison: 05/08/2013  Findings: The scout image demonstrates a regional surgical drain. After the patient ingested water-soluble contrast, there is normal passage across the GE junction without significant obstruction into the decompressed stomach.  Post images show no evidence of leak.  IMPRESSION:  1.  Patent Nissen fundoplication without evidence of leak.   Original Report Authenticated By: D. Andria Rhein, MD     Review of Systems  Constitutional: Negative for fever, chills and diaphoresis.  HENT: Negative for ear pain, sore throat and trouble swallowing.   Eyes: Negative for photophobia and visual disturbance.  Respiratory: Negative for cough and choking.   Cardiovascular: Negative for chest pain and palpitations.  Gastrointestinal: Negative for nausea, vomiting, abdominal pain, diarrhea, constipation, anal bleeding and rectal pain.  Genitourinary: Negative for dysuria, frequency and difficulty urinating.  Musculoskeletal: Negative for myalgias and gait problem.  Skin: Negative for color change, pallor and rash.  Neurological: Negative for dizziness, speech difficulty, weakness and numbness.  Hematological: Negative for adenopathy.  Psychiatric/Behavioral: Negative for confusion and agitation. The patient is not nervous/anxious.        Objective:   Physical Exam  Constitutional: She is oriented to person, place, and  time. She appears well-developed and well-nourished. No distress.  HENT:  Head: Normocephalic.  Mouth/Throat: Oropharynx is clear and moist. No oropharyngeal exudate.  Eyes: Conjunctivae and EOM are normal. Pupils are equal, round, and reactive to light. No scleral icterus.  Neck: Normal range of motion. No tracheal deviation present.  Cardiovascular: Normal rate and intact distal pulses.   Pulmonary/Chest: Effort normal. No respiratory distress. She exhibits no tenderness.  Abdominal: Soft. She exhibits no distension. There is no tenderness. Hernia confirmed negative in the right inguinal area and confirmed negative in the left inguinal area.  Incisions clean with normal healing ridges.  No hernias  Genitourinary: No vaginal discharge found.  Musculoskeletal: Normal range of motion. She exhibits no tenderness.  Lymphadenopathy:       Right: No inguinal adenopathy present.       Left: No inguinal adenopathy present.  Neurological:  She is alert and oriented to person, place, and time. No cranial nerve deficit. She exhibits normal muscle tone. Coordination normal.  Skin: Skin is warm and dry. No rash noted. She is not diaphoretic.  Psychiatric: She has a normal mood and affect. Her behavior is normal.       Assessment:     Recovering well 5 weeks out from second redo paraesophageal hiatal hernia repair     Plan:     I think she that she is doing remarkably well so far.  I noted episodes of dysphagia is common.  Work to keep food slowly and keep it well chewed.  Continue to minimize carbonation as possible.  Use nausea medications when necessary.  Best way to avoid recurrences to avoid retching.  Continue to keep weight down.  Low fat high fiber diet ideal.  Bowel regimen with 30 g fiber a day and fiber supplement as needed to avoid problems.    OK to wean off PPI & H2B   Increase activity as tolerated to regular activity.  Low impact exercise such as walking an hour a day at least  ideal.  Do not push through pain.  Return to clinic PRN.   Instructions discussed.  Followup with primary care physician for other health issues as would normally be done.  Questions answered.  The patient expressed understanding and appreciation

## 2013-10-03 DIAGNOSIS — Z23 Encounter for immunization: Secondary | ICD-10-CM | POA: Diagnosis not present

## 2013-10-15 ENCOUNTER — Other Ambulatory Visit: Payer: Self-pay

## 2013-12-16 ENCOUNTER — Other Ambulatory Visit: Payer: Self-pay | Admitting: Family Medicine

## 2013-12-16 ENCOUNTER — Other Ambulatory Visit (HOSPITAL_COMMUNITY)
Admission: RE | Admit: 2013-12-16 | Discharge: 2013-12-16 | Disposition: A | Discharge status: Home / Self Care | Payer: Medicare Other | Source: Ambulatory Visit | Attending: Family Medicine | Admitting: Family Medicine

## 2013-12-16 DIAGNOSIS — Z1151 Encounter for screening for human papillomavirus (HPV): Secondary | ICD-10-CM | POA: Insufficient documentation

## 2013-12-16 DIAGNOSIS — Z1239 Encounter for other screening for malignant neoplasm of breast: Secondary | ICD-10-CM | POA: Diagnosis not present

## 2013-12-16 DIAGNOSIS — Z124 Encounter for screening for malignant neoplasm of cervix: Secondary | ICD-10-CM | POA: Insufficient documentation

## 2013-12-16 DIAGNOSIS — Z23 Encounter for immunization: Secondary | ICD-10-CM | POA: Diagnosis not present

## 2013-12-16 DIAGNOSIS — R61 Generalized hyperhidrosis: Secondary | ICD-10-CM | POA: Diagnosis not present

## 2013-12-16 DIAGNOSIS — Z0000 Encounter for general adult medical examination without abnormal findings: Secondary | ICD-10-CM | POA: Diagnosis not present

## 2013-12-16 DIAGNOSIS — Z780 Asymptomatic menopausal state: Secondary | ICD-10-CM | POA: Diagnosis not present

## 2013-12-16 DIAGNOSIS — E785 Hyperlipidemia, unspecified: Secondary | ICD-10-CM | POA: Diagnosis not present

## 2013-12-16 DIAGNOSIS — F411 Generalized anxiety disorder: Secondary | ICD-10-CM | POA: Diagnosis not present

## 2013-12-17 ENCOUNTER — Other Ambulatory Visit: Payer: Self-pay

## 2013-12-17 DIAGNOSIS — Z1231 Encounter for screening mammogram for malignant neoplasm of breast: Secondary | ICD-10-CM

## 2014-01-04 DIAGNOSIS — M949 Disorder of cartilage, unspecified: Secondary | ICD-10-CM | POA: Diagnosis not present

## 2014-01-04 DIAGNOSIS — M899 Disorder of bone, unspecified: Secondary | ICD-10-CM | POA: Diagnosis not present

## 2014-01-04 DIAGNOSIS — Z780 Asymptomatic menopausal state: Secondary | ICD-10-CM | POA: Diagnosis not present

## 2014-01-07 ENCOUNTER — Ambulatory Visit
Admission: RE | Admit: 2014-01-07 | Discharge: 2014-01-07 | Disposition: A | Discharge status: Home / Self Care | Payer: Self-pay | Source: Ambulatory Visit

## 2014-01-07 DIAGNOSIS — Z1231 Encounter for screening mammogram for malignant neoplasm of breast: Secondary | ICD-10-CM | POA: Diagnosis not present

## 2014-01-12 ENCOUNTER — Other Ambulatory Visit: Payer: Self-pay | Admitting: Family Medicine

## 2014-01-12 DIAGNOSIS — R928 Other abnormal and inconclusive findings on diagnostic imaging of breast: Secondary | ICD-10-CM

## 2014-01-20 ENCOUNTER — Ambulatory Visit
Admission: RE | Admit: 2014-01-20 | Discharge: 2014-01-20 | Disposition: A | Discharge status: Home / Self Care | Payer: Medicare Other | Source: Ambulatory Visit | Attending: Family Medicine | Admitting: Family Medicine

## 2014-01-20 ENCOUNTER — Other Ambulatory Visit: Payer: Self-pay | Admitting: Family Medicine

## 2014-01-20 DIAGNOSIS — N6489 Other specified disorders of breast: Secondary | ICD-10-CM | POA: Diagnosis not present

## 2014-01-20 DIAGNOSIS — R928 Other abnormal and inconclusive findings on diagnostic imaging of breast: Secondary | ICD-10-CM

## 2014-02-10 ENCOUNTER — Encounter (INDEPENDENT_AMBULATORY_CARE_PROVIDER_SITE_OTHER): Payer: Medicare Other | Admitting: Ophthalmology

## 2014-02-10 DIAGNOSIS — H43819 Vitreous degeneration, unspecified eye: Secondary | ICD-10-CM | POA: Diagnosis not present

## 2014-02-10 DIAGNOSIS — H3530 Unspecified macular degeneration: Secondary | ICD-10-CM

## 2014-02-10 DIAGNOSIS — H33309 Unspecified retinal break, unspecified eye: Secondary | ICD-10-CM

## 2014-02-10 DIAGNOSIS — H2510 Age-related nuclear cataract, unspecified eye: Secondary | ICD-10-CM | POA: Diagnosis not present

## 2014-02-11 DIAGNOSIS — F411 Generalized anxiety disorder: Secondary | ICD-10-CM | POA: Diagnosis not present

## 2014-02-25 ENCOUNTER — Ambulatory Visit (INDEPENDENT_AMBULATORY_CARE_PROVIDER_SITE_OTHER): Payer: Medicare Other | Admitting: Ophthalmology

## 2014-02-25 DIAGNOSIS — H33309 Unspecified retinal break, unspecified eye: Secondary | ICD-10-CM

## 2014-03-29 ENCOUNTER — Emergency Department (HOSPITAL_COMMUNITY): Payer: Medicare Other

## 2014-03-29 ENCOUNTER — Encounter (HOSPITAL_COMMUNITY): Payer: Self-pay | Admitting: Emergency Medicine

## 2014-03-29 ENCOUNTER — Inpatient Hospital Stay (HOSPITAL_COMMUNITY)
Admission: EM | Admit: 2014-03-29 | Discharge: 2014-03-30 | DRG: 390 | Disposition: A | Discharge status: Home / Self Care | Payer: Medicare Other | Attending: Internal Medicine | Admitting: Internal Medicine

## 2014-03-29 DIAGNOSIS — M129 Arthropathy, unspecified: Secondary | ICD-10-CM | POA: Diagnosis present

## 2014-03-29 DIAGNOSIS — R7309 Other abnormal glucose: Secondary | ICD-10-CM | POA: Diagnosis not present

## 2014-03-29 DIAGNOSIS — K219 Gastro-esophageal reflux disease without esophagitis: Secondary | ICD-10-CM | POA: Diagnosis present

## 2014-03-29 DIAGNOSIS — R935 Abnormal findings on diagnostic imaging of other abdominal regions, including retroperitoneum: Secondary | ICD-10-CM | POA: Diagnosis not present

## 2014-03-29 DIAGNOSIS — K56609 Unspecified intestinal obstruction, unspecified as to partial versus complete obstruction: Principal | ICD-10-CM | POA: Diagnosis present

## 2014-03-29 DIAGNOSIS — R739 Hyperglycemia, unspecified: Secondary | ICD-10-CM | POA: Diagnosis present

## 2014-03-29 DIAGNOSIS — R109 Unspecified abdominal pain: Secondary | ICD-10-CM | POA: Diagnosis not present

## 2014-03-29 DIAGNOSIS — H409 Unspecified glaucoma: Secondary | ICD-10-CM | POA: Diagnosis present

## 2014-03-29 DIAGNOSIS — Z79899 Other long term (current) drug therapy: Secondary | ICD-10-CM

## 2014-03-29 DIAGNOSIS — K56600 Partial intestinal obstruction, unspecified as to cause: Secondary | ICD-10-CM | POA: Diagnosis present

## 2014-03-29 DIAGNOSIS — K432 Incisional hernia without obstruction or gangrene: Secondary | ICD-10-CM | POA: Diagnosis not present

## 2014-03-29 DIAGNOSIS — IMO0001 Reserved for inherently not codable concepts without codable children: Secondary | ICD-10-CM | POA: Diagnosis present

## 2014-03-29 DIAGNOSIS — R110 Nausea: Secondary | ICD-10-CM | POA: Diagnosis not present

## 2014-03-29 LAB — CBC WITH DIFFERENTIAL/PLATELET
BASOS ABS: 0 10*3/uL (ref 0.0–0.1)
BASOS PCT: 0 % (ref 0–1)
Eosinophils Absolute: 0 10*3/uL (ref 0.0–0.7)
Eosinophils Relative: 0 % (ref 0–5)
HEMATOCRIT: 43.1 % (ref 36.0–46.0)
HEMOGLOBIN: 14.7 g/dL (ref 12.0–15.0)
LYMPHS PCT: 9 % — AB (ref 12–46)
Lymphs Abs: 1.7 10*3/uL (ref 0.7–4.0)
MCH: 31.8 pg (ref 26.0–34.0)
MCHC: 34.1 g/dL (ref 30.0–36.0)
MCV: 93.3 fL (ref 78.0–100.0)
MONO ABS: 0.9 10*3/uL (ref 0.1–1.0)
MONOS PCT: 5 % (ref 3–12)
NEUTROS ABS: 15.5 10*3/uL — AB (ref 1.7–7.7)
NEUTROS PCT: 85 % — AB (ref 43–77)
Platelets: 269 10*3/uL (ref 150–400)
RBC: 4.62 MIL/uL (ref 3.87–5.11)
RDW: 13.6 % (ref 11.5–15.5)
WBC: 18.1 10*3/uL — AB (ref 4.0–10.5)

## 2014-03-29 LAB — HEMOGLOBIN A1C
Hgb A1c MFr Bld: 5.3 % (ref <5.7)
MEAN PLASMA GLUCOSE: 105 mg/dL (ref <117)

## 2014-03-29 LAB — URINALYSIS, ROUTINE W REFLEX MICROSCOPIC
Bilirubin Urine: NEGATIVE
Glucose, UA: NEGATIVE mg/dL
Hgb urine dipstick: NEGATIVE
Ketones, ur: NEGATIVE mg/dL
NITRITE: NEGATIVE
PH: 5 (ref 5.0–8.0)
PROTEIN: NEGATIVE mg/dL
Specific Gravity, Urine: 1.029 (ref 1.005–1.030)
Urobilinogen, UA: 0.2 mg/dL (ref 0.0–1.0)

## 2014-03-29 LAB — COMPREHENSIVE METABOLIC PANEL
ALBUMIN: 4.7 g/dL (ref 3.5–5.2)
ALK PHOS: 81 U/L (ref 39–117)
ALT: 24 U/L (ref 0–35)
AST: 27 U/L (ref 0–37)
BUN: 27 mg/dL — AB (ref 6–23)
CALCIUM: 10.3 mg/dL (ref 8.4–10.5)
CO2: 24 mEq/L (ref 19–32)
Chloride: 97 mEq/L (ref 96–112)
Creatinine, Ser: 0.87 mg/dL (ref 0.50–1.10)
GFR calc Af Amer: 79 mL/min — ABNORMAL LOW (ref >90)
GFR calc non Af Amer: 68 mL/min — ABNORMAL LOW (ref >90)
GLUCOSE: 174 mg/dL — AB (ref 70–99)
POTASSIUM: 4.7 meq/L (ref 3.7–5.3)
SODIUM: 137 meq/L (ref 137–147)
Total Bilirubin: 0.4 mg/dL (ref 0.3–1.2)
Total Protein: 8.4 g/dL — ABNORMAL HIGH (ref 6.0–8.3)

## 2014-03-29 LAB — PROTIME-INR
INR: 0.94 (ref 0.00–1.49)
Prothrombin Time: 12.4 seconds (ref 11.6–15.2)

## 2014-03-29 LAB — URINE MICROSCOPIC-ADD ON

## 2014-03-29 LAB — LIPASE, BLOOD: LIPASE: 27 U/L (ref 11–59)

## 2014-03-29 LAB — GLUCOSE, CAPILLARY
GLUCOSE-CAPILLARY: 100 mg/dL — AB (ref 70–99)
GLUCOSE-CAPILLARY: 90 mg/dL (ref 70–99)
Glucose-Capillary: 100 mg/dL — ABNORMAL HIGH (ref 70–99)

## 2014-03-29 LAB — APTT: aPTT: 25 seconds (ref 24–37)

## 2014-03-29 MED ORDER — HYDROMORPHONE HCL PF 1 MG/ML IJ SOLN
1.0000 mg | Freq: Once | INTRAMUSCULAR | Status: AC
  Administered 2014-03-29: 1 mg via INTRAVENOUS
  Filled 2014-03-29: qty 1

## 2014-03-29 MED ORDER — FAMOTIDINE IN NACL 20-0.9 MG/50ML-% IV SOLN
20.0000 mg | Freq: Once | INTRAVENOUS | Status: AC
  Administered 2014-03-29: 20 mg via INTRAVENOUS
  Filled 2014-03-29: qty 50

## 2014-03-29 MED ORDER — SODIUM CHLORIDE 0.9 % IV BOLUS (SEPSIS)
1000.0000 mL | Freq: Once | INTRAVENOUS | Status: AC
  Administered 2014-03-29: 1000 mL via INTRAVENOUS

## 2014-03-29 MED ORDER — IOHEXOL 300 MG/ML  SOLN
50.0000 mL | Freq: Once | INTRAMUSCULAR | Status: AC | PRN
  Administered 2014-03-29: 50 mL via ORAL

## 2014-03-29 MED ORDER — ONDANSETRON HCL 4 MG/2ML IJ SOLN
4.0000 mg | Freq: Once | INTRAMUSCULAR | Status: AC
  Administered 2014-03-29: 4 mg via INTRAVENOUS
  Filled 2014-03-29: qty 2

## 2014-03-29 MED ORDER — IOHEXOL 300 MG/ML  SOLN
100.0000 mL | Freq: Once | INTRAMUSCULAR | Status: AC | PRN
  Administered 2014-03-29: 100 mL via INTRAVENOUS

## 2014-03-29 MED ORDER — ONDANSETRON HCL 4 MG/2ML IJ SOLN
INTRAMUSCULAR | Status: AC
  Administered 2014-03-29: 4 mg
  Filled 2014-03-29: qty 2

## 2014-03-29 MED ORDER — ONDANSETRON HCL 4 MG/2ML IJ SOLN: 4.0000 mg | Freq: Four times a day (QID) | INTRAMUSCULAR | Status: DC | PRN

## 2014-03-29 MED ORDER — LACTATED RINGERS IV SOLN
INTRAVENOUS | Status: AC
  Administered 2014-03-29: 11:00:00 via INTRAVENOUS

## 2014-03-29 MED ORDER — INSULIN ASPART 100 UNIT/ML ~~LOC~~ SOLN: 0.0000 [IU] | SUBCUTANEOUS | Status: DC

## 2014-03-29 MED ORDER — MORPHINE SULFATE 2 MG/ML IJ SOLN
1.0000 mg | INTRAMUSCULAR | Status: DC | PRN
  Administered 2014-03-29: 1 mg via INTRAVENOUS
  Filled 2014-03-29: qty 1

## 2014-03-29 MED ORDER — SODIUM CHLORIDE 0.9 % IV SOLN
INTRAVENOUS | Status: DC
  Administered 2014-03-29 – 2014-03-30 (×2): via INTRAVENOUS

## 2014-03-29 NOTE — ED Notes (Signed)
Pt reports that she is having upper abdominal pain radiating to her back, reports that she has a hx of hiatal hernias and needed repair, last in August 2014. Pt dry heaving in triage and tearful. Pt a&o x4.

## 2014-03-29 NOTE — ED Notes (Addendum)
CBG 174 Needs address by Hospitalist. [facility] RN made aware.

## 2014-03-29 NOTE — H&P (Signed)
[facility] History and Physical  JODINE MUCHMORE ZOX:[id number] DOB: 1946-05-14 DOA: 03/29/2014  Referring physician: Derwood Kaplan, ER physician PCP: PROVIDER NOT IN SYSTEM   Chief Complaint: Abdominal pain with nausea and vomiting  HPI: CYNIAH GOSSARD is a 68 y.o. female  Past medical history of previous small bowel obstruction who presented to the emergency room after one to 2 days of abdominal pain mostly in the upper areas with some radiation to the back and associated nausea and vomiting. Patient came in the emergency room for evaluation and was noted to have a white count of 18.1 and a CT scan which noted signs consistent with a small bowel obstruction. Lipase level was normal. Patient was given medicine for pain and nausea which greatly improved her symptoms. Chest x-ray and urinalysis were negative for other signs of infection. She was admitted to the hospitalist service.   Review of Systems:  Patient seen after arrival to floor. Patient doing okay. Denies any headaches, vision changes, dysphagia, chest pain or palpitations. Still having some mild midepigastric abdominal pain with some radiation to the back. Mild nausea. Denies any hematuria, dysuria, constipation, diarrhea, focal extremity numbness or weakness or pain. Review systems otherwise negative.  Past Medical History  Diagnosis Date  . GERD (gastroesophageal reflux disease)   . Paraesophageal hiatal hernia     causes vomiting, chest and left shoulder pain, sometimes left jaw pain  . H/O hiatal hernia     paraesophageal hernia  . Headache(784.0)     ocas migraines  . Anemia     "off and on"  . Arthritis     knees, hands, hips - hx of dislocated hip many yrs ago   . Fibromyalgia    Past Surgical History  Procedure Laterality Date  . Colostomy closure  2010  . Hiatal hernia repair  2005    [place]  . Hiatal hernia repair  2007    Lap with MTF bridge mesh.  Dr Daphine Deutscher,   . Ventral hernia repair   09/06/2010  . Laparoscopic incisional / umbilical / ventral hernia repair  11/2010    lap VWH with mesh  . Left colectomy  2010    with colostomy for perforated diverticulitis  . Esophageal manometry N/A 06/08/2013    Procedure: ESOPHAGEAL MANOMETRY (EM);  Surgeon: Graylin Shiver, MD;  Location: [facility];  Service: Endoscopy;  Laterality: N/A;  Dr. Bosie Clos will read Manometry for Dr. Evette Cristal  . Colon surgery    . 2011 right knee arthroscopy    . Hernia repair     Social History:  reports that she has never smoked. She has never used smokeless tobacco. She reports that she does not drink alcohol or use illicit drugs.  Allergies  Allergen Reactions  . Adhesive [Tape] Rash    Family history: Hypertension  Prior to Admission medications   Medication Sig Start Date End Date Taking? Authorizing Provider  acetaminophen (TYLENOL) 500 MG tablet Take 1,000 mg by mouth every 6 (six) hours as needed for pain.   Yes Historical Provider, MD  calcium-vitamin D (OSCAL WITH D) 500-200 MG-UNIT per tablet Take 1 tablet by mouth 2 (two) times daily.   Yes Historical Provider, MD  Multiple Vitamins-Minerals (MULTIVITAMIN WITH MINERALS) tablet Take 1 tablet by mouth daily.    Yes Historical Provider, MD  Multiple Vitamins-Minerals (PRESERVISION AREDS 2) CAPS Take 1 tablet by mouth 2 (two) times daily.   Yes Historical Provider, MD  ondansetron (ZOFRAN-ODT) 8 MG disintegrating tablet  Take 8 mg by mouth once.    Yes Historical Provider, MD  Wheat Dextrin (BENEFIBER DRINK MIX PO) Take 10 mLs by mouth daily.   Yes Historical Provider, MD   Physical Exam: Filed Vitals:   03/29/14 1339  BP: 142/79  Pulse: 86  Temp: 98.2 F (36.8 C)  Resp: 18    BP 142/79  Pulse 86  Temp(Src) 98.2 F (36.8 C) (Oral)  Resp 18  Ht 5\' 1"  (1.549 m)  Wt 67.586 kg (149 lb)  BMI 28.17 kg/m2  SpO2 95%  General:  Appears calm and comfortable, no acute distress Eyes: Sclera nonicteric, extraocular movements are  intact ENT: Normocephalic and atraumatic, mucous members are slightly dry Neck: Supple, no JVD Cardiovascular: Regular rate and rhythm, S1-S2,  Respiratory: Clear to auscultation bilaterally Abdomen: Soft, nontender, nondistended, scant bowel sounds Skin: No Skin breaks, tears or lesions Musculoskeletal: No clubbing or cyanosis or edema Psychiatric: Patient is appropriate, no evidence of psychoses Neurologic: No overt deficits           Labs on Admission:  Basic Metabolic Panel:  Recent Labs Lab 03/29/14 0310  NA 137  K 4.7  CL 97  CO2 24  GLUCOSE 174*  BUN 27*  CREATININE 0.87  CALCIUM 10.3   Liver Function Tests:  Recent Labs Lab 03/29/14 0310  AST 27  ALT 24  ALKPHOS 81  BILITOT 0.4  PROT 8.4*  ALBUMIN 4.7    Recent Labs Lab 03/29/14 0310  LIPASE 27   No results found for this basename: AMMONIA,  in the last 168 hours CBC:  Recent Labs Lab 03/29/14 0310  WBC 18.1*  NEUTROABS 15.5*  HGB 14.7  HCT 43.1  MCV 93.3  PLT 269   Cardiac Enzymes: No results found for this basename: CKTOTAL, CKMB, CKMBINDEX, TROPONINI,  in the last 168 hours  BNP (last 3 results) No results found for this basename: PROBNP,  in the last 8760 hours CBG: No results found for this basename: GLUCAP,  in the last 168 hours  Radiological Exams on Admission: Ct Abdomen Pelvis W Contrast  03/29/2014   CLINICAL DATA:  Upper abdominal pain, nausea and vomiting. Elevated white cell count.  EXAM: CT ABDOMEN AND PELVIS WITH CONTRAST  TECHNIQUE: Multidetector CT imaging of the abdomen and pelvis was performed using the standard protocol following bolus administration of intravenous contrast.  CONTRAST:  100mL OMNIPAQUE IOHEXOL 300 MG/ML  SOLN  COMPARISON:  DG UGI W/HIGH DENSITY W/KUB dated 05/08/2013; CT ABD/PELVIS W CM dated 10/12/2010; DG CHEST 1V PORT dated 03/29/2014  FINDINGS: Lung bases are clear. Prominent gaseous distention of the stomach with gas and fluid dilatation of the small  bowel to the terminal ileum without focal transition zone noted. Changes could be due to either partial obstruction or ileus. No significant bowel wall thickening. The colon is stool filled without distention.  Multiple postoperative changes including anastomosis in the rectosigmoid colon and, mesh hernia repairs throughout the abdominal wall, and surgical clips in the upper epigastric region/EG junction. No free air or free fluid. There is a small recurrent hernia in the inferior pelvic abdominal wall, containing fluid. There is no definite bowel content.  The liver, spleen, gallbladder, pancreas, adrenal glands, kidneys, abdominal aorta, inferior vena cava, and retroperitoneal lymph nodes are unremarkable.  Pelvis: Small amount of free fluid in the pelvis is likely physiologic. Uterus and ovaries are not enlarged. Appendix is not specifically identified. No diverticulitis. Degenerative changes in the spine. No destructive bone lesions.  IMPRESSION: Distention of the stomach with gas and entire small bowel with mostly fluid suggesting either partial obstruction or ileus. Nonspecific gastroenteritis is also possibility. Recurrent anterior abdominal wall hernia in the low pelvis without definite bowel herniation. Multiple postoperative changes.   Electronically Signed   By: Burman NievesWilliam  Stevens M.D.   On: 03/29/2014 06:02   Dg Chest Port 1 View  03/29/2014   CLINICAL DATA:  Sudden onset upper abdominal pain and nausea.  EXAM: PORTABLE CHEST - 1 VIEW  COMPARISON:  DG ESOPHAGUS W/ WATER SOL CM dated 08/01/2013; DG CHEST 2 VIEW dated 07/23/2013  FINDINGS: Shallow inspiration. The heart size and mediastinal contours are within normal limits. Both lungs are clear. The visualized skeletal structures are unremarkable.  IMPRESSION: No active disease.   Electronically Signed   By: Burman NievesWilliam  Stevens M.D.   On: 03/29/2014 03:45     Assessment/Plan Active Problems:   Partial small bowel obstruction: Repeat films in the  morning. Gently hydrate. Nausea and pain control    Hyperglycemia: No sugar of 174. No previous A1c to compare. Check one now. Cover CBGs    Code Status: Full code  Family Communication: Spoke with daughter by phone   Disposition Plan: Home when SBO resolved\.  Time spent: 25 min  Hollice EspySendil K Toan Mort [facility] Pager [phone]

## 2014-03-29 NOTE — Progress Notes (Signed)
UR completed 

## 2014-03-30 ENCOUNTER — Inpatient Hospital Stay (HOSPITAL_COMMUNITY): Payer: Medicare Other

## 2014-03-30 DIAGNOSIS — K56609 Unspecified intestinal obstruction, unspecified as to partial versus complete obstruction: Secondary | ICD-10-CM | POA: Diagnosis not present

## 2014-03-30 DIAGNOSIS — R7309 Other abnormal glucose: Secondary | ICD-10-CM | POA: Diagnosis not present

## 2014-03-30 DIAGNOSIS — R935 Abnormal findings on diagnostic imaging of other abdominal regions, including retroperitoneum: Secondary | ICD-10-CM | POA: Diagnosis not present

## 2014-03-30 LAB — GLUCOSE, CAPILLARY
GLUCOSE-CAPILLARY: 129 mg/dL — AB (ref 70–99)
GLUCOSE-CAPILLARY: 63 mg/dL — AB (ref 70–99)
Glucose-Capillary: 107 mg/dL — ABNORMAL HIGH (ref 70–99)
Glucose-Capillary: 71 mg/dL (ref 70–99)
Glucose-Capillary: 62 mg/dL — ABNORMAL LOW (ref 70–99)
Glucose-Capillary: 66 mg/dL — ABNORMAL LOW (ref 70–99)

## 2014-03-30 LAB — BASIC METABOLIC PANEL
BUN: 12 mg/dL (ref 6–23)
CALCIUM: 8.4 mg/dL (ref 8.4–10.5)
CO2: 24 mEq/L (ref 19–32)
CREATININE: 0.63 mg/dL (ref 0.50–1.10)
Chloride: 108 mEq/L (ref 96–112)
GFR calc non Af Amer: >90 mL/min (ref >90)
GLUCOSE: 95 mg/dL (ref 70–99)
Potassium: 3.6 mEq/L — ABNORMAL LOW (ref 3.7–5.3)
Sodium: 142 mEq/L (ref 137–147)

## 2014-03-30 LAB — CBC
HCT: 32.8 % — ABNORMAL LOW (ref 36.0–46.0)
Hemoglobin: 11.3 g/dL — ABNORMAL LOW (ref 12.0–15.0)
MCH: 33 pg (ref 26.0–34.0)
MCHC: 34.8 g/dL (ref 30.0–36.0)
MCV: 95.1 fL (ref 78.0–100.0)
Platelets: 190 10*3/uL (ref 150–400)
RBC: 3.45 MIL/uL — ABNORMAL LOW (ref 3.87–5.11)
RDW: 13.8 % (ref 11.5–15.5)
WBC: 5.2 10*3/uL (ref 4.0–10.5)

## 2014-03-30 MED ORDER — DEXTROSE 50 % IV SOLN
INTRAVENOUS | Status: AC
  Administered 2014-03-30: 25 mL
  Filled 2014-03-30: qty 50

## 2014-03-30 NOTE — ED Provider Notes (Signed)
CSN: [id number]     Arrival date & time 03/29/14  0145 History   First MD Initiated Contact with Patient 03/29/14 0255     Chief Complaint  Patient presents with  . Abdominal Pain  . Back Pain     (Consider location/radiation/quality/duration/timing/severity/associated sxs/prior Treatment) HPI Comments: 68 y.o. female with past medical history of previous small bowel obstruction who presented to the emergency room after one to 2 days of abdominal pain mostly in the upper areas with some radiation to the back and associated nausea and vomiting. States it started around 7pm and has gotten progressively worse.  Associated with nausea, coughing, and dry heaves.  Has not vomited.  States the pain is different from the hiatal hernia pain she experienced with her last episode  Tried Tylenol which hasn't helped.  Rates pain 10/10.  Denies fevers, recent illness, chest pain, SOB, urinary problems, diarrhea.  Patient is a 68 y.o. female presenting with abdominal pain and back pain. The history is provided by the patient and medical records.  Abdominal Pain Associated symptoms: nausea   Associated symptoms: no chest pain, no dysuria, no shortness of breath and no vomiting   Back Pain Associated symptoms: abdominal pain   Associated symptoms: no chest pain, no dysuria and no headaches     Past Medical History  Diagnosis Date  . GERD (gastroesophageal reflux disease)   . Paraesophageal hiatal hernia     causes vomiting, chest and left shoulder pain, sometimes left jaw pain  . H/O hiatal hernia     paraesophageal hernia  . Headache(784.0)     ocas migraines  . Anemia     "off and on"  . Arthritis     knees, hands, hips - hx of dislocated hip many yrs ago   . Fibromyalgia    Past Surgical History  Procedure Laterality Date  . Colostomy closure  2010  . Hiatal hernia repair  2005    [place]  . Hiatal hernia repair  2007    Lap with MTF bridge mesh.  Dr Daphine DeutscherMartin,   . Ventral hernia  repair  09/06/2010  . Laparoscopic incisional / umbilical / ventral hernia repair  11/2010    lap VWH with mesh  . Left colectomy  2010    with colostomy for perforated diverticulitis  . Esophageal manometry N/A 06/08/2013    Procedure: ESOPHAGEAL MANOMETRY (EM);  Surgeon: Graylin ShiverSalem F Ganem, MD;  Location: [facility];  Service: Endoscopy;  Laterality: N/A;  Dr. Bosie ClosSchooler will read Manometry for Dr. Evette CristalGanem  . Colon surgery    . 2011 right knee arthroscopy    . Hernia repair     History reviewed. No pertinent family history. History  Substance Use Topics  . Smoking status: Never Smoker   . Smokeless tobacco: Never Used  . Alcohol Use: No   OB History   Grav Para Term Preterm Abortions TAB SAB Ect Mult Living                 Review of Systems  Constitutional: Positive for activity change.  Respiratory: Negative for shortness of breath.   Cardiovascular: Negative for chest pain.  Gastrointestinal: Positive for nausea and abdominal pain. Negative for vomiting.  Genitourinary: Negative for dysuria.  Musculoskeletal: Positive for back pain. Negative for neck pain.  Neurological: Negative for headaches.  All other systems reviewed and are negative.     Allergies  Adhesive  Home Medications   Prior to Admission medications   Medication Sig  Start Date End Date Taking? Authorizing Provider  acetaminophen (TYLENOL) 500 MG tablet Take 1,000 mg by mouth every 6 (six) hours as needed for pain.   Yes Historical Provider, MD  calcium-vitamin D (OSCAL WITH D) 500-200 MG-UNIT per tablet Take 1 tablet by mouth 2 (two) times daily.   Yes Historical Provider, MD  Multiple Vitamins-Minerals (MULTIVITAMIN WITH MINERALS) tablet Take 1 tablet by mouth daily.    Yes Historical Provider, MD  Multiple Vitamins-Minerals (PRESERVISION AREDS 2) CAPS Take 1 tablet by mouth 2 (two) times daily.   Yes Historical Provider, MD  ondansetron (ZOFRAN-ODT) 8 MG disintegrating tablet Take 8 mg by mouth once.    Yes  Historical Provider, MD  Wheat Dextrin (BENEFIBER DRINK MIX PO) Take 10 mLs by mouth daily.   Yes Historical Provider, MD   BP 126/54  Pulse 88  Temp(Src) 98.5 F (36.9 C) (Oral)  Resp 18  Ht 5\' 1"  (1.549 m)  Wt 149 lb (67.586 kg)  BMI 28.17 kg/m2  SpO2 96% Physical Exam  Nursing note and vitals reviewed. Constitutional: She is oriented to person, place, and time. She appears well-developed.  HENT:  Head: Normocephalic and atraumatic.  Eyes: Conjunctivae and EOM are normal. Pupils are equal, round, and reactive to light.  Neck: Normal range of motion. Neck supple.  Cardiovascular: Normal rate, regular rhythm and normal heart sounds.   Pulmonary/Chest: Effort normal and breath sounds normal. No respiratory distress.  Abdominal: Soft. Bowel sounds are normal. She exhibits no distension. There is tenderness in the epigastric area. There is no rebound and no guarding.  Neurological: She is alert and oriented to person, place, and time.  Skin: Skin is warm and dry.    ED Course  Procedures (including critical care time) Labs Review Labs Reviewed  CBC WITH DIFFERENTIAL - Abnormal; Notable for the following:    WBC 18.1 (*)    Neutrophils Relative % 85 (*)    Neutro Abs 15.5 (*)    Lymphocytes Relative 9 (*)    All other components within normal limits  COMPREHENSIVE METABOLIC PANEL - Abnormal; Notable for the following:    Glucose, Bld 174 (*)    BUN 27 (*)    Total Protein 8.4 (*)    GFR calc non Af Amer 68 (*)    GFR calc Af Amer 79 (*)    All other components within normal limits  URINALYSIS, ROUTINE W REFLEX MICROSCOPIC - Abnormal; Notable for the following:    Leukocytes, UA TRACE (*)    All other components within normal limits  URINE MICROSCOPIC-ADD ON - Abnormal; Notable for the following:    Casts HYALINE CASTS (*)    All other components within normal limits  GLUCOSE, CAPILLARY - Abnormal; Notable for the following:    Glucose-Capillary 100 (*)    All other  components within normal limits  BASIC METABOLIC PANEL - Abnormal; Notable for the following:    Potassium 3.6 (*)    All other components within normal limits  CBC - Abnormal; Notable for the following:    RBC 3.45 (*)    Hemoglobin 11.3 (*)    HCT 32.8 (*)    All other components within normal limits  GLUCOSE, CAPILLARY - Abnormal; Notable for the following:    Glucose-Capillary 100 (*)    All other components within normal limits  GLUCOSE, CAPILLARY - Abnormal; Notable for the following:    Glucose-Capillary 107 (*)    All other components within normal limits  LIPASE,  BLOOD  APTT  PROTIME-INR  HEMOGLOBIN A1C  GLUCOSE, CAPILLARY  GLUCOSE, CAPILLARY    Imaging Review Ct Abdomen Pelvis W Contrast  03/29/2014   CLINICAL DATA:  Upper abdominal pain, nausea and vomiting. Elevated white cell count.  EXAM: CT ABDOMEN AND PELVIS WITH CONTRAST  TECHNIQUE: Multidetector CT imaging of the abdomen and pelvis was performed using the standard protocol following bolus administration of intravenous contrast.  CONTRAST:  100mL OMNIPAQUE IOHEXOL 300 MG/ML  SOLN  COMPARISON:  DG UGI W/HIGH DENSITY W/KUB dated 05/08/2013; CT ABD/PELVIS W CM dated 10/12/2010; DG CHEST 1V PORT dated 03/29/2014  FINDINGS: Lung bases are clear. Prominent gaseous distention of the stomach with gas and fluid dilatation of the small bowel to the terminal ileum without focal transition zone noted. Changes could be due to either partial obstruction or ileus. No significant bowel wall thickening. The colon is stool filled without distention.  Multiple postoperative changes including anastomosis in the rectosigmoid colon and, mesh hernia repairs throughout the abdominal wall, and surgical clips in the upper epigastric region/EG junction. No free air or free fluid. There is a small recurrent hernia in the inferior pelvic abdominal wall, containing fluid. There is no definite bowel content.  The liver, spleen, gallbladder, pancreas, adrenal  glands, kidneys, abdominal aorta, inferior vena cava, and retroperitoneal lymph nodes are unremarkable.  Pelvis: Small amount of free fluid in the pelvis is likely physiologic. Uterus and ovaries are not enlarged. Appendix is not specifically identified. No diverticulitis. Degenerative changes in the spine. No destructive bone lesions.  IMPRESSION: Distention of the stomach with gas and entire small bowel with mostly fluid suggesting either partial obstruction or ileus. Nonspecific gastroenteritis is also possibility. Recurrent anterior abdominal wall hernia in the low pelvis without definite bowel herniation. Multiple postoperative changes.   Electronically Signed   By: Burman NievesWilliam  Stevens M.D.   On: 03/29/2014 06:02   Dg Chest Port 1 View  03/29/2014   CLINICAL DATA:  Sudden onset upper abdominal pain and nausea.  EXAM: PORTABLE CHEST - 1 VIEW  COMPARISON:  DG ESOPHAGUS W/ WATER SOL CM dated 08/01/2013; DG CHEST 2 VIEW dated 07/23/2013  FINDINGS: Shallow inspiration. The heart size and mediastinal contours are within normal limits. Both lungs are clear. The visualized skeletal structures are unremarkable.  IMPRESSION: No active disease.   Electronically Signed   By: Burman NievesWilliam  Stevens M.D.   On: 03/29/2014 03:45     EKG Interpretation None      MDM   Final diagnoses:  Partial small bowel obstruction    DDx includes: Pancreatitis Hepatobiliary pathology including cholecystitis Gastritis/PUD SBO ACS syndrome Aortic Dissection   Pt with hx of hiatal hernia and abd surgeries comes in with severe abd pain. Has leukocytosis. CT abd shows partial SBO. Discussed with patient about d/c with close follow up VS. Admission - and she prefers the latter, given how severe her pain was and persistence of mild pain with nausea. Will admit.  Derwood KaplanAnkit Kelden Lavallee, MD 03/30/14 [phone]

## 2014-03-30 NOTE — Discharge Summary (Signed)
Physician Discharge Summary  Carol Roberts EAV:[id number] DOB: Jul 11, 1946 DOA: 03/29/2014  PCP: Shirlean MylarWEBB, CAROL, D, MD  Admit date: 03/29/2014 Discharge date: 03/30/2014  Time spent: 25 minutes  Recommendations for Outpatient Follow-up:  1. Patient will follow up with her PCP, Dr. Hyman HopesWebb, in the next month  Discharge Diagnoses:  Active Problems:   Partial small bowel obstruction Glaucoma   Hyperglycemia   Discharge Condition: Improved, being discharged home  Diet recommendation: Regular  Filed Weights   03/29/14 1107  Weight: 67.586 kg (149 lb)    History of present illness:  68 year old white female past medical history previous small bowel obstruction who presented to emergency room on 4/20 with complaints of one to 2 days of abdominal pain the upper areas with some radiation to the back with associated nausea and vomiting. Lipase level normal. White blood cell count 18.1. CT scan noted signs consistent with small bowel obstruction. Workup was otherwise negative including normal chest x-ray and urinalysis. Patient admitted to the hospitalist service.  Hospital Course:  Active Problems:   Partial small bowel obstruction: Patient treated with nausea and pain control. Followup x-ray done 4/21 notes advancement of contrast from original CT scan consistent with resolving small bowel obstruction. Patient started on clear liquids for lunch and tolerated well. Diet advanced which patient tolerated and she was discharged home on 4/21 evening    Hyperglycemia: Patient had an admitting blood sugar of 174. CBG since then have been normal. A1c normal. No evidence of diabetes.  Glaucoma: Patient continued on PreserVision vitamin upon discharge  Procedures:  None  Consultations:  None  Discharge Exam: Filed Vitals:   03/30/14 1312  BP: 143/72  Pulse: 79  Temp: 98.2 F (36.8 C)  Resp: 18    General: Alert and oriented x3, no acute distress Cardiovascular: Regular rate and rhythm,  S1-S2 Respiratory: Clear to auscultation bilaterally Abdomen: Soft, nontender, nondistended, hypoactive bowel sounds  Discharge Instructions You were cared for by a hospitalist during your hospital stay. If you have any questions about your discharge medications or the care you received while you were in the hospital after you are discharged, you can call the unit and asked to speak with the hospitalist on call if the hospitalist that took care of you is not available. Once you are discharged, your primary care physician will handle any further medical issues. Please note that NO REFILLS for any discharge medications will be authorized once you are discharged, as it is imperative that you return to your primary care physician (or establish a relationship with a primary care physician if you do not have one) for your aftercare needs so that they can reassess your need for medications and monitor your lab values.   Future Appointments Provider Department Dept Phone   07/01/2014 8:30 AM Sherrie GeorgeJohn D Matthews, MD [facility] [phone]       Medication List         acetaminophen 500 MG tablet  Commonly known as:  TYLENOL  Take 1,000 mg by mouth every 6 (six) hours as needed for pain.     BENEFIBER DRINK MIX PO  Take 10 mLs by mouth daily.     calcium-vitamin D 500-200 MG-UNIT per tablet  Commonly known as:  OSCAL WITH D  Take 1 tablet by mouth 2 (two) times daily.     multivitamin with minerals tablet  Take 1 tablet by mouth daily.     PRESERVISION AREDS 2 Caps  Take 1 tablet  by mouth 2 (two) times daily.     ondansetron 8 MG disintegrating tablet  Commonly known as:  ZOFRAN-ODT  Take 8 mg by mouth once.       Allergies  Allergen Reactions  . Adhesive [Tape] Rash       Follow-up Information   Follow up with WEBB, CAROL, D, MD In 1 month.   Specialty:  Family Medicine   Contact information:   [address] [place]  [address] [phone]        The results of significant diagnostics from this hospitalization (including imaging, microbiology, ancillary and laboratory) are listed below for reference.    Significant Diagnostic Studies: Abd 1 View (kub)  03/30/2014   CLINICAL DATA:  Follow-up small bowel obstruction.  EXAM: ABDOMEN - 1 VIEW  COMPARISON:  CT ABD/PELVIS W CM dated 03/29/2014  FINDINGS: Marked improvement in the bowel gas pattern with distal progression of contrast in the colon. Findings are compatible with resolving partial small bowel obstruction. Tiny amount of gas is present within the rectosigmoid. Oral contrast has reached the proximal descending colon. Surgical tacks associated with ventral hernia repair.  No gross evidence of free air. No dilated loops of small or large bowel.  IMPRESSION: Distal progression of oral contrast compatible with resolving partial small bowel obstruction.   Electronically Signed   By: Andreas Newport M.D.   On: 03/30/2014 08:17   Ct Abdomen Pelvis W Contrast  03/29/2014     IMPRESSION: Distention of the stomach with gas and entire small bowel with mostly fluid suggesting either partial obstruction or ileus. Nonspecific gastroenteritis is also possibility. Recurrent anterior abdominal wall hernia in the low pelvis without definite bowel herniation. Multiple postoperative changes.   Electronically Signed   By: Burman Nieves M.D.   On: 03/29/2014 06:02   Dg Chest Port 1 View  03/29/2014   CLINICAL DATA:  Sudden onset upper abdominal pain and nausea.  EXAM: PORTABLE CHEST - 1 VIEW  COMPARISON:  DG ESOPHAGUS W/ WATER SOL CM dated 08/01/2013; DG CHEST 2 VIEW dated 07/23/2013  FINDINGS: Shallow inspiration. The heart size and mediastinal contours are within normal limits. Both lungs are clear. The visualized skeletal structures are unremarkable.  IMPRESSION: No active disease.   Electronically Signed   By: Burman Nieves M.D.   On: 03/29/2014 03:45    Microbiology: No  results found for this or any previous visit (from the past 240 hour(s)).   Labs: Basic Metabolic Panel:  Recent Labs Lab 03/29/14 0310 03/30/14 0405  NA 137 142  K 4.7 3.6*  CL 97 108  CO2 24 24  GLUCOSE 174* 95  BUN 27* 12  CREATININE 0.87 0.63  CALCIUM 10.3 8.4   Liver Function Tests:  Recent Labs Lab 03/29/14 0310  AST 27  ALT 24  ALKPHOS 81  BILITOT 0.4  PROT 8.4*  ALBUMIN 4.7    Recent Labs Lab 03/29/14 0310  LIPASE 27   No results found for this basename: AMMONIA,  in the last 168 hours CBC:  Recent Labs Lab 03/29/14 0310 03/30/14 0405  WBC 18.1* 5.2  NEUTROABS 15.5*  --   HGB 14.7 11.3*  HCT 43.1 32.8*  MCV 93.3 95.1  PLT 269 190   Cardiac Enzymes: No results found for this basename: CKTOTAL, CKMB, CKMBINDEX, TROPONINI,  in the last 168 hours BNP: BNP (last 3 results) No results found for this basename: PROBNP,  in the last 8760 hours CBG:  Recent Labs  Lab 03/30/14 0401 03/30/14 0734 03/30/14 1212 03/30/14 1226 03/30/14 1256  GLUCAP 107* 71 66* 62* 129*       Signed:  Kambre Messner K Taletha Twiford  [facility] 03/30/2014, 3:38 PM

## 2014-03-30 NOTE — Discharge Instructions (Signed)
Small Bowel Obstruction A small bowel obstruction is a blockage (obstruction) of the small intestine (small bowel). The small bowel is a long, slender tube that connects the stomach to the colon. Its job is to absorb nutrients from the fluids and foods you consume into the bloodstream.  CAUSES  There are many causes of intestinal blockage. The most common ones include:  Hernias. This is a more common cause in children than adults.  Inflammatory bowel disease (enteritis and colitis).  Twisting of the bowel (volvulus).  Tumors.  Scar tissue (adhesions) from previous surgery or radiation treatment.  Recent surgery. This may cause an acute small bowel obstruction called an ileus. SYMPTOMS   Abdominal pain. This may be dull cramps or sharp pain. It may occur in one area or may be present in the entire abdomen. Pain can range from mild to severe, depending on the degree of obstruction.  Nausea and vomiting. Vomit may be greenish or yellow bile color.  Distended or swollen stomach. Abdominal bloating is a common symptom.  Constipation.  Lack of passing gas.  Frequent belching.  Diarrhea. This may occur if runny stool is able to leak around the obstruction. DIAGNOSIS  Your caregiver can usually diagnose small bowel obstruction by taking a history, doing a physical exam, and taking X-rays. If the cause is unclear, a CT scan (computerized tomography) of your abdomen and pelvis may be needed. TREATMENT  Treatment of the blockage depends on the cause and how bad the problem is.   Sometimes, the obstruction improves with bed rest and intravenous (IV) fluids.  Resting the bowel is very important. This means following a simple diet. Sometimes, a clear liquid diet may be required for several days.  Sometimes, a small tube (nasogastric tube) is placed into the stomach to decompress the bowel. When the bowel is blocked, it usually swells up like a balloon filled with air and fluids.  Decompression means that the air and fluids are removed by suction through that tube. This can help with pain, discomfort, and nausea. It can also help the obstruction resolve faster.  Surgery may be required if other treatments do not work. Bowel obstruction from a hernia may require early surgery and can be an emergency procedure. Adhesions that cause frequent or severe obstructions may also require surgery. HOME CARE INSTRUCTIONS If your bowel obstruction is only partial or incomplete, you may be allowed to go home.  Get plenty of rest.  Follow your diet as directed by your caregiver.  Only consume clear liquids until your condition improves.  Avoid solid foods as instructed. SEEK IMMEDIATE MEDICAL CARE IF:  You have increased pain or cramping.  You vomit blood.  You have uncontrolled vomiting or nausea.  You cannot drink fluids due to vomiting or pain.  You develop confusion.  You begin feeling very dry or thirsty (dehydrated).  You have severe bloating.  You have chills.  You have a fever.  You feel extremely weak or you faint. MAKE SURE YOU:  Understand these instructions.  Will watch your condition.  Will get help right away if you are not doing well or get worse. Document Released: 02/12/2006 Document Revised: 02/18/2012 Document Reviewed: 02/09/2011 [employer] Patient Information 2014 [employer].  

## 2014-04-01 DIAGNOSIS — K149 Disease of tongue, unspecified: Secondary | ICD-10-CM | POA: Diagnosis not present

## 2014-04-01 DIAGNOSIS — Z09 Encounter for follow-up examination after completed treatment for conditions other than malignant neoplasm: Secondary | ICD-10-CM | POA: Diagnosis not present

## 2014-04-01 DIAGNOSIS — K56609 Unspecified intestinal obstruction, unspecified as to partial versus complete obstruction: Secondary | ICD-10-CM | POA: Diagnosis not present

## 2014-04-28 DIAGNOSIS — K589 Irritable bowel syndrome without diarrhea: Secondary | ICD-10-CM | POA: Diagnosis not present

## 2014-04-28 DIAGNOSIS — R928 Other abnormal and inconclusive findings on diagnostic imaging of breast: Secondary | ICD-10-CM | POA: Diagnosis not present

## 2014-04-28 DIAGNOSIS — Z09 Encounter for follow-up examination after completed treatment for conditions other than malignant neoplasm: Secondary | ICD-10-CM | POA: Diagnosis not present

## 2014-04-29 ENCOUNTER — Other Ambulatory Visit: Payer: Self-pay | Admitting: Family Medicine

## 2014-04-29 DIAGNOSIS — N63 Unspecified lump in unspecified breast: Secondary | ICD-10-CM

## 2014-05-02 ENCOUNTER — Emergency Department (HOSPITAL_COMMUNITY): Payer: Medicare Other

## 2014-05-02 ENCOUNTER — Inpatient Hospital Stay (HOSPITAL_COMMUNITY)
Admission: EM | Admit: 2014-05-02 | Discharge: 2014-05-04 | DRG: 390 | Disposition: A | Discharge status: Home / Self Care | Payer: Medicare Other | Attending: General Surgery | Admitting: General Surgery

## 2014-05-02 ENCOUNTER — Encounter (HOSPITAL_COMMUNITY): Payer: Self-pay | Admitting: Emergency Medicine

## 2014-05-02 DIAGNOSIS — K56609 Unspecified intestinal obstruction, unspecified as to partial versus complete obstruction: Principal | ICD-10-CM | POA: Diagnosis present

## 2014-05-02 DIAGNOSIS — K5669 Other intestinal obstruction: Secondary | ICD-10-CM | POA: Diagnosis not present

## 2014-05-02 DIAGNOSIS — Z9089 Acquired absence of other organs: Secondary | ICD-10-CM | POA: Diagnosis not present

## 2014-05-02 DIAGNOSIS — IMO0001 Reserved for inherently not codable concepts without codable children: Secondary | ICD-10-CM | POA: Diagnosis present

## 2014-05-02 DIAGNOSIS — Z79899 Other long term (current) drug therapy: Secondary | ICD-10-CM

## 2014-05-02 DIAGNOSIS — R109 Unspecified abdominal pain: Secondary | ICD-10-CM | POA: Diagnosis not present

## 2014-05-02 DIAGNOSIS — M1710 Unilateral primary osteoarthritis, unspecified knee: Secondary | ICD-10-CM | POA: Diagnosis present

## 2014-05-02 DIAGNOSIS — R1084 Generalized abdominal pain: Secondary | ICD-10-CM | POA: Diagnosis not present

## 2014-05-02 DIAGNOSIS — K219 Gastro-esophageal reflux disease without esophagitis: Secondary | ICD-10-CM | POA: Diagnosis present

## 2014-05-02 DIAGNOSIS — M1610 Unilateral primary osteoarthritis, unspecified hip: Secondary | ICD-10-CM | POA: Diagnosis present

## 2014-05-02 DIAGNOSIS — M19049 Primary osteoarthritis, unspecified hand: Secondary | ICD-10-CM | POA: Diagnosis present

## 2014-05-02 DIAGNOSIS — IMO0002 Reserved for concepts with insufficient information to code with codable children: Secondary | ICD-10-CM

## 2014-05-02 LAB — URINALYSIS, ROUTINE W REFLEX MICROSCOPIC
Bilirubin Urine: NEGATIVE
Glucose, UA: NEGATIVE mg/dL
Hgb urine dipstick: NEGATIVE
Ketones, ur: NEGATIVE mg/dL
LEUKOCYTES UA: NEGATIVE
Nitrite: NEGATIVE
PH: 5 (ref 5.0–8.0)
Protein, ur: NEGATIVE mg/dL
SPECIFIC GRAVITY, URINE: 1.023 (ref 1.005–1.030)
Urobilinogen, UA: 0.2 mg/dL (ref 0.0–1.0)

## 2014-05-02 LAB — COMPREHENSIVE METABOLIC PANEL
ALBUMIN: 4.2 g/dL (ref 3.5–5.2)
ALT: 19 U/L (ref 0–35)
AST: 23 U/L (ref 0–37)
Alkaline Phosphatase: 71 U/L (ref 39–117)
BUN: 19 mg/dL (ref 6–23)
CALCIUM: 10 mg/dL (ref 8.4–10.5)
CHLORIDE: 97 meq/L (ref 96–112)
CO2: 23 mEq/L (ref 19–32)
CREATININE: 0.73 mg/dL (ref 0.50–1.10)
GFR calc Af Amer: >90 mL/min (ref >90)
GFR, EST NON AFRICAN AMERICAN: 87 mL/min — AB (ref >90)
Glucose, Bld: 141 mg/dL — ABNORMAL HIGH (ref 70–99)
Potassium: 4.4 mEq/L (ref 3.7–5.3)
Sodium: 137 mEq/L (ref 137–147)
Total Bilirubin: 0.3 mg/dL (ref 0.3–1.2)
Total Protein: 7.5 g/dL (ref 6.0–8.3)

## 2014-05-02 LAB — CBC WITH DIFFERENTIAL/PLATELET
BASOS ABS: 0 10*3/uL (ref 0.0–0.1)
Basophils Relative: 0 % (ref 0–1)
EOS PCT: 0 % (ref 0–5)
Eosinophils Absolute: 0 10*3/uL (ref 0.0–0.7)
HCT: 42.7 % (ref 36.0–46.0)
Hemoglobin: 14.3 g/dL (ref 12.0–15.0)
LYMPHS PCT: 8 % — AB (ref 12–46)
Lymphs Abs: 1.1 10*3/uL (ref 0.7–4.0)
MCH: 31.3 pg (ref 26.0–34.0)
MCHC: 33.5 g/dL (ref 30.0–36.0)
MCV: 93.4 fL (ref 78.0–100.0)
Monocytes Absolute: 0.5 10*3/uL (ref 0.1–1.0)
Monocytes Relative: 4 % (ref 3–12)
NEUTROS ABS: 11 10*3/uL — AB (ref 1.7–7.7)
Neutrophils Relative %: 88 % — ABNORMAL HIGH (ref 43–77)
Platelets: 225 10*3/uL (ref 150–400)
RBC: 4.57 MIL/uL (ref 3.87–5.11)
RDW: 13.6 % (ref 11.5–15.5)
WBC: 12.5 10*3/uL — AB (ref 4.0–10.5)

## 2014-05-02 LAB — LIPASE, BLOOD: Lipase: 15 U/L (ref 11–59)

## 2014-05-02 MED ORDER — IOHEXOL 300 MG/ML  SOLN
100.0000 mL | Freq: Once | INTRAMUSCULAR | Status: AC | PRN
  Administered 2014-05-02: 100 mL via INTRAVENOUS

## 2014-05-02 MED ORDER — HEPARIN SODIUM (PORCINE) 5000 UNIT/ML IJ SOLN
5000.0000 [IU] | Freq: Three times a day (TID) | INTRAMUSCULAR | Status: DC
  Administered 2014-05-02 – 2014-05-04 (×7): 5000 [IU] via SUBCUTANEOUS
  Filled 2014-05-02 (×9): qty 1

## 2014-05-02 MED ORDER — BIOTENE DRY MOUTH MT LIQD
15.0000 mL | Freq: Two times a day (BID) | OROMUCOSAL | Status: DC
  Administered 2014-05-02 – 2014-05-04 (×3): 15 mL via OROMUCOSAL

## 2014-05-02 MED ORDER — PANTOPRAZOLE SODIUM 40 MG IV SOLR
40.0000 mg | Freq: Every day | INTRAVENOUS | Status: DC
  Administered 2014-05-02 – 2014-05-03 (×2): 40 mg via INTRAVENOUS
  Filled 2014-05-02 (×3): qty 40

## 2014-05-02 MED ORDER — MORPHINE SULFATE 4 MG/ML IJ SOLN
4.0000 mg | Freq: Once | INTRAMUSCULAR | Status: AC
  Administered 2014-05-02: 4 mg via INTRAVENOUS
  Filled 2014-05-02: qty 1

## 2014-05-02 MED ORDER — ONDANSETRON HCL 4 MG/2ML IJ SOLN
4.0000 mg | Freq: Once | INTRAMUSCULAR | Status: AC
  Administered 2014-05-02: 4 mg via INTRAVENOUS
  Filled 2014-05-02: qty 2

## 2014-05-02 MED ORDER — MORPHINE SULFATE 2 MG/ML IJ SOLN
2.0000 mg | INTRAMUSCULAR | Status: DC | PRN
  Administered 2014-05-02 (×2): 2 mg via INTRAVENOUS
  Filled 2014-05-02 (×2): qty 1

## 2014-05-02 MED ORDER — ONDANSETRON HCL 4 MG/2ML IJ SOLN: 4.0000 mg | Freq: Four times a day (QID) | INTRAMUSCULAR | Status: DC | PRN

## 2014-05-02 MED ORDER — IOHEXOL 300 MG/ML  SOLN
50.0000 mL | Freq: Once | INTRAMUSCULAR | Status: AC | PRN
  Administered 2014-05-02: 50 mL via ORAL

## 2014-05-02 MED ORDER — KCL IN DEXTROSE-NACL 20-5-0.9 MEQ/L-%-% IV SOLN
INTRAVENOUS | Status: DC
  Administered 2014-05-02: 14:00:00 via INTRAVENOUS
  Administered 2014-05-03: 75 mL via INTRAVENOUS
  Administered 2014-05-03: 23:00:00 via INTRAVENOUS
  Filled 2014-05-02 (×5): qty 1000

## 2014-05-02 NOTE — H&P (Signed)
Carol Roberts is an 68 y.o. female.   Chief Complaint: abdominal pain HPI:  The pt is a 68 yo wf who has a history of multiple abdominal surgeries who presents with abdominal pain and nausea that started last night. She has had nausea but no vomiting. She has had 2 hiatal hernia repairs. She did have a bm with flatus at around 1am. She had an episode similar to this about a month ago that resolved without surgery. Her last ventral hernia repair with mesh was done in 2011  Past Medical History  Diagnosis Date  . GERD (gastroesophageal reflux disease)   . Paraesophageal hiatal hernia     causes vomiting, chest and left shoulder pain, sometimes left jaw pain  . H/O hiatal hernia     paraesophageal hernia  . Headache(784.0)     ocas migraines  . Anemia     "off and on"  . Arthritis     knees, hands, hips - hx of dislocated hip many yrs ago   . Fibromyalgia     Past Surgical History  Procedure Laterality Date  . Colostomy closure  2010  . Hiatal hernia repair  2005    [place]  . Hiatal hernia repair  2007    Lap with MTF bridge mesh.  Dr Hassell Done,   . Ventral hernia repair  09/06/2010  . Laparoscopic incisional / umbilical / ventral hernia repair  11/2010    lap VWH with mesh  . Left colectomy  2010    with colostomy for perforated diverticulitis  . Esophageal manometry N/A 06/08/2013    Procedure: ESOPHAGEAL MANOMETRY (EM);  Surgeon: Wonda Horner, MD;  Location: [facility];  Service: Endoscopy;  Laterality: N/A;  Dr. Michail Sermon will read Manometry for Dr. Penelope Coop  . Colon surgery    . 2011 right knee arthroscopy    . Hernia repair    . Appendectomy      No family history on file. Social History:  reports that she has never smoked. She has never used smokeless tobacco. She reports that she does not drink alcohol or use illicit drugs.  Allergies:  Allergies  Allergen Reactions  . Adhesive [Tape] Rash     (Not in a hospital admission)  Results for orders placed during  the hospital encounter of 05/02/14 (from the past 48 hour(s))  CBC WITH DIFFERENTIAL     Status: Abnormal   Collection Time    05/02/14  8:10 AM      Result Value Ref Range   WBC 12.5 (*) 4.0 - 10.5 K/uL   RBC 4.57  3.87 - 5.11 MIL/uL   Hemoglobin 14.3  12.0 - 15.0 g/dL   HCT 42.7  36.0 - 46.0 %   MCV 93.4  78.0 - 100.0 fL   MCH 31.3  26.0 - 34.0 pg   MCHC 33.5  30.0 - 36.0 g/dL   RDW 13.6  11.5 - 15.5 %   Platelets 225  150 - 400 K/uL   Neutrophils Relative % 88 (*) 43 - 77 %   Neutro Abs 11.0 (*) 1.7 - 7.7 K/uL   Lymphocytes Relative 8 (*) 12 - 46 %   Lymphs Abs 1.1  0.7 - 4.0 K/uL   Monocytes Relative 4  3 - 12 %   Monocytes Absolute 0.5  0.1 - 1.0 K/uL   Eosinophils Relative 0  0 - 5 %   Eosinophils Absolute 0.0  0.0 - 0.7 K/uL   Basophils Relative 0  0 - 1 %   Basophils Absolute 0.0  0.0 - 0.1 K/uL  COMPREHENSIVE METABOLIC PANEL     Status: Abnormal   Collection Time    05/02/14  8:10 AM      Result Value Ref Range   Sodium 137  137 - 147 mEq/L   Potassium 4.4  3.7 - 5.3 mEq/L   Chloride 97  96 - 112 mEq/L   CO2 23  19 - 32 mEq/L   Glucose, Bld 141 (*) 70 - 99 mg/dL   BUN 19  6 - 23 mg/dL   Creatinine, Ser 0.73  0.50 - 1.10 mg/dL   Calcium 10.0  8.4 - 10.5 mg/dL   Total Protein 7.5  6.0 - 8.3 g/dL   Albumin 4.2  3.5 - 5.2 g/dL   AST 23  0 - 37 U/L   ALT 19  0 - 35 U/L   Alkaline Phosphatase 71  39 - 117 U/L   Total Bilirubin 0.3  0.3 - 1.2 mg/dL   GFR calc non Af Amer 87 (*) >90 mL/min   GFR calc Af Amer >90  >90 mL/min   Comment: (NOTE)     The eGFR has been calculated using the CKD EPI equation.     This calculation has not been validated in all clinical situations.     eGFR's persistently <90 mL/min signify possible Chronic Kidney     Disease.  LIPASE, BLOOD     Status: None   Collection Time    05/02/14  8:10 AM      Result Value Ref Range   Lipase 15  11 - 59 U/L  URINALYSIS, ROUTINE W REFLEX MICROSCOPIC     Status: None   Collection Time    05/02/14   9:06 AM      Result Value Ref Range   Color, Urine YELLOW  YELLOW   APPearance CLEAR  CLEAR   Specific Gravity, Urine 1.023  1.005 - 1.030   pH 5.0  5.0 - 8.0   Glucose, UA NEGATIVE  NEGATIVE mg/dL   Hgb urine dipstick NEGATIVE  NEGATIVE   Bilirubin Urine NEGATIVE  NEGATIVE   Ketones, ur NEGATIVE  NEGATIVE mg/dL   Protein, ur NEGATIVE  NEGATIVE mg/dL   Urobilinogen, UA 0.2  0.0 - 1.0 mg/dL   Nitrite NEGATIVE  NEGATIVE   Leukocytes, UA NEGATIVE  NEGATIVE   Comment: MICROSCOPIC NOT DONE ON URINES WITH NEGATIVE PROTEIN, BLOOD, LEUKOCYTES, NITRITE, OR GLUCOSE <1000 mg/dL.   Ct Abdomen Pelvis W Contrast  05/02/2014   CLINICAL DATA:  68 year old female with abdominal pelvic pain. History of bowel obstructions.  EXAM: CT ABDOMEN AND PELVIS WITH CONTRAST  TECHNIQUE: Multidetector CT imaging of the abdomen and pelvis was performed using the standard protocol following bolus administration of intravenous contrast.  CONTRAST:  171m OMNIPAQUE IOHEXOL 300 MG/ML  SOLN  COMPARISON:  03/29/2014 and prior CTs  FINDINGS: Dilated small bowel loops are identified with a transition point in the low anterior pelvis region. No discrete obstructing cause is identified and likely related to an adhesion.  A small amount of free pelvic fluid is identified. There is no evidence of pneumoperitoneum or focal abscess.  Surgical changes within the anterior abdominal and pelvic wall noted.  Mild cardiomegaly noted.  The liver, spleen, pancreas, kidneys and adrenal glands are unremarkable.  There is no evidence of biliary dilatation, abdominal aortic aneurysm or enlarged lymph nodes.  No acute or suspicious bony abnormalities are noted.  IMPRESSION: High-grade  small bowel obstruction with transition point in the low anterior pelvis, likely related to an adhesion. Small amount of free pelvic fluid without pneumoperitoneum or abscess.   Electronically Signed   By: Hassan Rowan M.D.   On: 05/02/2014 09:38    Review of Systems   Constitutional: Negative.   HENT: Negative.   Eyes: Negative.   Respiratory: Negative.   Cardiovascular: Negative.   Gastrointestinal: Positive for nausea and abdominal pain. Negative for vomiting.  Genitourinary: Negative.   Musculoskeletal: Positive for back pain.  Skin: Negative.   Neurological: Negative.   Endo/Heme/Allergies: Negative.   Psychiatric/Behavioral: Negative.     Blood pressure 155/84, pulse 86, temperature 98.7 F (37.1 C), temperature source Oral, resp. rate 18, SpO2 99.00%. Physical Exam  Constitutional: She is oriented to person, place, and time. She appears well-developed and well-nourished.  HENT:  Head: Normocephalic and atraumatic.  Eyes: Conjunctivae and EOM are normal. Pupils are equal, round, and reactive to light.  Neck: Normal range of motion. Neck supple.  Cardiovascular: Normal rate, regular rhythm and normal heart sounds.   Respiratory: Effort normal and breath sounds normal.  GI: Soft. Bowel sounds are normal.  There is only mild distension. There is mild left sided tenderness. No guarding or peritonitis  Musculoskeletal: Normal range of motion.  Neurological: She is alert and oriented to person, place, and time.  Skin: Skin is warm and dry.  Psychiatric: She has a normal mood and affect. Her behavior is normal.     Assessment/Plan The pt appears to have at least a partial sbo most likely secondary to scar tissue from previous surgery. Since she looks well we will plan to admit her to the hospital for bowel rest and IV hydration. Will hold off on ng for now given her 2 previous Nissen's. Will repeat abdominal xrays in am  Luella Cook III 05/02/2014, 10:35 AM

## 2014-05-02 NOTE — ED Provider Notes (Signed)
CSN: [id number]     Arrival date & time 05/02/14  5003 History   First MD Initiated Contact with Patient 05/02/14 [phone]     Chief Complaint  Patient presents with  . Abdominal Pain     (Consider location/radiation/quality/duration/timing/severity/associated sxs/prior Treatment) HPI Comments: Patient with a history of SBO and multiple abdominal surgeries for Hernia and Colectomy presents today with a chief complaint of diffuse abdominal pain.  She reports acute onset of the pain at 11 PM last evening.  Pain has been constant and gradually worsening since that time.  Pain radiates to the back.  She reports that the pain feels similar to the pain that she had with the SBO in the past.  She reports associated nausea.  She reports dry heaving, but no actual vomiting.  She also reports that she has been constipated.  Last BM was this morning, which she reports was very small.  She has taken Fiber and Dulcolax without results.  She denies fever, chills, urinary symptoms, or blood in her stool.  The history is provided by the patient.    Past Medical History  Diagnosis Date  . GERD (gastroesophageal reflux disease)   . Paraesophageal hiatal hernia     causes vomiting, chest and left shoulder pain, sometimes left jaw pain  . H/O hiatal hernia     paraesophageal hernia  . Headache(784.0)     ocas migraines  . Anemia     "off and on"  . Arthritis     knees, hands, hips - hx of dislocated hip many yrs ago   . Fibromyalgia    Past Surgical History  Procedure Laterality Date  . Colostomy closure  2010  . Hiatal hernia repair  2005    [place]  . Hiatal hernia repair  2007    Lap with MTF bridge mesh.  Dr Daphine Deutscher,   . Ventral hernia repair  09/06/2010  . Laparoscopic incisional / umbilical / ventral hernia repair  11/2010    lap VWH with mesh  . Left colectomy  2010    with colostomy for perforated diverticulitis  . Esophageal manometry N/A 06/08/2013    Procedure: ESOPHAGEAL MANOMETRY  (EM);  Surgeon: Graylin Shiver, MD;  Location: [facility];  Service: Endoscopy;  Laterality: N/A;  Dr. Bosie Clos will read Manometry for Dr. Evette Cristal  . Colon surgery    . 2011 right knee arthroscopy    . Hernia repair    . Appendectomy     No family history on file. History  Substance Use Topics  . Smoking status: Never Smoker   . Smokeless tobacco: Never Used  . Alcohol Use: No   OB History   Grav Para Term Preterm Abortions TAB SAB Ect Mult Living                 Review of Systems  Constitutional: Negative for fever and chills.  Gastrointestinal: Positive for nausea, abdominal pain, constipation and abdominal distention.  All other systems reviewed and are negative.     Allergies  Adhesive  Home Medications   Prior to Admission medications   Medication Sig Start Date End Date Taking? Authorizing Provider  acetaminophen (TYLENOL) 500 MG tablet Take 1,000 mg by mouth every 6 (six) hours as needed for pain.    Historical Provider, MD  calcium-vitamin D (OSCAL WITH D) 500-200 MG-UNIT per tablet Take 1 tablet by mouth 2 (two) times daily.    Historical Provider, MD  Multiple Vitamins-Minerals (MULTIVITAMIN WITH MINERALS) tablet  Take 1 tablet by mouth daily.     Historical Provider, MD  Multiple Vitamins-Minerals (PRESERVISION AREDS 2) CAPS Take 1 tablet by mouth 2 (two) times daily.    Historical Provider, MD  ondansetron (ZOFRAN-ODT) 8 MG disintegrating tablet Take 8 mg by mouth once.     Historical Provider, MD  Wheat Dextrin (BENEFIBER DRINK MIX PO) Take 10 mLs by mouth daily.    Historical Provider, MD   BP 155/84  Pulse 75  Temp(Src) 98.7 F (37.1 C) (Oral)  Resp 18  SpO2 100% Physical Exam  Nursing note and vitals reviewed. Constitutional: She appears well-developed and well-nourished.  HENT:  Head: Normocephalic and atraumatic.  Mouth/Throat: Oropharynx is clear and moist.  Cardiovascular: Normal rate, regular rhythm and normal heart sounds.   Pulmonary/Chest:  Effort normal and breath sounds normal.  Abdominal: She exhibits no mass. There is no rebound and no guarding.  Mild distension of the abdomen Diffuse tenderness to palpation  Musculoskeletal: Normal range of motion.  Neurological: She is alert.  Skin: Skin is warm and dry.  Psychiatric: She has a normal mood and affect.    ED Course  Procedures (including critical care time) Labs Review Labs Reviewed - No data to display  Imaging Review Ct Abdomen Pelvis W Contrast  05/02/2014   CLINICAL DATA:  68 year old female with abdominal pelvic pain. History of bowel obstructions.  EXAM: CT ABDOMEN AND PELVIS WITH CONTRAST  TECHNIQUE: Multidetector CT imaging of the abdomen and pelvis was performed using the standard protocol following bolus administration of intravenous contrast.  CONTRAST:  100mL OMNIPAQUE IOHEXOL 300 MG/ML  SOLN  COMPARISON:  03/29/2014 and prior CTs  FINDINGS: Dilated small bowel loops are identified with a transition point in the low anterior pelvis region. No discrete obstructing cause is identified and likely related to an adhesion.  A small amount of free pelvic fluid is identified. There is no evidence of pneumoperitoneum or focal abscess.  Surgical changes within the anterior abdominal and pelvic wall noted.  Mild cardiomegaly noted.  The liver, spleen, pancreas, kidneys and adrenal glands are unremarkable.  There is no evidence of biliary dilatation, abdominal aortic aneurysm or enlarged lymph nodes.  No acute or suspicious bony abnormalities are noted.  IMPRESSION: High-grade small bowel obstruction with transition point in the low anterior pelvis, likely related to an adhesion. Small amount of free pelvic fluid without pneumoperitoneum or abscess.   Electronically Signed   By: Laveda AbbeJeff  Hu M.D.   On: 05/02/2014 09:38     EKG Interpretation None     10:07 AM Discussed with Dr. Carolynne Edouardoth with General Surgery.  He reports that he will admit the patient.  MDM   Final diagnoses:   None   Patient with a history of multiple abdominal surgeries and SBO presents today with diffuse abdominal pain.  Labs today unremarkable, aside from mild leukocytosis.  CT showing High-grade SBO with transition point.  Dr. Carolynne Edouardoth with General Surgery admitted patient for further management.    Santiago GladHeather Zyana Amaro, PA-C 05/02/14 1202

## 2014-05-02 NOTE — ED Notes (Signed)
PA at bedside.

## 2014-05-02 NOTE — ED Notes (Signed)
Pt ambulated to restroom with steady gait.

## 2014-05-02 NOTE — ED Provider Notes (Signed)
Medical screening examination/treatment/procedure(s) were performed by non-physician practitioner and as supervising physician I was immediately available for consultation/collaboration.   EKG Interpretation None       Ethelda Chick, MD 05/02/14 1227

## 2014-05-02 NOTE — ED Notes (Signed)
Pt c.o abd pain that started last night and has gotten worse States she has hx of bowel obstruction and this feels the same. Last BM 0100 this morning, was very small per pt

## 2014-05-02 NOTE — ED Notes (Signed)
Attempted to give report to floor, floor RN to call back.

## 2014-05-02 NOTE — ED Notes (Signed)
Surgeon at the bedside.

## 2014-05-02 NOTE — ED Notes (Signed)
Pt aware of the need for a urine sample. 

## 2014-05-03 ENCOUNTER — Inpatient Hospital Stay (HOSPITAL_COMMUNITY): Payer: Medicare Other

## 2014-05-03 DIAGNOSIS — R109 Unspecified abdominal pain: Secondary | ICD-10-CM | POA: Diagnosis not present

## 2014-05-03 DIAGNOSIS — K56609 Unspecified intestinal obstruction, unspecified as to partial versus complete obstruction: Secondary | ICD-10-CM | POA: Diagnosis not present

## 2014-05-03 LAB — CBC
HEMATOCRIT: 32.7 % — AB (ref 36.0–46.0)
HEMOGLOBIN: 11 g/dL — AB (ref 12.0–15.0)
MCH: 31.8 pg (ref 26.0–34.0)
MCHC: 33.6 g/dL (ref 30.0–36.0)
MCV: 94.5 fL (ref 78.0–100.0)
Platelets: 170 10*3/uL (ref 150–400)
RBC: 3.46 MIL/uL — ABNORMAL LOW (ref 3.87–5.11)
RDW: 14.1 % (ref 11.5–15.5)
WBC: 3.9 10*3/uL — AB (ref 4.0–10.5)

## 2014-05-03 LAB — BASIC METABOLIC PANEL
BUN: 9 mg/dL (ref 6–23)
CHLORIDE: 107 meq/L (ref 96–112)
CO2: 27 mEq/L (ref 19–32)
Calcium: 8.5 mg/dL (ref 8.4–10.5)
Creatinine, Ser: 0.63 mg/dL (ref 0.50–1.10)
GFR calc Af Amer: >90 mL/min (ref >90)
Glucose, Bld: 100 mg/dL — ABNORMAL HIGH (ref 70–99)
Potassium: 4.2 mEq/L (ref 3.7–5.3)
SODIUM: 141 meq/L (ref 137–147)

## 2014-05-03 NOTE — Progress Notes (Signed)
General Surgery Note  LOS: 1 day  POD -    PCP - Shirlean Mylar, [place]  Assessment/Plan: 1.  SBO  Last ventral hernia repair done by Dr. Donell Beers with [facility] in 08/2010 and by Dr. Michaell Cowing with Parietex mesh - 11/2010  WBC - 3,900 - 05/03/2014  KUB today shows contrast in right colon  To start clear liquids and see how she does.  She was hospitalized 03/29/2014 - 03/30/2014 on medicine service for SBO.  We had a discussion about when and if surgery is necessary.  For now she is getting better.  2.  Fibromyalgia 3. DVT prophylaxis - SQ Heparin   Active Problems:   SBO (small bowel obstruction)  Subjective:  Passing flatus and had BM.  Much less abdominal pain.  Objective:   Filed Vitals:   05/03/14 0620  BP: 128/69  Pulse: 74  Temp: 98.8 F (37.1 C)  Resp: 20     Intake/Output from previous day:  05/24 0701 - 05/25 0700 In: 1556.7 [I.V.:1556.7] Out: 1 [Stool:1]  Intake/Output this shift:      Physical Exam:   General: WN older WF who is alert and oriented.    HEENT: Normal. Pupils equal. .   Lungs: Clear   Abdomen: Good BS.  No localized tenderness or distention    Lab Results:    Recent Labs  05/02/14 0810 05/03/14 0433  WBC 12.5* 3.9*  HGB 14.3 11.0*  HCT 42.7 32.7*  PLT 225 170    BMET   Recent Labs  05/02/14 0810 05/03/14 0433  NA 137 141  K 4.4 4.2  CL 97 107  CO2 23 27  GLUCOSE 141* 100*  BUN 19 9  CREATININE 0.73 0.63  CALCIUM 10.0 8.5    PT/INR  No results found for this basename: LABPROT, INR,  in the last 72 hours  ABG  No results found for this basename: PHART, PCO2, PO2, HCO3,  in the last 72 hours   Studies/Results:  Ct Abdomen Pelvis W Contrast  05/02/2014   CLINICAL DATA:  68 year old female with abdominal pelvic pain. History of bowel obstructions.  EXAM: CT ABDOMEN AND PELVIS WITH CONTRAST  TECHNIQUE: Multidetector CT imaging of the abdomen and pelvis was performed using the standard protocol following bolus administration of  intravenous contrast.  CONTRAST:  OMNIPAQUE IOHEXOL 300 MG/ML  SOLN  COMPARISON:  03/29/2014 and prior CTs  FINDINGS: Dilated small bowel loops are identified with a transition point in the low anterior pelvis region. No discrete obstructing cause is identified and likely related to an adhesion.  A small amount of free pelvic fluid is identified. There is no evidence of pneumoperitoneum or focal abscess.  Surgical changes within the anterior abdominal and pelvic wall noted.  Mild cardiomegaly noted.  The liver, spleen, pancreas, kidneys and adrenal glands are unremarkable.  There is no evidence of biliary dilatation, abdominal aortic aneurysm or enlarged lymph nodes.  No acute or suspicious bony abnormalities are noted.  IMPRESSION: High-grade small bowel obstruction with transition point in the low anterior pelvis, likely related to an adhesion. Small amount of free pelvic fluid without pneumoperitoneum or abscess.   Electronically Signed   By: Laveda Abbe M.D.   On: 05/02/2014 09:38     Anti-infectives:   Anti-infectives   None      Ovidio Kin, MD, FACS Pager: [phone] Surgery Office: [phone] 05/03/2014

## 2014-05-04 DIAGNOSIS — K56609 Unspecified intestinal obstruction, unspecified as to partial versus complete obstruction: Secondary | ICD-10-CM | POA: Diagnosis not present

## 2014-05-04 MED ORDER — DOCUSATE SODIUM 100 MG PO CAPS: 100.0000 mg | ORAL_CAPSULE | Freq: Two times a day (BID) | ORAL | Status: DC

## 2014-05-04 NOTE — Discharge Summary (Signed)
Seen and agree  

## 2014-05-04 NOTE — Discharge Summary (Signed)
Physician Discharge Summary  Patient ID: Carol Roberts MRN: [id number] DOB/AGE: 1946-07-05 68 y.o.  Admit date: 05/02/2014 Discharge date: 05/04/2014  Admitting Diagnosis: SBO  Discharge Diagnosis Patient Active Problem List   Diagnosis Date Noted  . SBO (small bowel obstruction) 05/02/2014  . Partial small bowel obstruction 03/29/2014  . Small bowel obstruction 03/29/2014  . Hyperglycemia 03/29/2014  . Back pain, thoracic 08/17/2013  . Paraesophageal hiatal hernia - recurrent 05/25/2013    Consultants None  Imaging: Dg Abd Portable 2v  05/03/2014   CLINICAL DATA:  Abdominal pain, small bowel obstruction.  EXAM: PORTABLE ABDOMEN - 2 VIEW  COMPARISON:  CT scan of May 02, 2014; radiographs of March 30, 2014.  FINDINGS: Residual contrast is seen in the right and transverse colon. No significant small bowel dilatation is noted. Surgical clips are seen throughout the left side of the abdomen. There is no evidence of pneumoperitoneum.  IMPRESSION: Residual contrast is noted within the colon. No significant small bowel dilatation is seen.   Electronically Signed   By: Roque Lias M.D.   On: 05/03/2014 07:52    Procedures None   Hospital Course:  68 yo wf who has a history of multiple abdominal surgeries who presents with abdominal pain and nausea that started last night. She has had nausea but no vomiting. She has had 2 hiatal hernia repairs. She did have a bm with flatus at around 1am. She had an episode similar to this about a month ago that resolved without surgery. Her last ventral hernia repair with mesh was done in 2011  Workup showed SBO.  Patient was admitted and was transferred to the floor for conservative management of her SBO.  Luckily her symptoms improved quickly and thus her diet was advanced as tolerated.  On HD #3, the patient was voiding well, tolerating diet, ambulating well, pain well controlled, vital signs stable, and felt stable for discharge home.  Patient will  follow up in our office as needed and knows to call with questions or concerns.     Medication List         acetaminophen 500 MG tablet  Commonly known as:  TYLENOL  Take 1,000 mg by mouth every 6 (six) hours as needed for pain.     BENEFIBER DRINK MIX PO  Take 10 mLs by mouth 3 (three) times daily.     calcium-vitamin D 500-200 MG-UNIT per tablet  Commonly known as:  OSCAL WITH D  Take 1 tablet by mouth 2 (two) times daily.     dicyclomine 20 MG tablet  Commonly known as:  BENTYL  Take 20 mg by mouth 4 (four) times daily.     docusate sodium 100 MG capsule  Commonly known as:  COLACE  Take 1 capsule (100 mg total) by mouth 2 (two) times daily.     multivitamin with minerals tablet  Take 1 tablet by mouth daily.     PRESERVISION AREDS 2 Caps  Take 1 tablet by mouth 2 (two) times daily.     omega-3 acid ethyl esters 1 G capsule  Commonly known as:  LOVAZA  Take 1 g by mouth 2 (two) times daily.           Signed: Aris Georgia, [facility] Surgery [phone]  05/04/2014, 12:28 PM

## 2014-05-04 NOTE — Progress Notes (Signed)
  Subjective: She say her abdomen is sore, + BM, tolerating clears.  Objective: Vital signs in last 24 hours: Temp:  [98.3 F (36.8 C)-98.4 F (36.9 C)] 98.3 F (36.8 C) (05/26 0536) Pulse Rate:  [67-79] 67 (05/26 0536) Resp:  [16-18] 16 (05/26 0536) BP: (125-153)/(73-84) 152/77 mmHg (05/26 0536) SpO2:  [97 %-99 %] 98 % (05/26 0536) Last BM Date: 05/03/14 1820 PO 2 BM's Afebrile, VSS No labs Intake/Output from previous day: 05/25 0701 - 05/26 0700 In: 3700 [P.O.:1820; I.V.:1880] Out: -  Intake/Output this shift:    General appearance: alert, cooperative and no distress GI: BS, and BM  Lab Results:   Recent Labs  05/02/14 0810 05/03/14 0433  WBC 12.5* 3.9*  HGB 14.3 11.0*  HCT 42.7 32.7*  PLT 225 170    BMET  Recent Labs  05/02/14 0810 05/03/14 0433  NA 137 141  K 4.4 4.2  CL 97 107  CO2 23 27  GLUCOSE 141* 100*  BUN 19 9  CREATININE 0.73 0.63  CALCIUM 10.0 8.5   PT/INR No results found for this basename: LABPROT, INR,  in the last 72 hours   Recent Labs Lab 05/02/14 0810  AST 23  ALT 19  ALKPHOS 71  BILITOT 0.3  PROT 7.5  ALBUMIN 4.2     Lipase     Component Value Date/Time   LIPASE 15 05/02/2014 0810     Studies/Results: Dg Abd Portable 2v  05/03/2014   CLINICAL DATA:  Abdominal pain, small bowel obstruction.  EXAM: PORTABLE ABDOMEN - 2 VIEW  COMPARISON:  CT scan of May 02, 2014; radiographs of March 30, 2014.  FINDINGS: Residual contrast is seen in the right and transverse colon. No significant small bowel dilatation is noted. Surgical clips are seen throughout the left side of the abdomen. There is no evidence of pneumoperitoneum.  IMPRESSION: Residual contrast is noted within the colon. No significant small bowel dilatation is seen.   Electronically Signed   By: Roque Lias M.D.   On: 05/03/2014 07:52    Medications: . antiseptic oral rinse  15 mL Mouth Rinse BID  . heparin  5,000 Units Subcutaneous 3 times per day  .  pantoprazole (PROTONIX) IV  40 mg Intravenous QHS    Assessment/Plan    1.  Recurrent SBO   Last ventral hernia repair done by Dr. Donell Beers with [facility] in 08/2010 and by Dr. Michaell Cowing with  Parietex mesh - 11/2010, Left colectomy, 2010, with colostomy for perforated diverticulitis,  Appendectomy. Hiatal hernia 2005, and 2007.  To start clear liquids 05/03/14, and see how she does. She was hospitalized 03/29/2014 - 03/30/2014  on medicine service for SBO.  2. Fibromyalgia  3. DVT prophylaxis - SQ Heparin   Plan:  Full liquids, continue ambulation and then up to soft diet later this evening or AM  If she does well. She has been on Bentyl, should we discontinue this prior to d/c?  If she does well hopefully home in the AM.    LOS: 2 days    Sherrie George 05/04/2014

## 2014-05-04 NOTE — Progress Notes (Signed)
Seen and agree. She wants to try to go home this evening

## 2014-05-25 DIAGNOSIS — Z86010 Personal history of colonic polyps: Secondary | ICD-10-CM | POA: Diagnosis not present

## 2014-05-25 DIAGNOSIS — K589 Irritable bowel syndrome without diarrhea: Secondary | ICD-10-CM | POA: Diagnosis not present

## 2014-06-03 DIAGNOSIS — Z09 Encounter for follow-up examination after completed treatment for conditions other than malignant neoplasm: Secondary | ICD-10-CM | POA: Diagnosis not present

## 2014-06-03 DIAGNOSIS — Z86010 Personal history of colonic polyps: Secondary | ICD-10-CM | POA: Diagnosis not present

## 2014-06-03 DIAGNOSIS — K5730 Diverticulosis of large intestine without perforation or abscess without bleeding: Secondary | ICD-10-CM | POA: Diagnosis not present

## 2014-07-01 ENCOUNTER — Ambulatory Visit (INDEPENDENT_AMBULATORY_CARE_PROVIDER_SITE_OTHER): Payer: Medicare Other | Admitting: Ophthalmology

## 2014-07-02 ENCOUNTER — Ambulatory Visit (INDEPENDENT_AMBULATORY_CARE_PROVIDER_SITE_OTHER): Payer: Medicare Other | Admitting: Ophthalmology

## 2014-07-02 DIAGNOSIS — H33309 Unspecified retinal break, unspecified eye: Secondary | ICD-10-CM | POA: Diagnosis not present

## 2014-07-02 DIAGNOSIS — H43819 Vitreous degeneration, unspecified eye: Secondary | ICD-10-CM | POA: Diagnosis not present

## 2014-07-02 DIAGNOSIS — H2510 Age-related nuclear cataract, unspecified eye: Secondary | ICD-10-CM | POA: Diagnosis not present

## 2014-07-02 DIAGNOSIS — H3530 Unspecified macular degeneration: Secondary | ICD-10-CM | POA: Diagnosis not present

## 2014-07-08 IMAGING — RF DG ESOPHAGUS
6 series · 6 of 6 positions shown · non-contrast
Comparison: 05/08/2013

CLINICAL DATA: Paraesophageal hernia repair, Nissen fundoplication.

ESOPHAGUS/BARIUM SWALLOW/TABLET STUDY
Fluoroscopy Time: 10 seconds

[Series 2: run · 1 of 1 slices shown (1 of 6)]
[im 1/1]
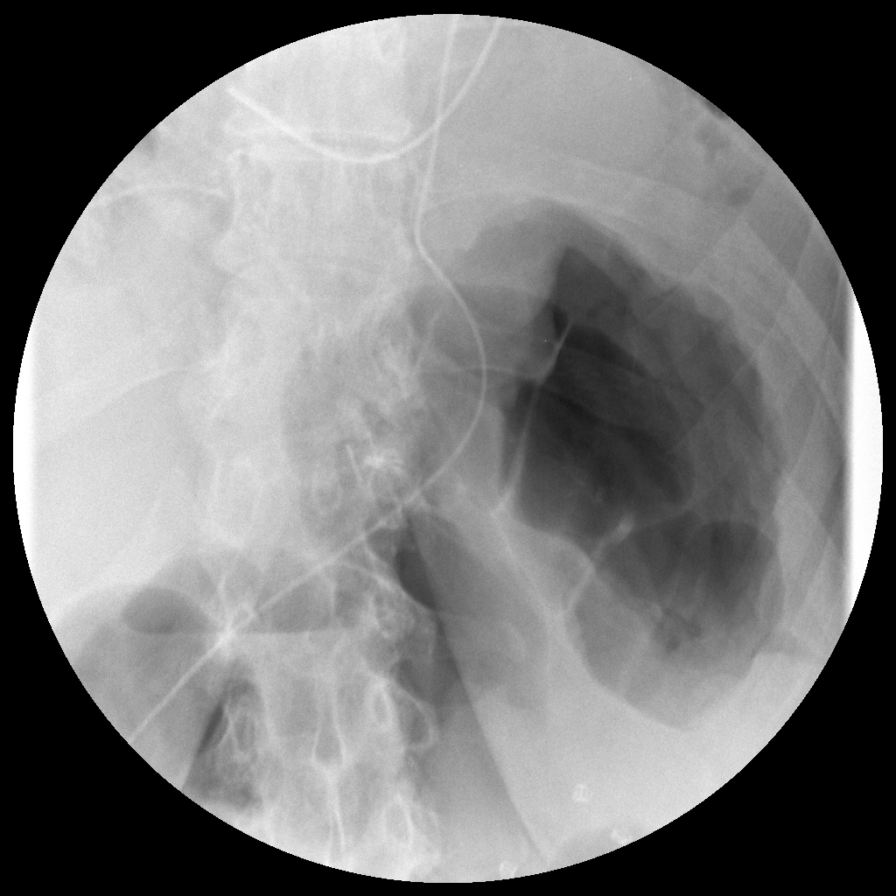

[Series 3: run · 1 of 1 slices shown (2 of 6)]
[im 1/1]
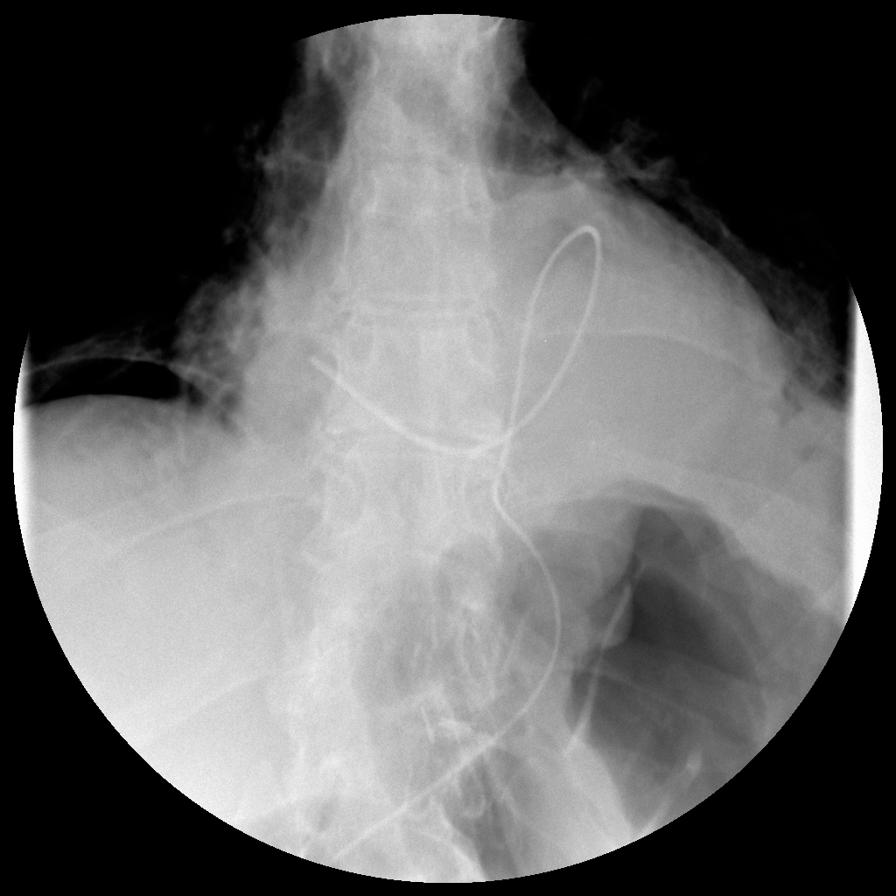

[Series 4: run · 1 of 1 slices shown (3 of 6)]
[im 1/1]
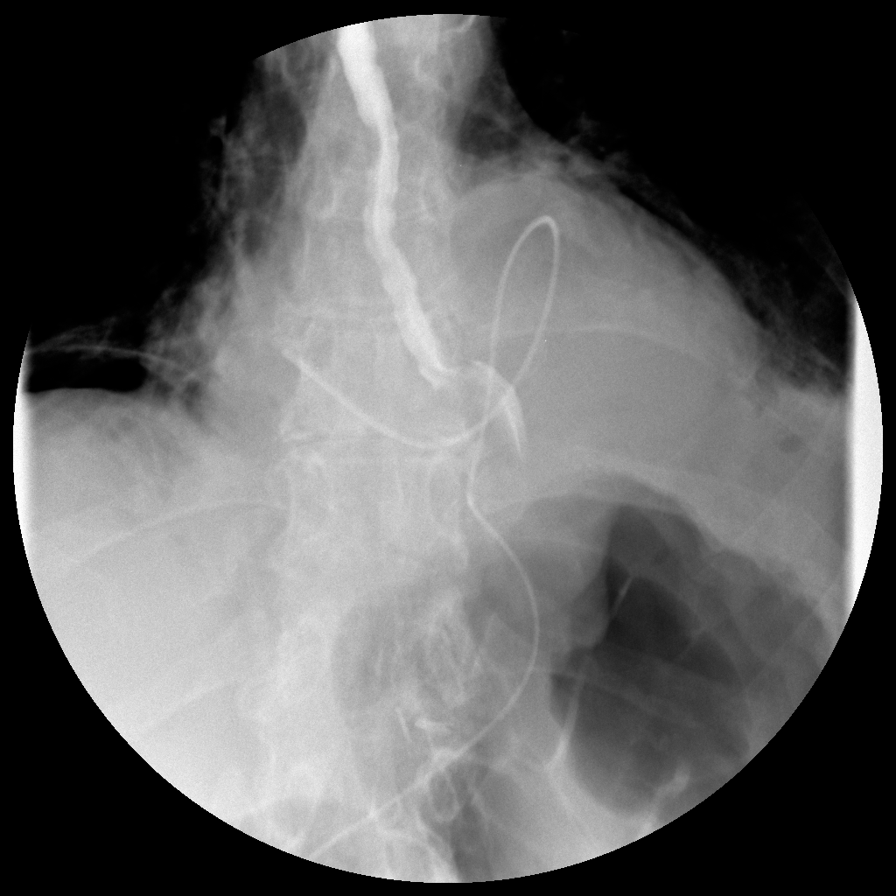

[Series 5: run · 1 of 1 slices shown (4 of 6)]
[im 1/1]
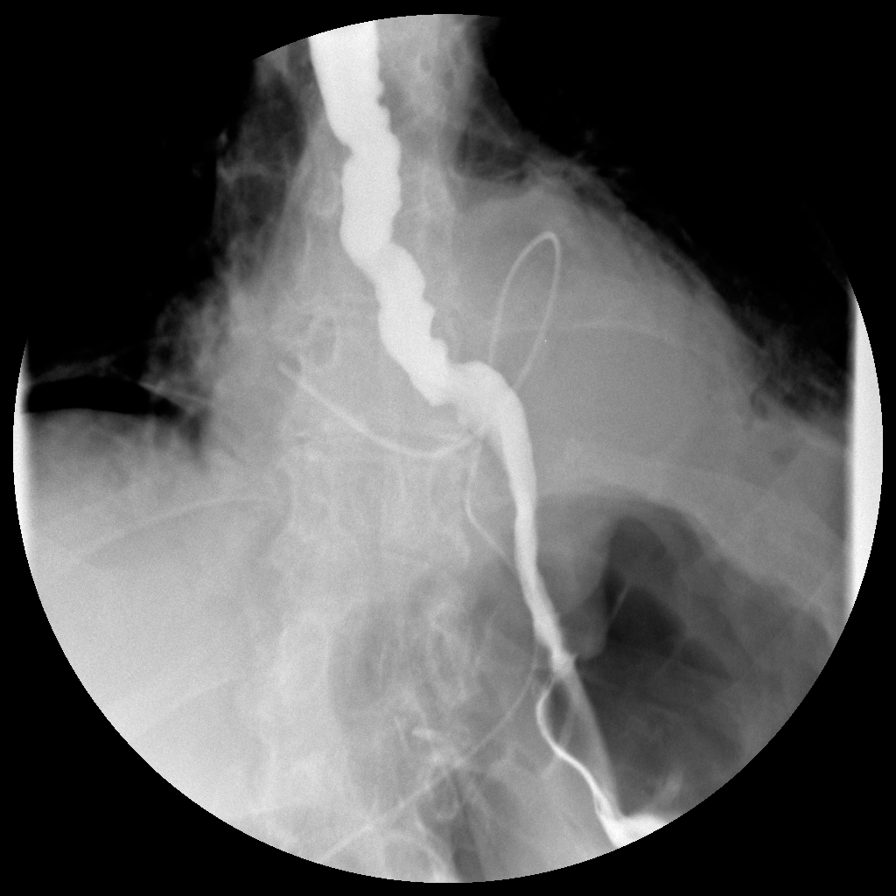

[Series 6: run · 1 of 1 slices shown (5 of 6)]
[im 1/1]
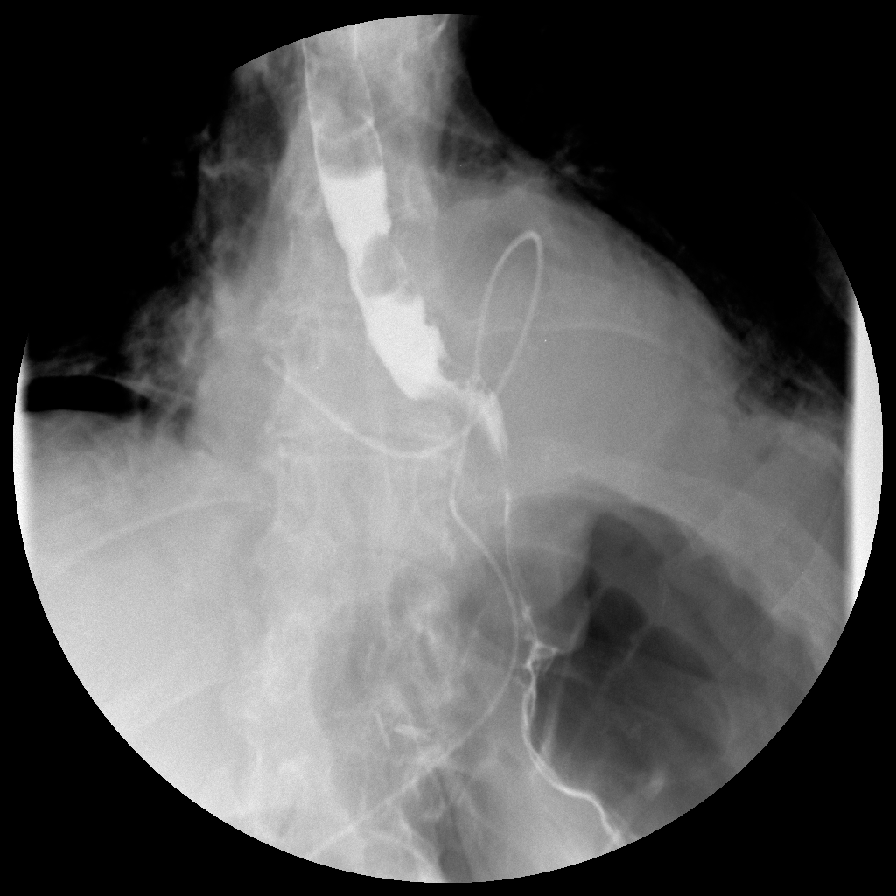

[Series 7: run · 1 of 1 slices shown (6 of 6)]
[im 1/1]
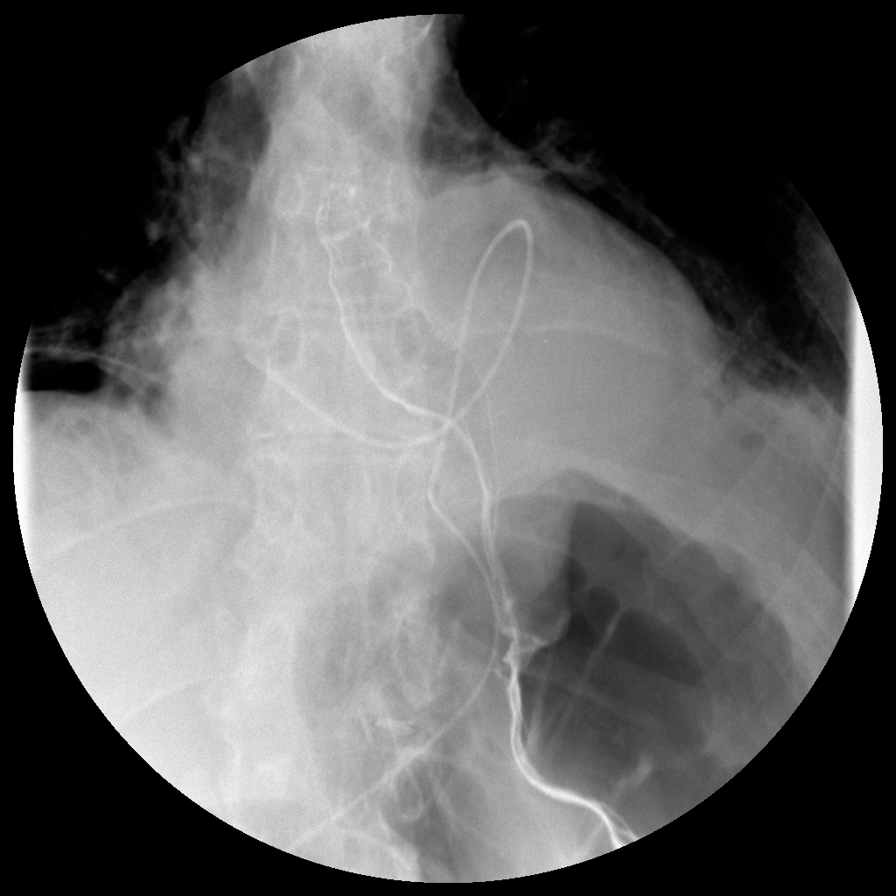

[6 of 6 positions shown; findings below may reference images not displayed]

FINDINGS: The scout image demonstrates a regional surgical drain.
After the patient ingested water-soluble contrast, there is normal
passage across the GE junction without significant obstruction into
the decompressed stomach.  Post images show no evidence of leak.
IMPRESSION: 1.  Patent Nissen fundoplication without evidence of leak.

## 2014-07-28 ENCOUNTER — Ambulatory Visit
Admission: RE | Admit: 2014-07-28 | Discharge: 2014-07-28 | Disposition: A | Discharge status: Home / Self Care | Payer: Medicare Other | Source: Ambulatory Visit | Attending: Family Medicine | Admitting: Family Medicine

## 2014-07-28 DIAGNOSIS — N6489 Other specified disorders of breast: Secondary | ICD-10-CM | POA: Diagnosis not present

## 2014-07-28 DIAGNOSIS — N63 Unspecified lump in unspecified breast: Secondary | ICD-10-CM

## 2014-08-19 DIAGNOSIS — K219 Gastro-esophageal reflux disease without esophagitis: Secondary | ICD-10-CM | POA: Diagnosis not present

## 2014-08-19 DIAGNOSIS — M899 Disorder of bone, unspecified: Secondary | ICD-10-CM | POA: Diagnosis not present

## 2014-08-19 DIAGNOSIS — K589 Irritable bowel syndrome without diarrhea: Secondary | ICD-10-CM | POA: Diagnosis not present

## 2014-08-19 DIAGNOSIS — Z23 Encounter for immunization: Secondary | ICD-10-CM | POA: Diagnosis not present

## 2014-08-19 DIAGNOSIS — M949 Disorder of cartilage, unspecified: Secondary | ICD-10-CM | POA: Diagnosis not present

## 2014-08-19 DIAGNOSIS — F411 Generalized anxiety disorder: Secondary | ICD-10-CM | POA: Diagnosis not present

## 2014-08-19 DIAGNOSIS — E785 Hyperlipidemia, unspecified: Secondary | ICD-10-CM | POA: Diagnosis not present

## 2014-08-19 DIAGNOSIS — R928 Other abnormal and inconclusive findings on diagnostic imaging of breast: Secondary | ICD-10-CM | POA: Diagnosis not present

## 2015-01-07 ENCOUNTER — Emergency Department (HOSPITAL_COMMUNITY): Payer: PPO

## 2015-01-07 ENCOUNTER — Emergency Department (HOSPITAL_COMMUNITY)
Admission: EM | Admit: 2015-01-07 | Discharge: 2015-01-07 | Disposition: A | Discharge status: Home / Self Care | Payer: PPO | Attending: Emergency Medicine | Admitting: Emergency Medicine

## 2015-01-07 ENCOUNTER — Encounter (HOSPITAL_COMMUNITY): Payer: Self-pay | Admitting: Emergency Medicine

## 2015-01-07 DIAGNOSIS — Y998 Other external cause status: Secondary | ICD-10-CM | POA: Diagnosis not present

## 2015-01-07 DIAGNOSIS — Z862 Personal history of diseases of the blood and blood-forming organs and certain disorders involving the immune mechanism: Secondary | ICD-10-CM | POA: Diagnosis not present

## 2015-01-07 DIAGNOSIS — Z79899 Other long term (current) drug therapy: Secondary | ICD-10-CM | POA: Diagnosis not present

## 2015-01-07 DIAGNOSIS — Y9289 Other specified places as the place of occurrence of the external cause: Secondary | ICD-10-CM | POA: Diagnosis not present

## 2015-01-07 DIAGNOSIS — Y9389 Activity, other specified: Secondary | ICD-10-CM | POA: Diagnosis not present

## 2015-01-07 DIAGNOSIS — S20211A Contusion of right front wall of thorax, initial encounter: Secondary | ICD-10-CM | POA: Diagnosis not present

## 2015-01-07 DIAGNOSIS — Z8719 Personal history of other diseases of the digestive system: Secondary | ICD-10-CM | POA: Insufficient documentation

## 2015-01-07 DIAGNOSIS — S46911A Strain of unspecified muscle, fascia and tendon at shoulder and upper arm level, right arm, initial encounter: Secondary | ICD-10-CM | POA: Insufficient documentation

## 2015-01-07 DIAGNOSIS — S46811A Strain of other muscles, fascia and tendons at shoulder and upper arm level, right arm, initial encounter: Secondary | ICD-10-CM

## 2015-01-07 DIAGNOSIS — S299XXA Unspecified injury of thorax, initial encounter: Secondary | ICD-10-CM | POA: Diagnosis present

## 2015-01-07 DIAGNOSIS — M1990 Unspecified osteoarthritis, unspecified site: Secondary | ICD-10-CM | POA: Insufficient documentation

## 2015-01-07 MED ORDER — HYDROCODONE-ACETAMINOPHEN 5-325 MG PO TABS: 1.0000 | ORAL_TABLET | Freq: Four times a day (QID) | ORAL | Status: DC | PRN

## 2015-01-07 MED ORDER — HYDROCODONE-ACETAMINOPHEN 5-325 MG PO TABS
1.0000 | ORAL_TABLET | Freq: Once | ORAL | Status: AC
  Administered 2015-01-07: 1 via ORAL
  Filled 2015-01-07: qty 1
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
## 2015-02-07 ENCOUNTER — Other Ambulatory Visit: Payer: Self-pay | Admitting: Family Medicine

## 2015-02-07 DIAGNOSIS — R922 Inconclusive mammogram: Secondary | ICD-10-CM

## 2015-02-21 ENCOUNTER — Ambulatory Visit
Admission: RE | Admit: 2015-02-21 | Discharge: 2015-02-21 | Disposition: A | Discharge status: Home / Self Care | Payer: PPO | Source: Ambulatory Visit | Attending: Family Medicine | Admitting: Family Medicine

## 2015-02-21 DIAGNOSIS — R922 Inconclusive mammogram: Secondary | ICD-10-CM

## 2015-06-06 ENCOUNTER — Other Ambulatory Visit: Payer: Self-pay

## 2016-02-02 DIAGNOSIS — L700 Acne vulgaris: Secondary | ICD-10-CM | POA: Diagnosis not present

## 2016-02-07 ENCOUNTER — Other Ambulatory Visit: Payer: Self-pay

## 2016-02-07 DIAGNOSIS — Z1231 Encounter for screening mammogram for malignant neoplasm of breast: Secondary | ICD-10-CM

## 2016-02-07 DIAGNOSIS — Z0000 Encounter for general adult medical examination without abnormal findings: Secondary | ICD-10-CM | POA: Diagnosis not present

## 2016-02-07 DIAGNOSIS — Z1389 Encounter for screening for other disorder: Secondary | ICD-10-CM | POA: Diagnosis not present

## 2016-02-07 DIAGNOSIS — E785 Hyperlipidemia, unspecified: Secondary | ICD-10-CM | POA: Diagnosis not present

## 2016-02-23 DIAGNOSIS — H9313 Tinnitus, bilateral: Secondary | ICD-10-CM | POA: Diagnosis not present

## 2016-02-23 DIAGNOSIS — H903 Sensorineural hearing loss, bilateral: Secondary | ICD-10-CM | POA: Diagnosis not present

## 2016-02-29 ENCOUNTER — Ambulatory Visit
Admission: RE | Admit: 2016-02-29 | Discharge: 2016-02-29 | Disposition: A | Discharge status: Home / Self Care | Payer: PPO | Source: Ambulatory Visit

## 2016-02-29 DIAGNOSIS — Z1231 Encounter for screening mammogram for malignant neoplasm of breast: Secondary | ICD-10-CM

## 2016-03-28 DIAGNOSIS — M21611 Bunion of right foot: Secondary | ICD-10-CM | POA: Diagnosis not present

## 2016-03-28 DIAGNOSIS — M2041 Other hammer toe(s) (acquired), right foot: Secondary | ICD-10-CM | POA: Diagnosis not present

## 2016-04-25 ENCOUNTER — Other Ambulatory Visit: Payer: Self-pay | Admitting: Orthopedic Surgery

## 2016-05-11 DIAGNOSIS — M21611 Bunion of right foot: Secondary | ICD-10-CM | POA: Diagnosis not present

## 2016-06-06 ENCOUNTER — Encounter (HOSPITAL_BASED_OUTPATIENT_CLINIC_OR_DEPARTMENT_OTHER): Payer: Self-pay | Admitting: *Deleted

## 2016-06-14 ENCOUNTER — Ambulatory Visit (HOSPITAL_BASED_OUTPATIENT_CLINIC_OR_DEPARTMENT_OTHER): Payer: PPO | Admitting: Certified Registered"

## 2016-06-14 ENCOUNTER — Ambulatory Visit (HOSPITAL_BASED_OUTPATIENT_CLINIC_OR_DEPARTMENT_OTHER)
Admission: RE | Admit: 2016-06-14 | Discharge: 2016-06-14 | Disposition: A | Discharge status: Home / Self Care | Payer: PPO | Source: Ambulatory Visit | Attending: Orthopedic Surgery | Admitting: Orthopedic Surgery

## 2016-06-14 ENCOUNTER — Encounter (HOSPITAL_BASED_OUTPATIENT_CLINIC_OR_DEPARTMENT_OTHER): Payer: Self-pay | Admitting: Certified Registered"

## 2016-06-14 ENCOUNTER — Encounter (HOSPITAL_BASED_OUTPATIENT_CLINIC_OR_DEPARTMENT_OTHER)
Admission: RE | Disposition: A | Discharge status: Home / Self Care | Payer: Self-pay | Source: Ambulatory Visit | Attending: Orthopedic Surgery

## 2016-06-14 DIAGNOSIS — M2041 Other hammer toe(s) (acquired), right foot: Secondary | ICD-10-CM | POA: Diagnosis not present

## 2016-06-14 DIAGNOSIS — M797 Fibromyalgia: Secondary | ICD-10-CM | POA: Insufficient documentation

## 2016-06-14 DIAGNOSIS — M7741 Metatarsalgia, right foot: Secondary | ICD-10-CM | POA: Diagnosis not present

## 2016-06-14 DIAGNOSIS — Z79899 Other long term (current) drug therapy: Secondary | ICD-10-CM | POA: Insufficient documentation

## 2016-06-14 DIAGNOSIS — M21611 Bunion of right foot: Secondary | ICD-10-CM | POA: Insufficient documentation

## 2016-06-14 DIAGNOSIS — G8918 Other acute postprocedural pain: Secondary | ICD-10-CM | POA: Diagnosis not present

## 2016-06-14 DIAGNOSIS — M21621 Bunionette of right foot: Secondary | ICD-10-CM | POA: Diagnosis not present

## 2016-06-14 DIAGNOSIS — M79671 Pain in right foot: Secondary | ICD-10-CM | POA: Diagnosis not present

## 2016-06-14 HISTORY — PX: BUNIONECTOMY: SHX129

## 2016-06-14 HISTORY — PX: HAMMER TOE SURGERY: SHX385

## 2016-06-14 HISTORY — PX: TENDON TRANSFER: SHX6109

## 2016-06-14 HISTORY — PX: WEIL OSTEOTOMY: SHX5044

## 2016-06-14 SURGERY — BUNIONECTOMY
Anesthesia: Regional | Site: Foot | Laterality: Right

## 2016-06-14 MED ORDER — MEPERIDINE HCL 25 MG/ML IJ SOLN: 6.2500 mg | INTRAMUSCULAR | Status: DC | PRN

## 2016-06-14 MED ORDER — MIDAZOLAM HCL 2 MG/2ML IJ SOLN
INTRAMUSCULAR | Status: AC
  Filled 2016-06-14: qty 2

## 2016-06-14 MED ORDER — ONDANSETRON HCL 4 MG/2ML IJ SOLN
INTRAMUSCULAR | Status: DC | PRN
  Administered 2016-06-14: 4 mg via INTRAVENOUS

## 2016-06-14 MED ORDER — BUPIVACAINE-EPINEPHRINE (PF) 0.5% -1:200000 IJ SOLN
INTRAMUSCULAR | Status: AC
  Filled 2016-06-14: qty 30

## 2016-06-14 MED ORDER — METOCLOPRAMIDE HCL 5 MG/ML IJ SOLN: 10.0000 mg | Freq: Once | INTRAMUSCULAR | Status: DC | PRN

## 2016-06-14 MED ORDER — BUPIVACAINE HCL (PF) 0.25 % IJ SOLN
INTRAMUSCULAR | Status: AC
  Filled 2016-06-14: qty 30

## 2016-06-14 MED ORDER — HYDROCODONE-ACETAMINOPHEN 7.5-325 MG PO TABS
1.0000 | ORAL_TABLET | Freq: Once | ORAL | Status: DC | PRN
Start: 2016-06-14 — End: 2016-06-14

## 2016-06-14 MED ORDER — LACTATED RINGERS IV SOLN
INTRAVENOUS | Status: DC
  Administered 2016-06-14: 10 mL/h via INTRAVENOUS
  Administered 2016-06-14 (×3): via INTRAVENOUS

## 2016-06-14 MED ORDER — PROPOFOL 10 MG/ML IV BOLUS
INTRAVENOUS | Status: AC
  Filled 2016-06-14: qty 20

## 2016-06-14 MED ORDER — SCOPOLAMINE 1 MG/3DAYS TD PT72: 1.0000 | MEDICATED_PATCH | Freq: Once | TRANSDERMAL | Status: DC | PRN

## 2016-06-14 MED ORDER — LIDOCAINE 2% (20 MG/ML) 5 ML SYRINGE
INTRAMUSCULAR | Status: AC
  Filled 2016-06-14: qty 5

## 2016-06-14 MED ORDER — PROPOFOL 10 MG/ML IV BOLUS
INTRAVENOUS | Status: DC | PRN
  Administered 2016-06-14: 140 mg via INTRAVENOUS

## 2016-06-14 MED ORDER — ONDANSETRON HCL 4 MG/2ML IJ SOLN
INTRAMUSCULAR | Status: AC
  Filled 2016-06-14: qty 2

## 2016-06-14 MED ORDER — SODIUM CHLORIDE 0.9 % IV SOLN: INTRAVENOUS | Status: DC

## 2016-06-14 MED ORDER — DEXAMETHASONE SODIUM PHOSPHATE 10 MG/ML IJ SOLN
INTRAMUSCULAR | Status: AC
  Filled 2016-06-14: qty 1

## 2016-06-14 MED ORDER — 0.9 % SODIUM CHLORIDE (POUR BTL) OPTIME
TOPICAL | Status: DC | PRN
  Administered 2016-06-14: 300 mL

## 2016-06-14 MED ORDER — CEFAZOLIN SODIUM-DEXTROSE 2-4 GM/100ML-% IV SOLN
2.0000 g | INTRAVENOUS | Status: AC
  Administered 2016-06-14: 2 g via INTRAVENOUS

## 2016-06-14 MED ORDER — DEXAMETHASONE SODIUM PHOSPHATE 10 MG/ML IJ SOLN
INTRAMUSCULAR | Status: DC | PRN
  Administered 2016-06-14: 4 mg via INTRAVENOUS

## 2016-06-14 MED ORDER — BUPIVACAINE-EPINEPHRINE (PF) 0.5% -1:200000 IJ SOLN
INTRAMUSCULAR | Status: DC | PRN
  Administered 2016-06-14: 30 mL via PERINEURAL

## 2016-06-14 MED ORDER — SENNA 8.6 MG PO TABS: 2.0000 | ORAL_TABLET | Freq: Two times a day (BID) | ORAL | Status: DC

## 2016-06-14 MED ORDER — FENTANYL CITRATE (PF) 100 MCG/2ML IJ SOLN
INTRAMUSCULAR | Status: AC
  Filled 2016-06-14: qty 2

## 2016-06-14 MED ORDER — OXYCODONE HCL 5 MG PO TABS: 5.0000 mg | ORAL_TABLET | ORAL | Status: DC | PRN

## 2016-06-14 MED ORDER — PROPOFOL 500 MG/50ML IV EMUL
INTRAVENOUS | Status: AC
  Filled 2016-06-14: qty 50

## 2016-06-14 MED ORDER — CHLORHEXIDINE GLUCONATE 4 % EX LIQD: 60.0000 mL | Freq: Once | CUTANEOUS | Status: DC

## 2016-06-14 MED ORDER — DOCUSATE SODIUM 100 MG PO CAPS: 100.0000 mg | ORAL_CAPSULE | Freq: Two times a day (BID) | ORAL | Status: DC

## 2016-06-14 MED ORDER — MIDAZOLAM HCL 2 MG/2ML IJ SOLN
1.0000 mg | INTRAMUSCULAR | Status: DC | PRN
  Administered 2016-06-14: 2 mg via INTRAVENOUS

## 2016-06-14 MED ORDER — GLYCOPYRROLATE 0.2 MG/ML IJ SOLN: 0.2000 mg | Freq: Once | INTRAMUSCULAR | Status: DC | PRN

## 2016-06-14 MED ORDER — CEFAZOLIN SODIUM-DEXTROSE 2-4 GM/100ML-% IV SOLN
INTRAVENOUS | Status: AC
  Filled 2016-06-14: qty 100

## 2016-06-14 MED ORDER — FENTANYL CITRATE (PF) 100 MCG/2ML IJ SOLN
50.0000 ug | INTRAMUSCULAR | Status: DC | PRN
  Administered 2016-06-14: 50 ug via INTRAVENOUS

## 2016-06-14 MED ORDER — FENTANYL CITRATE (PF) 100 MCG/2ML IJ SOLN: 25.0000 ug | INTRAMUSCULAR | Status: DC | PRN

## 2016-06-14 MED ORDER — LIDOCAINE HCL (CARDIAC) 20 MG/ML IV SOLN
INTRAVENOUS | Status: DC | PRN
  Administered 2016-06-14: 30 mg via INTRAVENOUS

## 2016-06-14 MED ORDER — ASPIRIN EC 81 MG PO TBEC
81.0000 mg | DELAYED_RELEASE_TABLET | Freq: Two times a day (BID) | ORAL | Status: DC
Start: 2016-06-14 — End: 2017-10-03

## 2016-06-14 SURGICAL SUPPLY — 90 items
BANDAGE ESMARK 6X9 LF (GAUZE/BANDAGES/DRESSINGS) ×2 IMPLANT
BIT DRILL 2.4 AO COUPLING CANN (BIT) ×3 IMPLANT
BIT DRILL 3/32DIAX5INL DISPOSE (BIT) ×1 IMPLANT
BIT DRILL 3/32DX5IN DISP (BIT) ×2
BLADE AVERAGE 25MMX9MM (BLADE) ×1
BLADE AVERAGE 25X9 (BLADE) ×3 IMPLANT
BLADE LONG MED 25X9 (BLADE) ×2 IMPLANT
BLADE LONG MED 25X9MM (BLADE) ×1
BLADE MICRO SAGITTAL (BLADE) ×3 IMPLANT
BLADE MINI RND TIP GREEN BEAV (BLADE) IMPLANT
BLADE OSC/SAG .038X5.5 CUT EDG (BLADE) ×4 IMPLANT
BLADE SURG 15 STRL LF DISP TIS (BLADE) ×7 IMPLANT
BLADE SURG 15 STRL SS (BLADE) ×16
BNDG CMPR 9X4 STRL LF SNTH (GAUZE/BANDAGES/DRESSINGS)
BNDG CMPR 9X6 STRL LF SNTH (GAUZE/BANDAGES/DRESSINGS) ×2
BNDG COHESIVE 4X5 TAN STRL (GAUZE/BANDAGES/DRESSINGS) ×4 IMPLANT
BNDG COHESIVE 6X5 TAN STRL LF (GAUZE/BANDAGES/DRESSINGS) ×3 IMPLANT
BNDG CONFORM 2 STRL LF (GAUZE/BANDAGES/DRESSINGS) IMPLANT
BNDG CONFORM 3 STRL LF (GAUZE/BANDAGES/DRESSINGS) ×4 IMPLANT
BNDG ESMARK 4X9 LF (GAUZE/BANDAGES/DRESSINGS) IMPLANT
BNDG ESMARK 6X9 LF (GAUZE/BANDAGES/DRESSINGS) ×4
CAP PIN PROTECTOR ORTHO WHT (CAP) IMPLANT
CHLORAPREP W/TINT 26ML (MISCELLANEOUS) ×4 IMPLANT
COVER BACK TABLE 60X90IN (DRAPES) ×4 IMPLANT
CUFF TOURNIQUET SINGLE 24IN (TOURNIQUET CUFF) ×3 IMPLANT
CUFF TOURNIQUET SINGLE 34IN LL (TOURNIQUET CUFF) IMPLANT
DRAPE EXTREMITY T 121X128X90 (DRAPE) ×4 IMPLANT
DRAPE OEC MINIVIEW 54X84 (DRAPES) ×4 IMPLANT
DRAPE U-SHAPE 47X51 STRL (DRAPES) ×4 IMPLANT
DRILL BIT 3/32DIAX5INL DISPOSE (BIT) ×4
DRSG MEPITEL 4X7.2 (GAUZE/BANDAGES/DRESSINGS) ×4 IMPLANT
DRSG PAD ABDOMINAL 8X10 ST (GAUZE/BANDAGES/DRESSINGS) ×4 IMPLANT
ELECT REM PT RETURN 9FT ADLT (ELECTROSURGICAL) ×4
ELECTRODE REM PT RTRN 9FT ADLT (ELECTROSURGICAL) ×2 IMPLANT
GAUZE SPONGE 4X4 12PLY STRL (GAUZE/BANDAGES/DRESSINGS) ×4 IMPLANT
GLOVE BIO SURGEON STRL SZ8 (GLOVE) ×4 IMPLANT
GLOVE BIOGEL PI IND STRL 8 (GLOVE) ×4 IMPLANT
GLOVE BIOGEL PI INDICATOR 8 (GLOVE) ×4
GLOVE ECLIPSE 7.5 STRL STRAW (GLOVE) ×4 IMPLANT
GLOVE EXAM NITRILE MD LF STRL (GLOVE) IMPLANT
GOWN STRL REUS W/ TWL LRG LVL3 (GOWN DISPOSABLE) ×2 IMPLANT
GOWN STRL REUS W/ TWL XL LVL3 (GOWN DISPOSABLE) ×4 IMPLANT
GOWN STRL REUS W/TWL LRG LVL3 (GOWN DISPOSABLE) ×4
GOWN STRL REUS W/TWL XL LVL3 (GOWN DISPOSABLE) ×8
GUIDEWIRE .08 (WIRE) IMPLANT
GUIDEWIRE ORTHO 0.054X7 SS (WIRE) IMPLANT
K-WIRE .054X4 (WIRE) IMPLANT
K-WIRE COCR 0.9X95 (WIRE) ×4
K-WIRE DBL END .054 LG (WIRE) ×4 IMPLANT
KWIRE COCR 0.9X95 (WIRE) ×1 IMPLANT
NDL HYPO 25X1 1.5 SAFETY (NEEDLE) IMPLANT
NEEDLE HYPO 22GX1.5 SAFETY (NEEDLE) IMPLANT
NEEDLE HYPO 25X1 1.5 SAFETY (NEEDLE) IMPLANT
NS IRRIG 1000ML POUR BTL (IV SOLUTION) ×4 IMPLANT
PACK BASIN DAY SURGERY FS (CUSTOM PROCEDURE TRAY) ×4 IMPLANT
PAD CAST 4YDX4 CTTN HI CHSV (CAST SUPPLIES) ×2 IMPLANT
PADDING CAST ABS 4INX4YD NS (CAST SUPPLIES)
PADDING CAST ABS COTTON 4X4 ST (CAST SUPPLIES) IMPLANT
PADDING CAST COTTON 4X4 STRL (CAST SUPPLIES) ×4
PADDING CAST COTTON 6X4 STRL (CAST SUPPLIES) ×3 IMPLANT
PENCIL BUTTON HOLSTER BLD 10FT (ELECTRODE) ×4 IMPLANT
SANITIZER HAND PURELL 535ML FO (MISCELLANEOUS) ×4 IMPLANT
SCREW CANN PT 4X36 NS (Screw) IMPLANT
SCREW CANNULATED NS 4MMX36MM (Screw) ×4 IMPLANT
SCREW HCS TWIST-OFF 2.0X12MM (Screw) ×9 IMPLANT
SCREW LOW PROFILE 3.5X48MM (Screw) ×3 IMPLANT
SCREW MAX VP2.5X28 (Screw) ×3 IMPLANT
SCREW MAX VPC 2.5MMX26MM (Screw) ×3 IMPLANT
SHEET MEDIUM DRAPE 40X70 STRL (DRAPES) ×4 IMPLANT
SLEEVE SCD COMPRESS KNEE MED (MISCELLANEOUS) ×4 IMPLANT
SPLINT FAST PLASTER 5X30 (CAST SUPPLIES) ×40
SPLINT PLASTER CAST FAST 5X30 (CAST SUPPLIES) ×20 IMPLANT
SPONGE LAP 18X18 X RAY DECT (DISPOSABLE) ×4 IMPLANT
STOCKINETTE 6  STRL (DRAPES) ×2
STOCKINETTE 6 STRL (DRAPES) ×2 IMPLANT
SUCTION FRAZIER HANDLE 10FR (MISCELLANEOUS) ×2
SUCTION TUBE FRAZIER 10FR DISP (MISCELLANEOUS) ×2 IMPLANT
SUT ETHILON 3 0 PS 1 (SUTURE) ×10 IMPLANT
SUT MNCRL AB 3-0 PS2 18 (SUTURE) ×4 IMPLANT
SUT VIC AB 0 SH 27 (SUTURE) IMPLANT
SUT VIC AB 2-0 SH 27 (SUTURE) ×8
SUT VIC AB 2-0 SH 27XBRD (SUTURE) ×2 IMPLANT
SUT VICRYL 0 UR6 27IN ABS (SUTURE) IMPLANT
SYR BULB 3OZ (MISCELLANEOUS) ×4 IMPLANT
SYR CONTROL 10ML LL (SYRINGE) IMPLANT
TOWEL OR 17X24 6PK STRL BLUE (TOWEL DISPOSABLE) ×8 IMPLANT
TUBE CONNECTING 20'X1/4 (TUBING)
TUBE CONNECTING 20X1/4 (TUBING) IMPLANT
UNDERPAD 30X30 (UNDERPADS AND DIAPERS) ×4 IMPLANT
YANKAUER SUCT BULB TIP NO VENT (SUCTIONS) IMPLANT

## 2016-06-14 NOTE — Anesthesia Preprocedure Evaluation (Addendum)
Anesthesia Evaluation  Patient identified by MRN, date of birth, ID band Patient awake    Reviewed: Allergy & Precautions, NPO status , Patient's Chart, lab work & pertinent test results  Airway Mallampati: II  TM Distance: >3 FB Neck ROM: Full    Dental  (+) Partial Upper   Pulmonary neg pulmonary ROS,    Pulmonary exam normal breath sounds clear to auscultation       Cardiovascular negative cardio ROS   Rhythm:Regular Rate:Normal + Carotid Bruit High pitched Right carotid bruit   Neuro/Psych  Headaches,  Neuromuscular disease negative psych ROS   GI/Hepatic hiatal hernia, GERD  Medicated,  Endo/Other    Renal/GU   negative genitourinary   Musculoskeletal  (+) Arthritis , Osteoarthritis,  Fibromyalgia -Right great toe bunion Hammertoes right 2-4   Abdominal Normal abdominal exam  (+)   Peds  Hematology  (+) anemia ,   Anesthesia Other Findings   Reproductive/Obstetrics                           Anesthesia Physical Anesthesia Plan  ASA: II  Anesthesia Plan: General and Regional   Post-op Pain Management: GA combined w/ Regional for post-op pain   Induction: Intravenous  Airway Management Planned: LMA  Additional Equipment:   Intra-op Plan:   Post-operative Plan: Extubation in OR  Informed Consent: I have reviewed the patients History and Physical, chart, labs and discussed the procedure including the risks, benefits and alternatives for the proposed anesthesia with the patient or authorized representative who has indicated his/her understanding and acceptance.   Dental advisory given  Plan Discussed with: CRNA, Anesthesiologist and Surgeon  Anesthesia Plan Comments:         Anesthesia Quick Evaluation

## 2016-06-14 NOTE — Progress Notes (Signed)
Assisted Dr. Foster with right, ultrasound guided, popliteal/saphenous block. Side rails up, monitors on throughout procedure. See vital signs in flow sheet. Tolerated Procedure well. 

## 2016-06-14 NOTE — H&P (Signed)
Carol Roberts is an 70 y.o. female.   Chief Complaint: right forefoot pain HPI: [age] y/o female with painful right foot bunion, 2nd crossover toe and 2nd and 3rd hammertoes.  She has failed non op treatment and presents Roberts for surgical correction of the painful toes.  Per Carol. Malen Roberts she has a right side carotid bruit auscultated Roberts.  She is safe to proceed with surgery from anesthesia standpoint.  Past Medical History  Diagnosis Date  . GERD (gastroesophageal reflux disease)   . Paraesophageal hiatal hernia     causes vomiting, chest and left shoulder pain, sometimes left jaw pain  . Headache(784.0)     ocas migraines  . Anemia     "off and on"  . Fibromyalgia   . H/O hiatal hernia     paraesophageal hernia, had surgery to repair  . Arthritis     knees, hands, hips - hx of dislocated hip many yrs ago     Past Surgical History  Procedure Laterality Date  . Colostomy closure  2010  . Hiatal hernia repair  2005    [place]  . Hiatal hernia repair  2007    Lap with MTF bridge mesh.  Carol Roberts,   . Ventral hernia repair  09/06/2010  . Laparoscopic incisional / umbilical / ventral hernia repair  11/2010    lap VWH with mesh  . Left colectomy  2010    with colostomy for perforated diverticulitis  . Esophageal manometry N/A 06/08/2013    Procedure: ESOPHAGEAL MANOMETRY (EM);  Surgeon: Carol ShiverSalem F Ganem, MD;  Location: [facility];  Service: Endoscopy;  Laterality: N/A;  Carol Roberts will read Manometry for Carol. Evette Roberts  . 2011 right knee arthroscopy    . Hernia repair    . Appendectomy    . Colon surgery      bowel resection for diverticulitis    History reviewed. No pertinent family history. Social History:  reports that she has never smoked. She has never used smokeless tobacco. She reports that she drinks alcohol. She reports that she does not use illicit drugs.  Allergies:  Allergies  Allergen Reactions  . Adhesive [Tape] Rash    Medications Prior to Admission   Medication Sig Dispense Refill  . acetaminophen (TYLENOL) 500 MG tablet Take 1,000 mg by mouth every 6 (six) hours as needed for pain.    . calcium-vitamin D (OSCAL WITH D) 500-200 MG-UNIT per tablet Take 1 tablet by mouth 2 (two) times daily.    Marland Kitchen. dicyclomine (BENTYL) 20 MG tablet Take 20 mg by mouth 3 (three) times daily as needed for spasms.     . Multiple Vitamins-Minerals (MULTIVITAMIN WITH MINERALS) tablet Take 1 tablet by mouth daily.     . Multiple Vitamins-Minerals (PRESERVISION AREDS 2) CAPS Take 1 tablet by mouth 2 (two) times daily.    . polyethylene glycol (MIRALAX / GLYCOLAX) packet Take 17 g by mouth daily.    . traMADol (ULTRAM) 50 MG tablet Take 50 mg by mouth every 6 (six) hours as needed.    . docusate sodium (COLACE) 100 MG capsule Take 1 capsule (100 mg total) by mouth 2 (two) times daily. (Patient taking differently: Take 100 mg by mouth 2 (two) times daily as needed for mild constipation. ) 10 capsule 0    No results found for this or any previous visit (from the past 48 hour(s)). No results found.  ROS  No recent f/c/n/v/wt loss  Blood pressure 134/64, pulse 85, temperature 97.8  F (36.6 C), temperature source Oral, resp. rate 13, height 5\' 1"  (1.549 m), weight 72.576 kg (160 lb), SpO2 97 %. Physical Exam  wn wd woman in nad.  A and O x 4.  Mood and affect normal.  EOMI.  Resp unlabored.  R foot with bunion and 2-3 hammertoe deformities.  Skin healhy and intact.  No lymphadnopathy.  5/5 strength in PF and DF of the toes.  Assessment/Plan R bunion and 2-3 hammertoes and metatarsalgia.  To OR for surgical correction.  The risks and benefits of the alternative treatment options have been discussed in detail.  The patient wishes to proceed with surgery and specifically understands risks of bleeding, infection, nerve damage, blood clots, need for additional surgery, amputation and death.   Carol Roberts, Carol Dolce, MD 06/14/2016, 9:17 AM

## 2016-06-14 NOTE — Anesthesia Postprocedure Evaluation (Signed)
Anesthesia Post Note  Patient: Carol Roberts  Procedure(s) Performed: Procedure(s) (LRB): RIGHT LAPIDUS AND MODIFIED MCBRIDE BUNIONECTOMY (Right) TWO TO THREE METATARSAL WEIL OSTEOTOMY (Right) RIGHT TWO TO THREE HAMMER TOE CORRECTION (Right) FLEXOR TO EXTENSOR TRANSFER (Right)  Patient location during evaluation: PACU Anesthesia Type: General and Regional Level of consciousness: awake and alert and oriented Pain management: pain level controlled Vital Signs Assessment: post-procedure vital signs reviewed and stable Respiratory status: spontaneous breathing, nonlabored ventilation, respiratory function stable and patient connected to nasal cannula oxygen Cardiovascular status: blood pressure returned to baseline and stable Postop Assessment: no signs of nausea or vomiting Anesthetic complications: no Comments: No complaints of pain. Reiterated to patient need for further workup for her right carotid bruit.    Last Vitals:  Filed Vitals:   06/14/16 1138 06/14/16 1220  BP:  150/65  Pulse: 85 78  Temp:  36.6 C  Resp: 18 16    Last Pain:  Filed Vitals:   06/14/16 1240  PainSc: 0-No pain                 Amiliana Foutz A.

## 2016-06-14 NOTE — Brief Op Note (Signed)
06/14/2016  11:06 AM  PATIENT:  Carol Roberts  70 y.o. female  PRE-OPERATIVE DIAGNOSIS: 1.  Painful right bunion deformity      2.  Right 2nd hammertoe, crossover deformity and metatarsalgia      3.  Right 3rd hammertoe and metatarsalgia  POST-OPERATIVE DIAGNOSIS: same  Procedure(s): 1.  Right modified McBride bunionectomy 2.  Right Lapidus arthrodesis of the 1st TMT joint (separate incision) 3.  Right 2nd MT Weil osteotomy (separate incision) 4.  Right 3rd MT Weil osteotomy (separate incision) 5.  Right 2nd and 3rd MTPJ dorsal capsulotomy and extensor tendon lengthening 6.  Right 2nd and 3rd hammertoe corrections with flexor to extensor transfers (separate incisions) 7.  Right 2nd and 3rd toe open flexor tenotomies (separate incisions) 8.  3 view xrays of the right foot  SURGEON:  Toni ArthursJohn Samiha Denapoli, MD  ASSISTANT: Alfredo MartinezJustin Ollis, PA-C  ANESTHESIA:   General, regional  EBL:  minimal   TOURNIQUET:  approx 1:15 at 250 mm Hg  COMPLICATIONS:  None apparent  DISPOSITION:  Extubated, awake and stable to recovery.  DICTATION ID:  [id number]

## 2016-06-14 NOTE — Transfer of Care (Signed)
Immediate Anesthesia Transfer of Care Note  Patient: Carol Roberts  Procedure(s) Performed: Procedure(s) with comments: RIGHT LAPIDUS AND MODIFIED MCBRIDE BUNIONECTOMY (Right) - RIGHT LAPIDUS AND MODIFIED MCBRIDE BUNIONECTOMY TWO TO THREE METATARSAL WEIL OSTEOTOMY (Right) - TWO TO THREE METATARSAL WEIL OSTEOTOMY RIGHT TWO TO THREE HAMMER TOE CORRECTION (Right) - RIGHT TWO TO THREE HAMMER TOE CORRECTION FLEXOR TO EXTENSOR TRANSFER (Right) - FLEXOR TO EXTENSOR TRANSFER  Patient Location: PACU  Anesthesia Type:GA combined with regional for post-op pain  Level of Consciousness: awake and patient cooperative  Airway & Oxygen Therapy: Patient Spontanous Breathing and Patient connected to face mask oxygen  Post-op Assessment: Report given to RN and Post -op Vital signs reviewed and stable  Post vital signs: Reviewed and stable  Last Vitals:  Filed Vitals:   06/14/16 0855 06/14/16 0900  BP:  134/64  Pulse: 84 85  Temp:    Resp: 13 13    Last Pain: There were no vitals filed for this visit.    Patients Stated Pain Goal: 0 (06/14/16 0751)  Complications: No apparent anesthesia complications

## 2016-06-14 NOTE — Discharge Instructions (Addendum)
Toni ArthursJohn Hewitt, MD [facility]  Please read the following information regarding your care after surgery.  Medications  You only need a prescription for the narcotic pain medicine (ex. oxycodone, Percocet, Norco).  All of the other medicines listed below are available over the counter. X acetominophen (Tylenol) 650 mg every 4-6 hours as you need for minor pain X oxycodone as prescribed for moderate to severe pain ?   Narcotic pain medicine (ex. oxycodone, Percocet, Vicodin) will cause constipation.  To prevent this problem, take the following medicines while you are taking any pain medicine. X docusate sodium (Colace) 100 mg twice a day X senna (Senokot) 2 tablets twice a day  X To help prevent blood clots, take a baby aspirin (81 mg) twice a day after surgery until you are allowed to initiate weightbearing on your operative extremity.  You should also get up every hour while you are awake to move around.    Weight Bearing X Do not bear any weight on the operated leg or foot.  Cast / Splint / Dressing X Keep your splint or cast clean and dry.  Dont put anything (coat hanger, pencil, etc) down inside of it.  If it gets damp, use a hair dryer on the cool setting to dry it.  If it gets soaked, call the office to schedule an appointment for a cast change.  After your dressing, cast or splint is removed; you may shower, but do not soak or scrub the wound.  Allow the water to run over it, and then gently pat it dry.  Swelling It is normal for you to have swelling where you had surgery.  To reduce swelling and pain, keep your toes above your nose for at least 3 days after surgery.  It may be necessary to keep your foot or leg elevated for several weeks.  If it hurts, it should be elevated.  Follow Up Call my office at [phone] when you are discharged from the hospital or surgery center to schedule an appointment to be seen two weeks after surgery.  Call my office at [phone]  if you develop a fever >101.5 F, nausea, vomiting, bleeding from the surgical site or severe pain.     Regional Anesthesia Blocks  1. Numbness or the inability to move the "blocked" extremity may last from 3-48 hours after placement. The length of time depends on the medication injected and your individual response to the medication. If the numbness is not going away after 48 hours, call your surgeon.  2. The extremity that is blocked will need to be protected until the numbness is gone and the  Strength has returned. Because you cannot feel it, you will need to take extra care to avoid injury. Because it may be weak, you may have difficulty moving it or using it. You may not know what position it is in without looking at it while the block is in effect.  3. For blocks in the legs and feet, returning to weight bearing and walking needs to be done carefully. You will need to wait until the numbness is entirely gone and the strength has returned. You should be able to move your leg and foot normally before you try and bear weight or walk. You will need someone to be with you when you first try to ensure you do not fall and possibly risk injury.  4. Bruising and tenderness at the needle site are common side effects and will resolve in a few days.  5. Persistent numbness or new problems with movement should be communicated to the surgeon or the [facility] [phone])/ [facility] [phone]).     Post Anesthesia Home Care Instructions  Activity: Get plenty of rest for the remainder of the day. A responsible adult should stay with you for 24 hours following the procedure.  For the next 24 hours, DO NOT: -Drive a car -Advertising copywriterperate machinery -Drink alcoholic beverages -Take any medication unless instructed by your physician -Make any legal decisions or sign important papers.  Meals: Start with liquid foods such as gelatin or soup. Progress to regular foods as  tolerated. Avoid greasy, spicy, heavy foods. If nausea and/or vomiting occur, drink only clear liquids until the nausea and/or vomiting subsides. Call your physician if vomiting continues.  Special Instructions/Symptoms: Your throat may feel dry or sore from the anesthesia or the breathing tube placed in your throat during surgery. If this causes discomfort, gargle with warm salt water. The discomfort should disappear within 24 hours.  If you had a scopolamine patch placed behind your ear for the management of post- operative nausea and/or vomiting:  1. The medication in the patch is effective for 72 hours, after which it should be removed.  Wrap patch in a tissue and discard in the trash. Wash hands thoroughly with soap and water. 2. You may remove the patch earlier than 72 hours if you experience unpleasant side effects which may include dry mouth, dizziness or visual disturbances. 3. Avoid touching the patch. Wash your hands with soap and water after contact with the patch.

## 2016-06-14 NOTE — Anesthesia Procedure Notes (Addendum)
Anesthesia Regional Block:  Popliteal block  Pre-Anesthetic Checklist: ,, timeout performed, Correct Patient, Correct Site, Correct Laterality, Correct Procedure, Correct Position, site marked, Risks and benefits discussed,  Surgical consent,  Pre-op evaluation,  At surgeon's request and post-op pain management  Laterality: Right  Prep: chloraprep       Needles:  Injection technique: Single-shot  Needle Type: Echogenic Stimulator Needle     Needle Length: 9cm 9 cm Needle Gauge: 21 and 21 G  Needle insertion depth: 7 cm   Additional Needles:  Procedures: ultrasound guided (picture in chart) Popliteal block Narrative:  Start time: 06/14/2016 8:45 AM End time: 06/14/2016 8:51 AM Injection made incrementally with aspirations every 5 mL.  Performed by: Personally  Anesthesiologist: Mal AmabileFOSTER, MICHAEL  Additional Notes: Relevant anatomy ID'd with US. Incremental 5ml injection with frequent aspiration. 20ml total volume injected. Patient tolerated procedure well.    Anesthesia Regional Block:  Adductor canal block  Pre-Anesthetic Checklist: ,, timeout performed, Correct Patient, Correct Site, Correct Laterality, Correct Procedure, Correct Position, site marked, Risks and benefits discussed,  Surgical consent,  Pre-op evaluation,  At surgeon's request and post-op pain management  Laterality: Right  Prep: chloraprep       Needles:   Needle Type: Echogenic Stimulator Needle     Needle Length: 9cm 9 cm Needle Gauge: 21 and 21 G  Needle insertion depth: 6 cm   Additional Needles:  Procedures: ultrasound guided (picture in chart) Adductor canal block Narrative:  Start time: 06/14/2016 8:53 AM End time: 06/14/2016 8:58 AM  Performed by: Personally  Anesthesiologist: Mal AmabileFOSTER, MICHAEL  Additional Notes: Patient tolerated procedure well. 10ml injected   Procedure Name: LMA Insertion Date/Time: 06/14/2016 9:26 AM Performed by: Yovan Leeman D Pre-anesthesia Checklist:  Patient identified, Emergency Drugs available, Suction available and Patient being monitored Patient Re-evaluated:Patient Re-evaluated prior to inductionOxygen Delivery Method: Circle system utilized Preoxygenation: Pre-oxygenation with 100% oxygen Intubation Type: IV induction Ventilation: Mask ventilation without difficulty LMA: LMA inserted LMA Size: 4.0 Number of attempts: 1 Airway Equipment and Method: Bite block Placement Confirmation: positive ETCO2 Tube secured with: Tape Dental Injury: Teeth and Oropharynx as per pre-operative assessment

## 2016-06-15 NOTE — Op Note (Signed)
NAMMarland Kitchen:  Carol Roberts, Carol              ACCOUNT NO.:  [id number]  MEDICAL RECORD NO.:  [id number]  LOCATION:                                 FACILITY:  PHYSICIAN:  Toni ArthursJohn Rhina Kramme, MD             DATE OF BIRTH:  DATE OF PROCEDURE:  06/14/2016 DATE OF DISCHARGE:                              OPERATIVE REPORT   PREOPERATIVE DIAGNOSES: 1. Painful right forefoot bunion deformity. 2. Right second hammertoe, crossover toe, and metatarsalgia. 3. Right third hammertoe and metatarsalgia.  POSTOPERATIVE DIAGNOSIS: 1. Painful right forefoot bunion deformity. 2. Right second hammertoe, crossover toe, and metatarsalgia. 3. Right third hammertoe and metatarsalgia.  PROCEDURE: 1. Right modified McBride bunionectomy. 2. Right Lapidus arthrodesis of the first TMT joint through a separate     incision. 3. Right second metatarsal Weil osteotomy through a separate incision. 4. Right third metatarsal Weil osteotomy through a separate incision. 5. Right second and third MTP joint dorsal capsulotomies and extensor     tendon lengthenings. 6. Right second and third hammertoe corrections with a flexor to     extensor transfers through separate incisions. 7. Right second and third toe open flexor tenotomies through separate     incisions. 8. Three-view radiographs of the right foot.  SURGEON:  Toni ArthursJohn Yarielys Beed, MD.  ASSISTANT:  Alfredo MartinezJustin Ollis, PA-C.  ANESTHESIA:  General, regional.  ESTIMATED BLOOD LOSS:  Minimal.  TOURNIQUET TIME:  Approximately 1 hour 15 minutes at 250 mmHg.  COMPLICATIONS:  None apparent.  DISPOSITION:  Extubated, awake and stable to recovery.  INDICATION FOR PROCEDURE:  The patient is a 70 year old woman without significant past medical history.  She has been struggling with a painful right foot bunion deformity as well as second and third hammertoe deformities and considerable metatarsalgia.  She has failed nonoperative treatment to date including activity modification,  oral anti-inflammatories, and shoe wear modification.  She presents now for operative treatment of these painful conditions.  She understands the risks and benefits, the alternative treatment options, and elects surgical treatment.  She specifically understands risks of bleeding, infection, nerve damage, blood clots, need for additional surgery, continued pain, nonunion, recurrence of deformities, amputation, and death.  PROCEDURE IN DETAIL:  After preoperative consent was obtained and the correct operative site was identified, the patient was brought to the operating room and placed supine on the operating table.  General anesthesia was induced.  Preoperative antibiotics were administered. Surgical time-out was taken.  The right lower extremity was prepped and draped in standard sterile fashion with tourniquet around the thigh. The extremity was exsanguinated and tourniquet was inflated to 250 mmHg. A longitudinal incision was then made at the dorsum of the first webspace.  Sharp dissection was carried down through skin subcutaneous tissue.  The intermetatarsal ligament was divided under direct vision. The adductor hallucis was released from lateral sesamoid and an arthrotomy was made between the lateral sesamoid and the metatarsal head.  Small perforations were made in the lateral joint capsule.  This allowed passive correction of the bunion deformity to slightly varus position.  Attention was then turned to the medial eminence where a longitudinal incision was made.  Sharp dissection  was carried down through skin subcutaneous tissue.  The medial joint capsule was incised and elevated. The hypertrophic medial eminence was resected in line with the first metatarsal shaft.  Attention was then turned to the dorsum of the foot where a longitudinal incision was made over the first TMT joint.  The extensor tendons were protected and the joint capsule was opened.  The oscillating saw  was used to remove the remaining articular cartilage and subchondral bone from both sides of the joint.  More bone was taken laterally to allow correction of the intermetatarsal angle.  The wound was irrigated. Drill bit was used to perforate both sides of the joint leaving the resultant bone graft in place.  The joint was reduced and fixed with a 4- 0 cannulated compression screw and a 3.5 mm fully-threaded LPS screw from the [employer] set.  Attention was then turned to the second MTP joint where a dorsal capsulotomy and extensor tendon lengthenings were performed.  Head of the metatarsal was exposed and a Weil osteotomy was made with the oscillating saw.  The osteotomy was fixed with a two 2 mm Biomet FRS screws.  Overhanging bone was trimmed with a rongeur.  The same procedure was then performed for the third MTP joint again with dorsal capsulotomy, extensor tendon lengthening, and Weil osteotomy fixed with a 2 mm FRS screw.  A transverse incision was made over the PIP joint.  Sharp dissection was carried down through the skin and subcutaneous tissue of the second toe. The head of the proximal phalanx and the base of the middle phalanx were resected with the oscillating saw.  Joint was reduced and fixed with a 2.5 mm Biomet VPC screw.  The same procedure was then performed through a separate incision for the third toe again fixing the joint with a 2.5 mm VPC screw.  Oblique incisions were then made on the plantar aspect of the forefoot beneath the MTP joint.  Sharp dissection was carried down through skin subcutaneous tissue.  Flexor tendon sheath was incised and the flexor digitorum longus tendon was isolated.  It was then released from the distal phalanx and pulled out through the plantar incision.  It was tagged with Vicryl and passed subperiosteally along the lateral base of the proximal phalanx to the dorsal incision.  The same procedure was performed for the third toe through a  separate incision.  The toes were then reduced to a neutral position and the FDL tendon repaired to the extensor mechanism with horizontal mattress sutures of 2-0 Vicryl.  AP and lateral radiographs were obtained of the forefoot and of the midfoot.  These showed appropriate correction of the hallux valgus and metatarsus primus varus deformities as well as the hammertoe deformities.  The wounds were irrigated copiously.  The extensor tendons were repaired with Vicryl, the medial joint capsule was repaired with Vicryl, skin incisions were closed with Monocryl and nylon.  Sterile dressings were applied followed by a well-padded short-leg splint. Tourniquet was released after application of the dressings at 1 hour and 15 minutes.  The patient was awakened from anesthesia and transported to the recovery room in stable condition.  FOLLOWUP PLAN:  The patient will be nonweightbearing on the right lower extremity.  She will follow up with me in the office in 2 weeks for suture removal and conversion to a cast.  Alfredo MartinezJustin Ollis, PA-C was present and scrubbed for the duration of the case.  His assistance was essential in positioning the patient, prepping and  draping, gaining and maintaining exposure, performing the operation, closing and dressing the wounds, and applying the splint.  RADIOGRAPHS:  AP and lateral radiographs of the right forefoot and AP and lateral radiographs of the right midfoot show interval arthrodesis of the first TMT joint with appropriate correction of hallux valgus and metatarsus primus varus deformities.  Second and third metatarsal Weil osteotomies are noted with appropriate hardware placement.  Second and third hammertoe corrections are also noted with appropriate hardware placement.  No other acute injuries noted.     Toni Arthurs, MD     JH/MEDQ  D:  06/14/2016  T:  06/15/2016  Job:  [id number]

## 2016-06-21 ENCOUNTER — Encounter (HOSPITAL_BASED_OUTPATIENT_CLINIC_OR_DEPARTMENT_OTHER): Payer: Self-pay | Admitting: Orthopedic Surgery

## 2016-06-28 DIAGNOSIS — Z4789 Encounter for other orthopedic aftercare: Secondary | ICD-10-CM | POA: Diagnosis not present

## 2016-07-27 DIAGNOSIS — M21611 Bunion of right foot: Secondary | ICD-10-CM | POA: Diagnosis not present

## 2016-07-27 DIAGNOSIS — M2041 Other hammer toe(s) (acquired), right foot: Secondary | ICD-10-CM | POA: Diagnosis not present

## 2016-07-27 DIAGNOSIS — Z4789 Encounter for other orthopedic aftercare: Secondary | ICD-10-CM | POA: Diagnosis not present

## 2016-08-24 DIAGNOSIS — M2041 Other hammer toe(s) (acquired), right foot: Secondary | ICD-10-CM | POA: Diagnosis not present

## 2016-08-24 DIAGNOSIS — M21611 Bunion of right foot: Secondary | ICD-10-CM | POA: Diagnosis not present

## 2016-09-03 DIAGNOSIS — R0989 Other specified symptoms and signs involving the circulatory and respiratory systems: Secondary | ICD-10-CM | POA: Diagnosis not present

## 2016-09-03 DIAGNOSIS — Z23 Encounter for immunization: Secondary | ICD-10-CM | POA: Diagnosis not present

## 2016-09-04 ENCOUNTER — Other Ambulatory Visit: Payer: Self-pay | Admitting: Family Medicine

## 2016-09-04 DIAGNOSIS — R42 Dizziness and giddiness: Secondary | ICD-10-CM

## 2016-09-05 ENCOUNTER — Other Ambulatory Visit: Payer: Self-pay | Admitting: Family Medicine

## 2016-09-05 DIAGNOSIS — R002 Palpitations: Secondary | ICD-10-CM

## 2016-09-12 ENCOUNTER — Encounter: Payer: Self-pay | Admitting: Cardiology

## 2016-09-13 DIAGNOSIS — M1711 Unilateral primary osteoarthritis, right knee: Secondary | ICD-10-CM | POA: Diagnosis not present

## 2016-09-13 DIAGNOSIS — M25561 Pain in right knee: Secondary | ICD-10-CM | POA: Diagnosis not present

## 2016-09-14 ENCOUNTER — Encounter: Payer: Self-pay | Admitting: Cardiology

## 2016-09-14 ENCOUNTER — Ambulatory Visit (INDEPENDENT_AMBULATORY_CARE_PROVIDER_SITE_OTHER): Payer: PPO | Admitting: Cardiology

## 2016-09-14 VITALS — BP 160/58 | HR 88 | Ht 61.0 in | Wt 158.8 lb

## 2016-09-14 DIAGNOSIS — R0989 Other specified symptoms and signs involving the circulatory and respiratory systems: Secondary | ICD-10-CM

## 2016-09-14 DIAGNOSIS — R002 Palpitations: Secondary | ICD-10-CM

## 2016-09-14 DIAGNOSIS — I10 Essential (primary) hypertension: Secondary | ICD-10-CM | POA: Diagnosis not present

## 2016-09-14 LAB — CBC WITH DIFFERENTIAL/PLATELET
BASOS PCT: 0 %
Basophils Absolute: 0 cells/uL (ref 0–200)
EOS PCT: 1 %
Eosinophils Absolute: 58 cells/uL (ref 15–500)
HEMATOCRIT: 37.3 % (ref 35.0–45.0)
Hemoglobin: 12.3 g/dL (ref 11.7–15.5)
LYMPHS ABS: 1160 {cells}/uL (ref 850–3900)
Lymphocytes Relative: 20 %
MCH: 31 pg (ref 27.0–33.0)
MCHC: 33 g/dL (ref 32.0–36.0)
MCV: 94 fL (ref 80.0–100.0)
MONO ABS: 580 {cells}/uL (ref 200–950)
MPV: 10.1 fL (ref 7.5–12.5)
Monocytes Relative: 10 %
NEUTROS ABS: 4002 {cells}/uL (ref 1500–7800)
Neutrophils Relative %: 69 %
Platelets: 248 10*3/uL (ref 140–400)
RBC: 3.97 MIL/uL (ref 3.80–5.10)
RDW: 13.6 % (ref 11.0–15.0)
WBC: 5.8 10*3/uL (ref 3.8–10.8)

## 2016-09-14 LAB — MAGNESIUM: MAGNESIUM: 2.1 mg/dL (ref 1.5–2.5)

## 2016-09-14 LAB — BASIC METABOLIC PANEL
BUN: 19 mg/dL (ref 7–25)
CHLORIDE: 101 mmol/L (ref 98–110)
CO2: 27 mmol/L (ref 20–31)
CREATININE: 0.75 mg/dL (ref 0.50–0.99)
Calcium: 9.6 mg/dL (ref 8.6–10.4)
Glucose, Bld: 88 mg/dL (ref 65–99)
POTASSIUM: 4.8 mmol/L (ref 3.5–5.3)
Sodium: 138 mmol/L (ref 135–146)

## 2016-09-14 LAB — TSH: TSH: 2.17 mIU/L

## 2016-09-14 NOTE — Patient Instructions (Addendum)
Your physician recommends that you continue on your current medications as directed. Please refer to the Current Medication list given to you today.  Your physician recommends that you return for lab work in: today  TSH MAG CBC  AND BMET   Your physician has recommended that you wear an event monitor. Event monitors are medical devices that record the heart's electrical activity. Doctors most often us these monitors to diagnose arrhythmias. Arrhythmias are problems with the speed or rhythm of the heartbeat. The monitor is a small, portable device. You can wear one while you do your normal daily activities. This is usually used to diagnose what is causing palpitations/syncope (passing out).  30 DAY   Your physician recommends that you schedule a follow-up appointment in: 4 -6 WEEKS WITH BRITTAINY SIMMONS PA

## 2016-09-14 NOTE — Progress Notes (Signed)
09/14/2016 Carol Roberts   04/02/1946  [id number]  Primary Physician WEBB, Estill BattenAROL D, MD Primary Cardiologist: New (Dr. Delton SeeNelson, DOD)   Reason for Visit/CC: New Patient Evaluation for palpitations.   HPI:  The patient is a 70 y/o female, who presents to clinic today as a new patient. She has been referred by her PCP. Dr. Shirlean Mylararol Webb, at [facility], for evaluation for palpitations.   She denies any known history of CAD. She does report a family history of CAD. Her brother suffered a heart attack at the age of 162. Her mother also had CAD and required CABG surgery in her 2670s. The patient reports that when she was a teenager, she was told that she had tachycardia however she did not undergo any type of workup. She's never had a stress test. She's never had an echocardiogram. She denies exertional CP. Occasionally, she gets SOB with certain activities but denies resting dyspnea. She reports prior history of hypertension and HLD however she notes that she was able to discontinue her medications after weight loss. No known history of diabetes. No history of tobacco use.  3 weeks ago she had sudden onset of tachypalpitations that occurred at night while sleeping. This episode lasted approximate 45 minutes before resolving spontaneously. Her rhythm felt regular rate but her rate felt fast. She denies any associated dyspnea, chest pain, diaphoresis, lightheadedness, dizziness, syncope/near-syncope. This was her first episode since her teenage years. She denies any prolonged episode since that event 3 weeks ago, however she has had occasional flutters off and on for the past 3 weeks that last only a couple seconds at a time. She denies any excessive caffeine intake. She drinks alcohol only on social occasions. KKG today demonstrates normal sinus rhythm. Heart rate 88 bpm. Her PCP order for her to get an echocardiogram, which is scheduled for next week. She was also recently found to have a right sided  carotid bruit and is also scheduled to get bilateral carotid dopplers next week.   Current Meds  Medication Sig  . acetaminophen (TYLENOL) 500 MG tablet Take 1,000 mg by mouth every 6 (six) hours as needed for pain.  Marland Kitchen. aspirin EC 81 MG tablet Take 1 tablet (81 mg total) by mouth 2 (two) times daily.  . calcium-vitamin D (OSCAL WITH D) 500-200 MG-UNIT per tablet Take 1 tablet by mouth 2 (two) times daily.  . clindamycin (CLINDAGEL) 1 % gel Apply topically 2 (two) times daily.  Marland Kitchen. dicyclomine (BENTYL) 20 MG tablet Take 20 mg by mouth 4 (four) times daily as needed for spasms.   . Lactobacillus (ACIDOPHILUS PO) Take by mouth.  . loratadine (CLARITIN) 10 MG tablet Take 10 mg by mouth daily.  . Melatonin 5 MG TABS Take by mouth at bedtime as needed.  . Multiple Vitamins-Minerals (MULTIVITAMIN WITH MINERALS) tablet Take 1 tablet by mouth daily.   . Multiple Vitamins-Minerals (PRESERVISION AREDS 2) CAPS Take 1 tablet by mouth 2 (two) times daily.  . polyethylene glycol (MIRALAX / GLYCOLAX) packet Take 17 g by mouth daily.  . traMADol (ULTRAM) 50 MG tablet Take by mouth every 12 (twelve) hours as needed.  . tretinoin (RETIN-A) 0.01 % gel Apply topically at bedtime.   Allergies  Allergen Reactions  . Adhesive [Tape] Rash   Past Medical History:  Diagnosis Date  . Anemia    "off and on"  . Arthritis    knees, hands, hips - hx of dislocated hip many yrs ago   . Fibromyalgia   .  GERD (gastroesophageal reflux disease)   . H/O hiatal hernia    paraesophageal hernia, had surgery to repair  . Headache(784.0)    ocas migraines  . Paraesophageal hiatal hernia    causes vomiting, chest and left shoulder pain, sometimes left jaw pain   Family History  Problem Relation Age of Onset  . Heart disease Mother   . Heart attack Brother   . Heart disease Maternal Grandmother    Past Surgical History:  Procedure Laterality Date  . 2011 RIGHT KNEE ARTHROSCOPY    . APPENDECTOMY    . BUNIONECTOMY Right  06/14/2016   Procedure: RIGHT LAPIDUS AND MODIFIED MCBRIDE BUNIONECTOMY;  Surgeon: Toni Arthurs, MD;  Location: [facility];  Service: Orthopedics;  Laterality: Right;  RIGHT LAPIDUS AND MODIFIED MCBRIDE BUNIONECTOMY  . COLON SURGERY     bowel resection for diverticulitis  . COLOSTOMY CLOSURE  2010  . ESOPHAGEAL MANOMETRY N/A 06/08/2013   Procedure: ESOPHAGEAL MANOMETRY (EM);  Surgeon: Graylin Shiver, MD;  Location: [facility];  Service: Endoscopy;  Laterality: N/A;  Dr. Bosie Clos will read Manometry for Dr. Evette Cristal  . HAMMER TOE SURGERY Right 06/14/2016   Procedure: RIGHT TWO TO THREE HAMMER TOE CORRECTION;  Surgeon: Toni Arthurs, MD;  Location: [facility];  Service: Orthopedics;  Laterality: Right;  RIGHT TWO TO THREE HAMMER TOE CORRECTION  . HERNIA REPAIR    . HIATAL HERNIA REPAIR  2005   [place]  . HIATAL HERNIA REPAIR  2007   Lap with MTF bridge mesh.  Dr Daphine Deutscher,   . LAPAROSCOPIC INCISIONAL / UMBILICAL / VENTRAL HERNIA REPAIR  11/2010   lap VWH with mesh  . LEFT COLECTOMY  2010   with colostomy for perforated diverticulitis  . TENDON TRANSFER Right 06/14/2016   Procedure: FLEXOR TO EXTENSOR TRANSFER;  Surgeon: Toni Arthurs, MD;  Location: [facility];  Service: Orthopedics;  Laterality: Right;  FLEXOR TO EXTENSOR TRANSFER  . VENTRAL HERNIA REPAIR  09/06/2010  . WEIL OSTEOTOMY Right 06/14/2016   Procedure: TWO TO THREE METATARSAL WEIL OSTEOTOMY;  Surgeon: Toni Arthurs, MD;  Location: [facility];  Service: Orthopedics;  Laterality: Right;  TWO TO THREE METATARSAL WEIL OSTEOTOMY   Social History   Social History  . Marital status: Widowed    Spouse name: N/A  . Number of children: N/A  . Years of education: N/A   Occupational History  . Not on file.   Social History Main Topics  . Smoking status: Never Smoker  . Smokeless tobacco: Never Used  . Alcohol use Yes     Comment: social  . Drug use: No  . Sexual activity: No   Other  Topics Concern  . Not on file   Social History Narrative  . No narrative on file     Review of Systems: General: negative for chills, fever, night sweats or weight changes.  Cardiovascular: negative for chest pain, dyspnea on exertion, edema, orthopnea, palpitations, paroxysmal nocturnal dyspnea or shortness of breath Dermatological: negative for rash Respiratory: negative for cough or wheezing Urologic: negative for hematuria Abdominal: negative for nausea, vomiting, diarrhea, bright red blood per rectum, melena, or hematemesis Neurologic: negative for visual changes, syncope, or dizziness All other systems reviewed and are otherwise negative except as noted above.   Physical Exam:  Blood pressure (!) 160/58, pulse 88, height 5\' 1"  (1.549 m), weight 158 lb 12.8 oz (72 kg), SpO2 97 %.  General appearance: alert, cooperative and no distress Neck:  right sided carotid bruit. Left side is clear,  no JVD Lungs: clear to auscultation bilaterally Heart: regular rate and rhythm, S1, S2 normal, no murmur, click, rub or gallop Extremities: no LEE Pulses: 2+ and symmetric Skin: warm and dry Neurologic: Grossly normal  EKG NSR. No ischemia. 88 bpm   ASSESSMENT AND PLAN:   1. Palpitations: will further evaluate with a 30 day monitor. Will check a TSH, CBC, BMP and Mg level today. 2D echo already ordered by PCP. F/u in 4-6 weeks after monitor.   2. Right Carotid Bruit: asymptomatic. Bilateral carotid dopplers pending.   PLAN  Patient has been seen and examined by Dr. Delton See, DOD, who agrees with the above assessment and plan.   Robbie Lis PA-C 09/14/2016 4:01 PM   The patient was seen, examined and discussed with Madelin Weseman M. Sharol Harness, PA-C and I agree with the above.   This is a very pleasant 70 year old patient was referred to [place] for palpitations and carotid bruit. She states that she has had palpitations infrequently since she was teenager the last one she had at age of 53 and  then again about 3 weeks ago when it started while she was laying in bed and lasted 45 minutes. There was no associated chest pain shortness of breath or dizziness. No prior medical history of syncope. Patient has no family history of sudden cardiac death. She feels every day very short second lasting palpitations are not associated with any other symptoms. She denies any exertional dyspnea and orthopnea proximal nocturnal dyspnea. She denies any weakness in both of her extremities no visual changes. She is scheduled for an echocardiogram and cardiac ultrasound. Will follow on those. We will start 30 day event monitor to evaluate further for arrhythmias. This is most probably SVT, however with her infrequent episode there is a pictures that we don't catch, however he need to look for any evidence of A. fib. She has had hyperlipidemia in the past but after adjusting her diet and weight it improved and she has never been treated. Her blood pressure is elevated today. Since this is the first visit we will recheck again next time and based on the results of her echocardiogram and degree of LVH we'll adjust her medication.  Tobias Alexander 09/14/2016

## 2016-09-17 ENCOUNTER — Ambulatory Visit (HOSPITAL_COMMUNITY): Payer: PPO | Attending: Cardiovascular Disease

## 2016-09-17 ENCOUNTER — Other Ambulatory Visit: Payer: Self-pay

## 2016-09-17 DIAGNOSIS — R002 Palpitations: Secondary | ICD-10-CM

## 2016-09-17 LAB — ECHOCARDIOGRAM COMPLETE
AVPHT: 395 ms
CHL CUP RV SYS PRESS: 28 mmHg
CHL CUP TV REG PEAK VELOCITY: 252 cm/s
E decel time: 155 msec
EERAT: 12.97
FS: 34 % (ref 28–44)
IVS/LV PW RATIO, ED: 1.08
LA diam index: 2.34 cm/m2
LA vol A4C: 68 ml
LA vol index: 28.7 mL/m2
LASIZE: 40 mm
LAVOL: 49.1 mL
LDCA: 3.14 cm2
LEFT ATRIUM END SYS DIAM: 40 mm
LV TDI E'LATERAL: 7.18
LV TDI E'MEDIAL: 5.44
LV e' LATERAL: 7.18 cm/s
LVEEAVG: 12.97
LVEEMED: 12.97
LVOT VTI: 27.4 cm
LVOT diameter: 20 mm
LVOTPV: 93.1 cm/s
LVOTSV: 86 mL
MV Dec: 155
MV pk A vel: 99.4 m/s
MV pk E vel: 93.1 m/s
MVPG: 3 mmHg
PW: 10.8 mm — AB (ref 0.6–1.1)
RV LATERAL S' VELOCITY: 8.7 cm/s
RV TAPSE: 17.1 mm
TR max vel: 252 cm/s

## 2016-09-18 ENCOUNTER — Ambulatory Visit
Admission: RE | Admit: 2016-09-18 | Discharge: 2016-09-18 | Disposition: A | Discharge status: Home / Self Care | Payer: PPO | Source: Ambulatory Visit | Attending: Family Medicine | Admitting: Family Medicine

## 2016-09-18 ENCOUNTER — Telehealth: Payer: Self-pay | Admitting: Cardiology

## 2016-09-18 DIAGNOSIS — I6523 Occlusion and stenosis of bilateral carotid arteries: Secondary | ICD-10-CM | POA: Diagnosis not present

## 2016-09-18 DIAGNOSIS — R42 Dizziness and giddiness: Secondary | ICD-10-CM

## 2016-09-18 NOTE — Telephone Encounter (Signed)
New Message  Pt voiced returning nurses call from yesterday.  Please f/u

## 2016-09-18 NOTE — Telephone Encounter (Signed)
Provided patient with normal lab results.

## 2016-09-24 DIAGNOSIS — M79671 Pain in right foot: Secondary | ICD-10-CM | POA: Diagnosis not present

## 2016-09-24 DIAGNOSIS — Z4789 Encounter for other orthopedic aftercare: Secondary | ICD-10-CM | POA: Diagnosis not present

## 2016-09-26 ENCOUNTER — Ambulatory Visit (INDEPENDENT_AMBULATORY_CARE_PROVIDER_SITE_OTHER): Payer: PPO

## 2016-09-26 DIAGNOSIS — R002 Palpitations: Secondary | ICD-10-CM

## 2016-10-22 DIAGNOSIS — M25561 Pain in right knee: Secondary | ICD-10-CM | POA: Diagnosis not present

## 2016-10-22 DIAGNOSIS — M1711 Unilateral primary osteoarthritis, right knee: Secondary | ICD-10-CM | POA: Diagnosis not present

## 2016-10-29 DIAGNOSIS — M1711 Unilateral primary osteoarthritis, right knee: Secondary | ICD-10-CM | POA: Diagnosis not present

## 2016-11-05 ENCOUNTER — Ambulatory Visit: Payer: PPO | Admitting: Cardiology

## 2016-11-05 DIAGNOSIS — M25561 Pain in right knee: Secondary | ICD-10-CM | POA: Diagnosis not present

## 2016-11-06 ENCOUNTER — Encounter: Payer: Self-pay | Admitting: Cardiology

## 2016-11-06 ENCOUNTER — Ambulatory Visit (INDEPENDENT_AMBULATORY_CARE_PROVIDER_SITE_OTHER): Payer: PPO | Admitting: Cardiology

## 2016-11-06 VITALS — BP 146/82 | HR 84 | Ht 61.0 in

## 2016-11-06 DIAGNOSIS — R002 Palpitations: Secondary | ICD-10-CM | POA: Diagnosis not present

## 2016-11-06 DIAGNOSIS — R0989 Other specified symptoms and signs involving the circulatory and respiratory systems: Secondary | ICD-10-CM

## 2016-11-06 DIAGNOSIS — I10 Essential (primary) hypertension: Secondary | ICD-10-CM | POA: Diagnosis not present

## 2016-11-06 NOTE — Progress Notes (Signed)
11/06/2016 Carol Roberts   1946/09/10  [id number]  Primary Physician WEBB, Estill Batten, MD Primary Cardiologist: Dr. Delton See    Reason for Visit/CC: F/u for Palpitations   HPI:  Carol Roberts presents to clinic today for f/u after being seen as a new patient by Dr. Delton See on 09/14/16 for palpitations. Labs including TSH, CBC, BMP and Mg were all normal. 2D echo showed normal LV systolic function with EF of 60-65%, grade 1DD and normal wall motion. She also had mild AI and mild TR. 30 day monitor showed NSR and was unremarkable. She was also evaluated with bilateral carotid dopplers, given the presence of a bruit, which showed less than 50% bilateral carotid artery disease.   She reports that she has done well since her last OV. She continues to have an occasional palpitation from time to time. No chest pain. No dyspnea. She denies symptoms of dizziness, syncope/ near syncope or TIA. Her PCP follows her lipid panel yearly. Last assessment was 01/2016. LDL was 88 mg/dL. She is not currently on a statin but takes ASA daily.   Current Meds  Medication Sig  . acetaminophen (TYLENOL) 500 MG tablet Take 1,000 mg by mouth every 6 (six) hours as needed for pain.  Marland Kitchen aspirin EC 81 MG tablet Take 1 tablet (81 mg total) by mouth 2 (two) times daily.  . calcium-vitamin D (OSCAL WITH D) 500-200 MG-UNIT per tablet Take 1 tablet by mouth 2 (two) times daily.  . clindamycin (CLINDAGEL) 1 % gel Apply topically 2 (two) times daily.  Marland Kitchen dicyclomine (BENTYL) 20 MG tablet Take 20 mg by mouth 4 (four) times daily as needed for spasms.   . Lactobacillus (ACIDOPHILUS PO) Take by mouth.  . loratadine (CLARITIN) 10 MG tablet Take 10 mg by mouth daily as needed for allergies.   . Melatonin 5 MG TABS Take by mouth at bedtime as needed.  . Multiple Vitamins-Minerals (MULTIVITAMIN WITH MINERALS) tablet Take 1 tablet by mouth daily.   . Multiple Vitamins-Minerals (PRESERVISION AREDS 2) CAPS Take 1 tablet by mouth 2 (two) times  daily.  . polyethylene glycol (MIRALAX / GLYCOLAX) packet Take 17 g by mouth daily.  . traMADol (ULTRAM) 50 MG tablet Take by mouth every 12 (twelve) hours as needed for moderate pain.   Marland Kitchen tretinoin (RETIN-A) 0.01 % gel Apply topically at bedtime.   Allergies  Allergen Reactions  . Adhesive [Tape] Rash   Past Medical History:  Diagnosis Date  . Anemia    "off and on"  . Arthritis    knees, hands, hips - hx of dislocated hip many yrs ago   . Fibromyalgia   . GERD (gastroesophageal reflux disease)   . H/O hiatal hernia    paraesophageal hernia, had surgery to repair  . Headache(784.0)    ocas migraines  . Paraesophageal hiatal hernia    causes vomiting, chest and left shoulder pain, sometimes left jaw pain   Family History  Problem Relation Age of Onset  . Heart disease Mother   . Heart attack Brother   . Heart disease Maternal Grandmother    Past Surgical History:  Procedure Laterality Date  . 2011 RIGHT KNEE ARTHROSCOPY    . APPENDECTOMY    . BUNIONECTOMY Right 06/14/2016   Procedure: RIGHT LAPIDUS AND MODIFIED MCBRIDE BUNIONECTOMY;  Surgeon: Toni Arthurs, MD;  Location: [facility];  Service: Orthopedics;  Laterality: Right;  RIGHT LAPIDUS AND MODIFIED MCBRIDE BUNIONECTOMY  . COLON SURGERY     bowel  resection for diverticulitis  . COLOSTOMY CLOSURE  2010  . ESOPHAGEAL MANOMETRY N/A 06/08/2013   Procedure: ESOPHAGEAL MANOMETRY (EM);  Surgeon: Graylin ShiverSalem F Ganem, MD;  Location: [facility];  Service: Endoscopy;  Laterality: N/A;  Dr. Bosie ClosSchooler will read Manometry for Dr. Evette CristalGanem  . HAMMER TOE SURGERY Right 06/14/2016   Procedure: RIGHT TWO TO THREE HAMMER TOE CORRECTION;  Surgeon: Toni ArthursJohn Hewitt, MD;  Location: [facility];  Service: Orthopedics;  Laterality: Right;  RIGHT TWO TO THREE HAMMER TOE CORRECTION  . HERNIA REPAIR    . HIATAL HERNIA REPAIR  2005   [place]  . HIATAL HERNIA REPAIR  2007   Lap with MTF bridge mesh.  Dr Daphine DeutscherMartin,   . LAPAROSCOPIC  INCISIONAL / UMBILICAL / VENTRAL HERNIA REPAIR  11/2010   lap VWH with mesh  . LEFT COLECTOMY  2010   with colostomy for perforated diverticulitis  . TENDON TRANSFER Right 06/14/2016   Procedure: FLEXOR TO EXTENSOR TRANSFER;  Surgeon: Toni ArthursJohn Hewitt, MD;  Location: [facility];  Service: Orthopedics;  Laterality: Right;  FLEXOR TO EXTENSOR TRANSFER  . VENTRAL HERNIA REPAIR  09/06/2010  . WEIL OSTEOTOMY Right 06/14/2016   Procedure: TWO TO THREE METATARSAL WEIL OSTEOTOMY;  Surgeon: Toni ArthursJohn Hewitt, MD;  Location: [facility];  Service: Orthopedics;  Laterality: Right;  TWO TO THREE METATARSAL WEIL OSTEOTOMY   Social History   Social History  . Marital status: Widowed    Spouse name: N/A  . Number of children: N/A  . Years of education: N/A   Occupational History  . Not on file.   Social History Main Topics  . Smoking status: Never Smoker  . Smokeless tobacco: Never Used  . Alcohol use Yes     Comment: social  . Drug use: No  . Sexual activity: No   Other Topics Concern  . Not on file   Social History Narrative  . No narrative on file     Review of Systems: General: negative for chills, fever, night sweats or weight changes.  Cardiovascular: negative for chest pain, dyspnea on exertion, edema, orthopnea, palpitations, paroxysmal nocturnal dyspnea or shortness of breath Dermatological: negative for rash Respiratory: negative for cough or wheezing Urologic: negative for hematuria Abdominal: negative for nausea, vomiting, diarrhea, bright red blood per rectum, melena, or hematemesis Neurologic: negative for visual changes, syncope, or dizziness All other systems reviewed and are otherwise negative except as noted above.   Physical Exam:  Blood pressure (!) 146/82, pulse 84, height 5\' 1"  (1.549 m), SpO2 97 %.  General appearance: alert, cooperative and no distress Neck: right sided carotid bruit. Left side is clear,  no JVD Lungs: clear to auscultation  bilaterally Heart: regular rate and rhythm, S1, S2 normal, no murmur, click, rub or gallop Extremities: no LEE Pulses: 2+ and symmetric Skin: warm and dry Neurologic: Grossly normal   EKG not performed   ASSESSMENT AND PLAN:   1. Palpitations: 30 day monitor showed NSR w/o arrhthymias. TSH, CBC, BMP and Mg level WNL. 2D echo showed normal LVEF. No significant valvular abnormalities. Pt still with occasional "flutters". This may be due to anxiety which she admits to having. Patient also advised to reduce caffeine intake.   2. Right Carotid Bruit: asymptomatic. Bilateral carotid dopplers showed less than 50% bilateral carotid artery disease. LDL 01/2016 was 88 mg/dL. This is being followed by PCP. Recommend repeat dopplers in 2 years or when clinically indicated. Continue ASA.    PLAN  F/u  with Dr. Delton SeeNelson 09/2016 for yearly f/u.   Robbie LisBrittainy Rankin Coolman PA-C 11/06/2016 4:05 PM

## 2016-11-06 NOTE — Patient Instructions (Addendum)
Medication Instructions:  None ordered  Labwork: None ordered  Testing/Procedures: None ordered  Follow-Up: Your physician wants you to follow-up in: 10 MONTHS WITH DR. Johnell ComingsNELSON   You will receive a reminder letter in the mail two months in advance. If you don't receive a letter, please call our office to schedule the follow-up appointment.    Any Other Special Instructions Will Be Listed Below (If Applicable).     If you need a refill on your cardiac medications before your next appointment, please call your pharmacy.

## 2016-12-19 DIAGNOSIS — M25561 Pain in right knee: Secondary | ICD-10-CM | POA: Diagnosis not present

## 2016-12-19 DIAGNOSIS — M1711 Unilateral primary osteoarthritis, right knee: Secondary | ICD-10-CM | POA: Diagnosis not present

## 2017-02-20 ENCOUNTER — Other Ambulatory Visit: Payer: Self-pay | Admitting: Family Medicine

## 2017-02-20 DIAGNOSIS — I10 Essential (primary) hypertension: Secondary | ICD-10-CM | POA: Diagnosis not present

## 2017-02-20 DIAGNOSIS — K589 Irritable bowel syndrome without diarrhea: Secondary | ICD-10-CM | POA: Diagnosis not present

## 2017-02-20 DIAGNOSIS — Z1231 Encounter for screening mammogram for malignant neoplasm of breast: Secondary | ICD-10-CM

## 2017-02-20 DIAGNOSIS — E785 Hyperlipidemia, unspecified: Secondary | ICD-10-CM | POA: Diagnosis not present

## 2017-02-20 DIAGNOSIS — Z0000 Encounter for general adult medical examination without abnormal findings: Secondary | ICD-10-CM | POA: Diagnosis not present

## 2017-03-12 ENCOUNTER — Ambulatory Visit
Admission: RE | Admit: 2017-03-12 | Discharge: 2017-03-12 | Disposition: A | Discharge status: Home / Self Care | Payer: PPO | Source: Ambulatory Visit | Attending: Family Medicine | Admitting: Family Medicine

## 2017-03-12 DIAGNOSIS — Z1231 Encounter for screening mammogram for malignant neoplasm of breast: Secondary | ICD-10-CM

## 2017-03-28 DIAGNOSIS — E2839 Other primary ovarian failure: Secondary | ICD-10-CM | POA: Diagnosis not present

## 2017-03-28 DIAGNOSIS — M8588 Other specified disorders of bone density and structure, other site: Secondary | ICD-10-CM | POA: Diagnosis not present

## 2017-04-19 DIAGNOSIS — J018 Other acute sinusitis: Secondary | ICD-10-CM | POA: Diagnosis not present

## 2017-04-29 DIAGNOSIS — J329 Chronic sinusitis, unspecified: Secondary | ICD-10-CM | POA: Diagnosis not present

## 2017-08-30 DIAGNOSIS — Z23 Encounter for immunization: Secondary | ICD-10-CM | POA: Diagnosis not present

## 2017-10-03 ENCOUNTER — Encounter: Payer: Self-pay | Admitting: Cardiology

## 2017-10-03 ENCOUNTER — Ambulatory Visit (INDEPENDENT_AMBULATORY_CARE_PROVIDER_SITE_OTHER): Payer: PPO | Admitting: Cardiology

## 2017-10-03 VITALS — BP 124/64 | HR 71 | Ht 61.0 in | Wt 160.0 lb

## 2017-10-03 DIAGNOSIS — Z8249 Family history of ischemic heart disease and other diseases of the circulatory system: Secondary | ICD-10-CM | POA: Diagnosis not present

## 2017-10-03 DIAGNOSIS — R0989 Other specified symptoms and signs involving the circulatory and respiratory systems: Secondary | ICD-10-CM

## 2017-10-03 DIAGNOSIS — R002 Palpitations: Secondary | ICD-10-CM

## 2017-10-03 DIAGNOSIS — R0609 Other forms of dyspnea: Secondary | ICD-10-CM

## 2017-10-03 MED ORDER — ASPIRIN EC 81 MG PO TBEC: 81.0000 mg | DELAYED_RELEASE_TABLET | Freq: Every day | ORAL | Status: AC

## 2017-10-03 NOTE — Progress Notes (Signed)
Cardiology Office Note:    Date:  10/03/2017   ID:  ICHELLE HARRAL, DOB 09/04/1946, MRN [id number]  PCP:  Shirlean Mylar, MD  Cardiologist:  Tobias Alexander, MD   Referring MD: Shirlean Mylar, MD   Chief complaint: Fatigue and dyspnea on exertion  History of Present Illness:    Carol Roberts is a 71 y.o. female with a hx of CAD. She does report a family history of CAD. Her brother suffered a heart attack at the age of 28. Her mother also had CAD and required CABG surgery in her 29s. The patient reports that when she was a teenager, she was told that she had tachycardia however she did not undergo any type of workup. She's never had a stress test. She's never had an echocardiogram. She denies exertional CP. Occasionally, she gets SOB with certain activities but denies resting dyspnea. She reports prior history of hypertension and HLD however she notes that she was able to discontinue her medications after weight loss. No known history of diabetes. No history of tobacco use.  3 weeks ago she had sudden onset of tachypalpitations that occurred at night while sleeping. This episode lasted approximate 45 minutes before resolving spontaneously. Her rhythm felt regular rate but her rate felt fast. She denies any associated dyspnea, chest pain, diaphoresis, lightheadedness, dizziness, syncope/near-syncope. This was her first episode since her teenage years. She denies any prolonged episode since that event 3 weeks ago, however she has had occasional flutters off and on for the past 3 weeks that last only a couple seconds at a time. She denies any excessive caffeine intake. She drinks alcohol only on social occasions. EKG today demonstrates normal sinus rhythm. Heart rate 88 bpm. Her PCP order for her to get an echocardiogram, which is scheduled for next week. She was also recently found to have a right sided carotid bruit and is also scheduled to get bilateral carotid dopplers next week.   10/03/2017, the  patient is coming after one year, patient denies any further episodes of palpitations. She had 30 day event monitor didn't show any significant arrhythmias. She denies any dizziness or syncope. She has noticed fatigue and dyspnea on exertion when walking stairs but is attributing it to being out of shape. She denies any lower extremity edema orthopnea or proximal nocturnal dyspnea. She has been compliant with her medications and has no side effects.  Past Medical History:  Diagnosis Date  . Anemia    "off and on"  . Arthritis    knees, hands, hips - hx of dislocated hip many yrs ago   . Fibromyalgia   . GERD (gastroesophageal reflux disease)   . H/O hiatal hernia    paraesophageal hernia, had surgery to repair  . Headache(784.0)    ocas migraines  . Paraesophageal hiatal hernia    causes vomiting, chest and left shoulder pain, sometimes left jaw pain    Past Surgical History:  Procedure Laterality Date  . 2011 RIGHT KNEE ARTHROSCOPY    . APPENDECTOMY    . BREAST EXCISIONAL BIOPSY Left 1987  . BUNIONECTOMY Right 06/14/2016   Procedure: RIGHT LAPIDUS AND MODIFIED MCBRIDE BUNIONECTOMY;  Surgeon: Toni Arthurs, MD;  Location: [facility];  Service: Orthopedics;  Laterality: Right;  RIGHT LAPIDUS AND MODIFIED MCBRIDE BUNIONECTOMY  . COLON SURGERY     bowel resection for diverticulitis  . COLOSTOMY CLOSURE  2010  . ESOPHAGEAL MANOMETRY N/A 06/08/2013   Procedure: ESOPHAGEAL MANOMETRY (EM);  Surgeon: Graylin Shiver,  MD;  Location: [facility];  Service: Endoscopy;  Laterality: N/A;  Dr. Bosie Clos will read Manometry for Dr. Evette Cristal  . HAMMER TOE SURGERY Right 06/14/2016   Procedure: RIGHT TWO TO THREE HAMMER TOE CORRECTION;  Surgeon: Toni Arthurs, MD;  Location: [facility];  Service: Orthopedics;  Laterality: Right;  RIGHT TWO TO THREE HAMMER TOE CORRECTION  . HERNIA REPAIR    . HIATAL HERNIA REPAIR  2005   [place]  . HIATAL HERNIA REPAIR  2007   Lap with MTF  bridge mesh.  Dr Daphine Deutscher,   . LAPAROSCOPIC INCISIONAL / UMBILICAL / VENTRAL HERNIA REPAIR  11/2010   lap VWH with mesh  . LEFT COLECTOMY  2010   with colostomy for perforated diverticulitis  . TENDON TRANSFER Right 06/14/2016   Procedure: FLEXOR TO EXTENSOR TRANSFER;  Surgeon: Toni Arthurs, MD;  Location: [facility];  Service: Orthopedics;  Laterality: Right;  FLEXOR TO EXTENSOR TRANSFER  . VENTRAL HERNIA REPAIR  09/06/2010  . WEIL OSTEOTOMY Right 06/14/2016   Procedure: TWO TO THREE METATARSAL WEIL OSTEOTOMY;  Surgeon: Toni Arthurs, MD;  Location: [facility];  Service: Orthopedics;  Laterality: Right;  TWO TO THREE METATARSAL WEIL OSTEOTOMY    Current Medications: No outpatient prescriptions have been marked as taking for the 10/03/17 encounter (Office Visit) with Lars Masson, MD.     Allergies:   Adhesive [tape]   Social History   Social History  . Marital status: Widowed    Spouse name: N/A  . Number of children: N/A  . Years of education: N/A   Social History Main Topics  . Smoking status: Never Smoker  . Smokeless tobacco: Never Used  . Alcohol use Yes     Comment: social  . Drug use: No  . Sexual activity: No   Other Topics Concern  . None   Social History Narrative  . None     Family History: The patient's family history includes Heart attack in her brother; Heart disease in her maternal grandmother and mother. ROS:   Please see the history of present illness.     All other systems reviewed and are negative.  EKGs/Labs/Other Studies Reviewed:    The following studies were reviewed today:  Recent Labs: No results found for requested labs within last 8760 hours.  Recent Lipid Panel No results found for: CHOL, TRIG, HDL, CHOLHDL, VLDL, LDLCALC, LDLDIRECT  Physical Exam:    VS:  BP 124/64   Pulse 71   Ht 5\' 1"  (1.549 m)   Wt 160 lb (72.6 kg)   SpO2 98%   BMI 30.23 kg/m     Wt Readings from Last 3 Encounters:  10/03/17  160 lb (72.6 kg)  09/14/16 158 lb 12.8 oz (72 kg)  06/14/16 160 lb (72.6 kg)     GEN:  Well nourished, well developed in no acute distress HEENT: Normal NECK: No JVD; Right carotid bruit LYMPHATICS: No lymphadenopathy CARDIAC: RRR, 2/6 murmurs, rubs, gallops RESPIRATORY:  Clear to auscultation without rales, wheezing or rhonchi  ABDOMEN: Soft, non-tender, non-distended MUSCULOSKELETAL:  No edema; No deformity  SKIN: Warm and dry NEUROLOGIC:  Alert and oriented x 3 PSYCHIATRIC:  Normal affect   TTE: 09/17/2016 - Left ventricle: The cavity size was normal. There was mild   concentric hypertrophy. Systolic function was normal. The   estimated ejection fraction was in the range of 60% to 65%. Wall   motion was normal; there were no regional wall motion  abnormalities. Doppler parameters are consistent with abnormal   left ventricular relaxation (grade 1 diastolic dysfunction).   Doppler parameters are consistent with high ventricular filling   pressure. - Aortic valve: Transvalvular velocity was within the normal range.   There was no stenosis. There was mild regurgitation. - Mitral valve: Transvalvular velocity was within the normal range.   There was no evidence for stenosis. There was no regurgitation. - Left atrium: The atrium was moderately dilated. - Right ventricle: The cavity size was normal. Wall thickness was   normal. Systolic function was normal. - Right atrium: The atrium was mildly dilated. - Atrial septum: No defect or patent foramen ovale was identified   by color flow Doppler. - Tricuspid valve: There was mild regurgitation. - Pulmonary arteries: Systolic pressure was within the normal   range. PA peak pressure: 33 mm Hg (S).  EKG performed today 10/03/2017 was personally reviewed and shows normal sinus rhythm with nonspecific T-wave abnormalities unchanged from prior.    ASSESSMENT:    1. DOE (dyspnea on exertion)   2. Family history of early CAD   3.  Right carotid bruit   4. Palpitation    PLAN:    In order of problems listed above:  1. Dyspnea on exertion and significant family history of premature coronary artery disease, we'll order a calcium score and coronary CTA especially in a patient that has significant family history and is not being treated with any lipid agent.  2. Palpitations:  30 day monitor was normal and so were all the labs including TSH. Echocardiogram showed normal LVEF with no regional wall motion abnormalities.   3. Right Carotid Bruit:  bilateral carotid duplex showed less than 50% stenosis in the bilateral internal carotid arteries. Tortuosity results an elevated velocities.  Medication Adjustments/Labs and Tests Ordered: Current medicines are reviewed at length with the patient today.  Concerns regarding medicines are outlined above.  Orders Placed This Encounter  Procedures  . CT CORONARY MORPH W/CTA COR W/SCORE W/CA W/CM &/OR WO/CM  . CT CORONARY FRACTIONAL FLOW RESERVE DATA PREP  . CT CORONARY FRACTIONAL FLOW RESERVE FLUID ANALYSIS  . EKG 12-Lead   Meds ordered this encounter  Medications  . aspirin EC 81 MG tablet    Sig: Take 1 tablet (81 mg total) by mouth daily.    Signed, Tobias AlexanderKatarina Taten Merrow, MD  10/03/2017 1:30 PM    [facility]

## 2017-10-03 NOTE — Patient Instructions (Signed)
Medication Instructions:   DECREASE YOUR ASPIRIN TO 81 MG ONCE DAILY      Testing/Procedures:  CORONARY CT WITH FFR  Please arrive at the [facility] main entrance of [facility] at xx:xx AM (30-45 minutes prior to test start time)  [facility] [address] [place], [place] [address] [phone]  Proceed to the [facility] Radiology Department (First Floor).  Please follow these instructions carefully (unless otherwise directed):    On the Night Before the Test: . Drink plenty of water. . Do not consume any caffeinated/decaffeinated beverages or chocolate 12 hours prior to your test. . Do not take any antihistamines 12 hours prior to your test.    On the Day of the Test: . Drink plenty of water. Do not drink any water within one hour of the test. . Do not eat any food 4 hours prior to the test. . You may take your regular medications prior to the test. . IF NOT ON A BETA BLOCKER - Take 50 mg of lopressor (metoprolol) one hour before the test.   After the Test: . Drink plenty of water. . After receiving IV contrast, you may experience a mild flushed feeling. This is normal. . On occasion, you may experience a mild rash up to 24 hours after the test. This is not dangerous. If this occurs, you can take Benadryl 25 mg and increase your fluid intake. . If you experience trouble breathing, this can be serious. If it is severe call 911 IMMEDIATELY. If it is mild, please call our office.     Follow-Up:  Your physician wants you to follow-up in: 6 MONTHS WITH DR Johnell ComingsNELSON You will receive a reminder letter in the mail two months in advance. If you don't receive a letter, please call our office to schedule the follow-up appointment.        If you need a refill on your cardiac medications before your next appointment, please call your pharmacy.

## 2017-10-15 ENCOUNTER — Telehealth: Payer: Self-pay | Admitting: *Deleted

## 2017-10-15 NOTE — Telephone Encounter (Signed)
-----   Message from Lars MassonKatarina H Nelson, MD sent at 10/15/2017 11:12 AM EST ----- Regarding: RE: 10/15/17  Carol Roberts,  THIS PATIENT NEEDS TO REPEAT HER LABS.(CARDIAC CT)   THANKS No need  ----- Message ----- From: Loa SocksMartin, Aviona Martenson M, LPN Sent: 16/1/096011/05/2017  10:20 AM To: Lars MassonKatarina H Nelson, MD Subject: FW: 10/15/17  [name],  THIS PATIENT NEEDS TO REP#  Dr. Delton SeeNelson, this pt had a BMET done by our office on 10/6 and it was normal.  Does she need to have a repeat BMET if her coronary CT is scheduled for 11/23?  Wanted to ask you first before having her come back for lab.    Thanks,   Lajoyce CornersIvy  ----- Message ----- From: Gerome SamMiller, Deborah D Sent: 10/15/2017  10:12 AM To: Loa SocksIvy M Leeta Grimme, LPN Subject: 45/03/910/6/18  [name],  THIS PATIENT NEEDS TO REPEAT #

## 2017-10-17 ENCOUNTER — Telehealth: Payer: Self-pay | Admitting: *Deleted

## 2017-10-17 ENCOUNTER — Encounter: Payer: Self-pay | Admitting: *Deleted

## 2017-10-17 DIAGNOSIS — Z01812 Encounter for preprocedural laboratory examination: Secondary | ICD-10-CM | POA: Insufficient documentation

## 2017-10-17 MED ORDER — METOPROLOL TARTRATE 50 MG PO TABS: 50.0000 mg | ORAL_TABLET | Freq: Once | ORAL | 0 refills | Status: DC

## 2017-10-17 NOTE — Telephone Encounter (Signed)
Spoke with the pt and informed her of her coronary ct instructions, as indicated in this message.  Informed the pt that I will call in a one time dose of metoprolol tartrate 50 mg po take 1 hour prior to her scheduled ct.  Confirmed the pharmacy of choice with the pt.  Pt verbalized understanding and agrees with this plan.

## 2017-10-17 NOTE — Telephone Encounter (Signed)
Dear Carol Livingsarolyn Gervasi,  Please arrive at the [facility] entrance of [facility] at 2:00 PM ON 11/01/17 (30-45 minutes prior to test start time)  [facility] [address] [place], [place] [address] [phone]  Proceed to the [facility] Radiology Department (First Floor).  Please follow these instructions carefully (unless otherwise directed):   On the Night Before the Test:  Drink plenty of water.  Do not consume any caffeinated/decaffeinated beverages or chocolate 12 hours prior to your test.  Do not take any antihistamines 12 hours prior to your test.   On the Day of the Test:  Drink plenty of water. Do not drink any water within one hour of the test.  Do not eat any food 4 hours prior to the test.  You may take your regular medications prior to the test.  IF NOT ON A BETA BLOCKER - Take 50 mg of lopressor (metoprolol) one hour before the test.  After the Test:  Drink plenty of water.  After receiving IV contrast, you may experience a mild flushed feeling. This is normal.  On occasion, you may experience a mild rash up to 24 hours after the test. This is not dangerous. If this occurs, you can take Benadryl 25 mg and increase your fluid intake.  If you experience trouble breathing, this can be serious. If it is severe call 911 IMMEDIATELY. If it is mild, please call our office.

## 2017-10-21 ENCOUNTER — Other Ambulatory Visit: Payer: PPO

## 2017-10-23 ENCOUNTER — Other Ambulatory Visit: Payer: PPO

## 2017-11-01 ENCOUNTER — Ambulatory Visit (HOSPITAL_COMMUNITY)
Admission: RE | Admit: 2017-11-01 | Discharge: 2017-11-01 | Disposition: A | Discharge status: Home / Self Care | Payer: PPO | Source: Ambulatory Visit | Attending: Cardiology | Admitting: Cardiology

## 2017-11-01 ENCOUNTER — Ambulatory Visit (HOSPITAL_COMMUNITY): Admission: RE | Admit: 2017-11-01 | Payer: PPO | Source: Ambulatory Visit

## 2017-11-01 DIAGNOSIS — R0609 Other forms of dyspnea: Secondary | ICD-10-CM | POA: Insufficient documentation

## 2017-11-01 DIAGNOSIS — Z8249 Family history of ischemic heart disease and other diseases of the circulatory system: Secondary | ICD-10-CM | POA: Insufficient documentation

## 2017-11-01 DIAGNOSIS — R079 Chest pain, unspecified: Secondary | ICD-10-CM | POA: Diagnosis not present

## 2017-11-01 LAB — POCT I-STAT CREATININE: Creatinine, Ser: 0.5 mg/dL (ref 0.44–1.00)

## 2017-11-01 MED ORDER — IOPAMIDOL (ISOVUE-370) INJECTION 76%
INTRAVENOUS | Status: AC
  Filled 2017-11-01: qty 100

## 2017-11-01 MED ORDER — NITROGLYCERIN 0.4 MG SL SUBL
SUBLINGUAL_TABLET | SUBLINGUAL | Status: AC
  Filled 2017-11-01: qty 2

## 2017-11-01 MED ORDER — METOPROLOL TARTRATE 5 MG/5ML IV SOLN
5.0000 mg | INTRAVENOUS | Status: DC | PRN
  Administered 2017-11-01 (×2): 5 mg via INTRAVENOUS
  Filled 2017-11-01 (×3): qty 5

## 2017-11-01 MED ORDER — METOPROLOL TARTRATE 5 MG/5ML IV SOLN
INTRAVENOUS | Status: AC
  Filled 2017-11-01: qty 15

## 2017-11-01 MED ORDER — NITROGLYCERIN 0.4 MG SL SUBL
0.8000 mg | SUBLINGUAL_TABLET | Freq: Once | SUBLINGUAL | Status: DC
  Filled 2017-11-01: qty 25

## 2017-12-06 DIAGNOSIS — R197 Diarrhea, unspecified: Secondary | ICD-10-CM | POA: Diagnosis not present

## 2017-12-06 DIAGNOSIS — R1013 Epigastric pain: Secondary | ICD-10-CM | POA: Diagnosis not present

## 2018-02-14 ENCOUNTER — Ambulatory Visit: Payer: PPO | Admitting: Cardiology

## 2018-02-14 ENCOUNTER — Encounter: Payer: Self-pay | Admitting: Cardiology

## 2018-02-14 VITALS — BP 150/94 | HR 76 | Ht 61.0 in | Wt 150.0 lb

## 2018-02-14 DIAGNOSIS — I10 Essential (primary) hypertension: Secondary | ICD-10-CM | POA: Diagnosis not present

## 2018-02-14 DIAGNOSIS — R0989 Other specified symptoms and signs involving the circulatory and respiratory systems: Secondary | ICD-10-CM | POA: Diagnosis not present

## 2018-02-14 DIAGNOSIS — R002 Palpitations: Secondary | ICD-10-CM

## 2018-02-14 DIAGNOSIS — R0609 Other forms of dyspnea: Secondary | ICD-10-CM

## 2018-02-14 NOTE — Progress Notes (Signed)
Cardiology Office Note:    Date:  02/14/2018   ID:  Carol Roberts, DOB 1946/02/03, MRN [id number]  PCP:  Shirlean Mylar, MD  Cardiologist:  Tobias Alexander, MD   Referring MD: Shirlean Mylar, MD   Chief complaint: Fatigue and dyspnea on exertion  History of Present Illness:    Carol Roberts is a 72 y.o. female with a hx of CAD. She does report a family history of CAD. Her brother suffered a heart attack at the age of 8. Her mother also had CAD and required CABG surgery in her 25s. The patient reports that when she was a teenager, she was told that she had tachycardia however she did not undergo any type of workup. She's never had a stress test. She's never had an echocardiogram. She denies exertional CP. Occasionally, she gets SOB with certain activities but denies resting dyspnea. She reports prior history of hypertension and HLD however she notes that she was able to discontinue her medications after weight loss. No known history of diabetes. No history of tobacco use.  3 weeks ago she had sudden onset of tachypalpitations that occurred at night while sleeping. This episode lasted approximate 45 minutes before resolving spontaneously. Her rhythm felt regular rate but her rate felt fast. She denies any associated dyspnea, chest pain, diaphoresis, lightheadedness, dizziness, syncope/near-syncope. This was her first episode since her teenage years. She denies any prolonged episode since that event 3 weeks ago, however she has had occasional flutters off and on for the past 3 weeks that last only a couple seconds at a time. She denies any excessive caffeine intake. She drinks alcohol only on social occasions. EKG today demonstrates normal sinus rhythm. Heart rate 88 bpm. Her PCP order for her to get an echocardiogram, which is scheduled for next week. She was also recently found to have a right sided carotid bruit and is also scheduled to get bilateral carotid dopplers next week.   10/03/2017, the  patient is coming after one year, patient denies any further episodes of palpitations. She had 30 day event monitor didn't show any significant arrhythmias. She denies any dizziness or syncope. She has noticed fatigue and dyspnea on exertion when walking stairs but is attributing it to being out of shape. She denies any lower extremity edema orthopnea or proximal nocturnal dyspnea. She has been compliant with her medications and has no side effects.  02/14/2018 - 6 months follow up, she underwent coronary CTA in 10/2017 that showed normal coronaries and calcium score 1. Her lipids are being checked by Dr. Hyman Hopes and most recently they were within normal range. She hasn't been very active however denies any chest pain, gets short of breath on moderate exertion, she continues to have palpitations that last seconds there not associated with dizziness or syncope. She feels them every day but now that she knows they are not dangerous she doesn't get anxious about it. 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  Past Medical History:  Diagnosis Date  . Anemia    "off and on"  . Arthritis    knees, hands, hips - hx of dislocated hip many yrs ago   . Fibromyalgia   . GERD (gastroesophageal reflux disease)   . H/O hiatal hernia    paraesophageal hernia, had surgery to repair  . Headache(784.0)    ocas migraines  . Paraesophageal hiatal hernia    causes vomiting, chest and left shoulder pain, sometimes left jaw pain  Past Surgical History:  Procedure Laterality Date  . 2011 RIGHT KNEE ARTHROSCOPY    . APPENDECTOMY    . BREAST EXCISIONAL BIOPSY Left 1987  . BUNIONECTOMY Right 06/14/2016   Procedure: RIGHT LAPIDUS AND MODIFIED MCBRIDE BUNIONECTOMY;  Surgeon: Toni ArthursJohn Hewitt, MD;  Location: [facility];  Service: Orthopedics;  Laterality: Right;  RIGHT LAPIDUS AND MODIFIED MCBRIDE BUNIONECTOMY  . COLON SURGERY     bowel resection for diverticulitis  . COLOSTOMY CLOSURE  2010   . ESOPHAGEAL MANOMETRY N/A 06/08/2013   Procedure: ESOPHAGEAL MANOMETRY (EM);  Surgeon: Graylin ShiverSalem F Ganem, MD;  Location: [facility];  Service: Endoscopy;  Laterality: N/A;  Dr. Bosie ClosSchooler will read Manometry for Dr. Evette CristalGanem  . HAMMER TOE SURGERY Right 06/14/2016   Procedure: RIGHT TWO TO THREE HAMMER TOE CORRECTION;  Surgeon: Toni ArthursJohn Hewitt, MD;  Location: [facility];  Service: Orthopedics;  Laterality: Right;  RIGHT TWO TO THREE HAMMER TOE CORRECTION  . HERNIA REPAIR    . HIATAL HERNIA REPAIR  2005   [place]  . HIATAL HERNIA REPAIR  2007   Lap with MTF bridge mesh.  Dr Daphine DeutscherMartin,   . LAPAROSCOPIC INCISIONAL / UMBILICAL / VENTRAL HERNIA REPAIR  11/2010   lap VWH with mesh  . LEFT COLECTOMY  2010   with colostomy for perforated diverticulitis  . TENDON TRANSFER Right 06/14/2016   Procedure: FLEXOR TO EXTENSOR TRANSFER;  Surgeon: Toni ArthursJohn Hewitt, MD;  Location: [facility];  Service: Orthopedics;  Laterality: Right;  FLEXOR TO EXTENSOR TRANSFER  . VENTRAL HERNIA REPAIR  09/06/2010  . WEIL OSTEOTOMY Right 06/14/2016   Procedure: TWO TO THREE METATARSAL WEIL OSTEOTOMY;  Surgeon: Toni ArthursJohn Hewitt, MD;  Location: [facility];  Service: Orthopedics;  Laterality: Right;  TWO TO THREE METATARSAL WEIL OSTEOTOMY    Current Medications: Current Meds  Medication Sig  . acetaminophen (TYLENOL) 500 MG tablet Take 1,000 mg by mouth every 6 (six) hours as needed for pain.  Marland Kitchen. aspirin EC 81 MG tablet Take 1 tablet (81 mg total) by mouth daily.  . calcium-vitamin D (OSCAL WITH D) 500-200 MG-UNIT per tablet Take 1 tablet by mouth 2 (two) times daily.  Marland Kitchen. dicyclomine (BENTYL) 20 MG tablet Take 20 mg by mouth 4 (four) times daily as needed for spasms.   . Lactobacillus (ACIDOPHILUS PO) Take by mouth.  . loratadine (CLARITIN) 10 MG tablet Take 10 mg by mouth daily as needed for allergies.   . Melatonin 5 MG TABS Take by mouth at bedtime as needed.  . Multiple Vitamins-Minerals (MULTIVITAMIN  WITH MINERALS) tablet Take 1 tablet by mouth daily.   . Multiple Vitamins-Minerals (PRESERVISION AREDS 2) CAPS Take 1 tablet by mouth 2 (two) times daily.  . polyethylene glycol (MIRALAX / GLYCOLAX) packet Take 17 g by mouth every other day.   . traMADol (ULTRAM) 50 MG tablet Take by mouth every 12 (twelve) hours as needed for moderate pain.      Allergies:   Adhesive [tape]   Social History   Socioeconomic History  . Marital status: Widowed    Spouse name: None  . Number of children: None  . Years of education: None  . Highest education level: None  Social Needs  . Financial resource strain: None  . Food insecurity - worry: None  . Food insecurity - inability: None  . Transportation needs - medical: None  . Transportation needs - non-medical: None  Occupational History  . None  Tobacco Use  .  Smoking status: Never Smoker  . Smokeless tobacco: Never Used  Substance and Sexual Activity  . Alcohol use: Yes    Comment: social  . Drug use: No  . Sexual activity: No  Other Topics Concern  . None  Social History Narrative  . None     Family History: The patient's family history includes Heart attack in her brother; Heart disease in her maternal grandmother and mother. ROS:   Please see the history of present illness.     All other systems reviewed and are negative.  EKGs/Labs/Other Studies Reviewed:    The following studies were reviewed today:  Recent Labs: 11/01/2017: Creatinine, Ser 0.50  Recent Lipid Panel No results found for: CHOL, TRIG, HDL, CHOLHDL, VLDL, LDLCALC, LDLDIRECT  Physical Exam:    VS:  BP (!) 150/94 (BP Location: Right Arm, Patient Position: Sitting, Cuff Size: Normal)   Pulse 76   Ht 5\' 1"  (1.549 m)   Wt 150 lb (68 kg)   SpO2 99%   BMI 28.34 kg/m     Wt Readings from Last 3 Encounters:  02/14/18 150 lb (68 kg)  10/03/17 160 lb (72.6 kg)  09/14/16 158 lb 12.8 oz (72 kg)     GEN:  Well nourished, well developed in no acute  distress HEENT: Normal NECK: No JVD; Right carotid bruit LYMPHATICS: No lymphadenopathy CARDIAC: RRR, 2/6 murmurs, rubs, gallops RESPIRATORY:  Clear to auscultation without rales, wheezing or rhonchi  ABDOMEN: Soft, non-tender, non-distended MUSCULOSKELETAL:  No edema; No deformity  SKIN: Warm and dry NEUROLOGIC:  Alert and oriented x 3 PSYCHIATRIC:  Normal affect   TTE: 09/17/2016 - Left ventricle: The cavity size was normal. There was mild   concentric hypertrophy. Systolic function was normal. The   estimated ejection fraction was in the range of 60% to 65%. Wall   motion was normal; there were no regional wall motion   abnormalities. Doppler parameters are consistent with abnormal   left ventricular relaxation (grade 1 diastolic dysfunction).   Doppler parameters are consistent with high ventricular filling   pressure. - Aortic valve: Transvalvular velocity was within the normal range.   There was no stenosis. There was mild regurgitation. - Mitral valve: Transvalvular velocity was within the normal range.   There was no evidence for stenosis. There was no regurgitation. - Left atrium: The atrium was moderately dilated. - Right ventricle: The cavity size was normal. Wall thickness was   normal. Systolic function was normal. - Right atrium: The atrium was mildly dilated. - Atrial septum: No defect or patent foramen ovale was identified   by color flow Doppler. - Tricuspid valve: There was mild regurgitation. - Pulmonary arteries: Systolic pressure was within the normal   range. PA peak pressure: 33 mm Hg (S).  EKG performed today 10/03/2017 was personally reviewed and shows normal sinus rhythm with nonspecific T-wave abnormalities unchanged from prior.    ASSESSMENT:    1. DOE (dyspnea on exertion)   2. Essential hypertension    PLAN:    In order of problems listed above:  1. Dyspnea on exertion and significant family history of premature coronary artery disease,  coronary CTA showed normal coronaries, ca score of 1. Her lipids are within normal limit, at this point she doesn't need to be started on a statin. She is advised to exercise on a regular basis to increase her exercise tolerance.  2. Palpitations:  30 day monitor was normal and so were all the labs including TSH. Echocardiogram showed  normal LVEF with no regional wall motion abnormalities. Minimal palpitations.  3. Right Carotid Bruit:  bilateral carotid duplex showed less than 50% stenosis in the bilateral internal carotid arteries. Tortuosity results an elevated velocities. We will repeat in 2020.  4. Blood pressure elevated today however repeated 134/72, we will not start any blood pressure medication at this point.  Medication Adjustments/Labs and Tests Ordered: Current medicines are reviewed at length with the patient today.  Concerns regarding medicines are outlined above.  No orders of the defined types were placed in this encounter.  No orders of the defined types were placed in this encounter.   Signed, Tobias Alexander, MD  02/14/2018 8:45 AM    [facility]

## 2018-02-14 NOTE — Patient Instructions (Signed)

## 2018-03-03 DIAGNOSIS — I10 Essential (primary) hypertension: Secondary | ICD-10-CM | POA: Diagnosis not present

## 2018-03-03 DIAGNOSIS — K589 Irritable bowel syndrome without diarrhea: Secondary | ICD-10-CM | POA: Diagnosis not present

## 2018-03-03 DIAGNOSIS — E785 Hyperlipidemia, unspecified: Secondary | ICD-10-CM | POA: Diagnosis not present

## 2018-03-03 DIAGNOSIS — F419 Anxiety disorder, unspecified: Secondary | ICD-10-CM | POA: Diagnosis not present

## 2018-03-03 DIAGNOSIS — Z0000 Encounter for general adult medical examination without abnormal findings: Secondary | ICD-10-CM | POA: Diagnosis not present

## 2018-03-20 DIAGNOSIS — H43813 Vitreous degeneration, bilateral: Secondary | ICD-10-CM | POA: Diagnosis not present

## 2018-03-20 DIAGNOSIS — H59812 Chorioretinal scars after surgery for detachment, left eye: Secondary | ICD-10-CM | POA: Diagnosis not present

## 2018-03-20 DIAGNOSIS — H33322 Round hole, left eye: Secondary | ICD-10-CM | POA: Diagnosis not present

## 2018-03-20 DIAGNOSIS — H43811 Vitreous degeneration, right eye: Secondary | ICD-10-CM | POA: Diagnosis not present

## 2018-04-09 DIAGNOSIS — F419 Anxiety disorder, unspecified: Secondary | ICD-10-CM | POA: Diagnosis not present

## 2018-04-25 DIAGNOSIS — J029 Acute pharyngitis, unspecified: Secondary | ICD-10-CM | POA: Diagnosis not present

## 2018-04-25 DIAGNOSIS — R05 Cough: Secondary | ICD-10-CM | POA: Diagnosis not present

## 2018-04-28 ENCOUNTER — Other Ambulatory Visit: Payer: Self-pay | Admitting: Family Medicine

## 2018-04-28 DIAGNOSIS — Z1231 Encounter for screening mammogram for malignant neoplasm of breast: Secondary | ICD-10-CM

## 2018-04-29 DIAGNOSIS — J0101 Acute recurrent maxillary sinusitis: Secondary | ICD-10-CM | POA: Diagnosis not present

## 2018-05-20 ENCOUNTER — Ambulatory Visit
Admission: RE | Admit: 2018-05-20 | Discharge: 2018-05-20 | Disposition: A | Discharge status: Home / Self Care | Payer: PPO | Source: Ambulatory Visit | Attending: Family Medicine | Admitting: Family Medicine

## 2018-05-20 DIAGNOSIS — Z1231 Encounter for screening mammogram for malignant neoplasm of breast: Secondary | ICD-10-CM | POA: Diagnosis not present

## 2018-05-27 DIAGNOSIS — Z8719 Personal history of other diseases of the digestive system: Secondary | ICD-10-CM | POA: Diagnosis not present

## 2018-05-27 DIAGNOSIS — R197 Diarrhea, unspecified: Secondary | ICD-10-CM | POA: Diagnosis not present

## 2018-05-27 DIAGNOSIS — R634 Abnormal weight loss: Secondary | ICD-10-CM | POA: Diagnosis not present

## 2018-05-27 DIAGNOSIS — K5900 Constipation, unspecified: Secondary | ICD-10-CM | POA: Diagnosis not present

## 2018-05-27 DIAGNOSIS — R140 Abdominal distension (gaseous): Secondary | ICD-10-CM | POA: Diagnosis not present

## 2018-06-16 DIAGNOSIS — R197 Diarrhea, unspecified: Secondary | ICD-10-CM | POA: Diagnosis not present

## 2018-06-16 DIAGNOSIS — A049 Bacterial intestinal infection, unspecified: Secondary | ICD-10-CM | POA: Diagnosis not present

## 2018-06-16 DIAGNOSIS — K9089 Other intestinal malabsorption: Secondary | ICD-10-CM | POA: Diagnosis not present

## 2018-06-16 DIAGNOSIS — R140 Abdominal distension (gaseous): Secondary | ICD-10-CM | POA: Diagnosis not present

## 2018-06-16 DIAGNOSIS — R634 Abnormal weight loss: Secondary | ICD-10-CM | POA: Diagnosis not present

## 2018-08-05 DIAGNOSIS — R140 Abdominal distension (gaseous): Secondary | ICD-10-CM | POA: Diagnosis not present

## 2018-08-05 DIAGNOSIS — Z9889 Other specified postprocedural states: Secondary | ICD-10-CM | POA: Diagnosis not present

## 2018-08-05 DIAGNOSIS — A049 Bacterial intestinal infection, unspecified: Secondary | ICD-10-CM | POA: Diagnosis not present

## 2018-08-05 DIAGNOSIS — Z8719 Personal history of other diseases of the digestive system: Secondary | ICD-10-CM | POA: Diagnosis not present

## 2018-08-05 DIAGNOSIS — R197 Diarrhea, unspecified: Secondary | ICD-10-CM | POA: Diagnosis not present

## 2018-09-12 DIAGNOSIS — Z23 Encounter for immunization: Secondary | ICD-10-CM | POA: Diagnosis not present

## 2019-03-03 ENCOUNTER — Encounter: Payer: Self-pay | Admitting: *Deleted

## 2019-03-06 ENCOUNTER — Telehealth: Payer: Self-pay | Admitting: Cardiology

## 2019-03-06 NOTE — Telephone Encounter (Signed)
   Primary Cardiologist:  Dr. Delton See  Patient contacted.  History reviewed.  No symptoms to suggest any unstable cardiac conditions.  Based on discussion, with current pandemic situation, we will be postponing this appointment for [name] with a plan for f/u in 6-12 wks  if feasible/necessary.  If symptoms change, she has been instructed to contact our office.   Routing to C19 CANCEL pool for tracking (P CV DIV CV19 CANCEL - reason for visit "other.") and assigning priority (1 = 4-6 wks, 2 = 6-12 wks, 3 = >12 wks).   Loa Socks, LPN  5/[id number]/6387 5:64 PM     Pt states she is good on refills at this time.  Pt verbalized understanding and agrees with this plan.     Marland Kitchen

## 2019-03-06 NOTE — Telephone Encounter (Signed)
I have reviewed the patient's chart, unless she has new or worsening cardiac symptoms we will postpone her appointment for 6 to 12 weeks.  Tobias Alexander, MD

## 2019-03-12 ENCOUNTER — Ambulatory Visit: Payer: PPO | Admitting: Cardiology

## 2019-03-24 NOTE — Telephone Encounter (Signed)
Virtual Visit Pre-Appointment Phone Call   TELEPHONE CALL NOTE  Carol Roberts has been deemed a candidate for a follow-up tele-health visit to limit community exposure during the Covid-19 pandemic. I spoke with the patient via phone to ensure availability of phone/video source, confirm preferred email & phone number, and discuss instructions and expectations.  I reminded Carol Roberts to be prepared with any vital sign and/or heart rhythm information that could potentially be obtained via home monitoring, at the time of her visit. I reminded Carol Roberts to expect a phone call at the time of her visit if her visit.  Patient agrees to consent below.  Lattie Haw, RN 03/24/2019 4:15 PM   FULL LENGTH CONSENT FOR TELE-HEALTH VISIT   I hereby voluntarily request, consent and authorize [facility] and its employed or contracted physicians, physician assistants, nurse practitioners or other licensed health care professionals (the Practitioner), to provide me with telemedicine health care services (the "Services") as deemed necessary by the treating Practitioner. I acknowledge and consent to receive the Services by the Practitioner via telemedicine. I understand that the telemedicine visit will involve communicating with the Practitioner through live audiovisual communication technology and the disclosure of certain medical information by electronic transmission. I acknowledge that I have been given the opportunity to request an in-person assessment or other available alternative prior to the telemedicine visit and am voluntarily participating in the telemedicine visit.  I understand that I have the right to withhold or withdraw my consent to the use of telemedicine in the course of my care at any time, without affecting my right to future care or treatment, and that the Practitioner or I may terminate the telemedicine visit at any time. I understand that I have the right to inspect  all information obtained and/or recorded in the course of the telemedicine visit and may receive copies of available information for a reasonable fee.  I understand that some of the potential risks of receiving the Services via telemedicine include:  Marland Kitchen Delay or interruption in medical evaluation due to technological equipment failure or disruption; . Information transmitted may not be sufficient (e.g. poor resolution of images) to allow for appropriate medical decision making by the Practitioner; and/or  . In rare instances, security protocols could fail, causing a breach of personal health information.  Furthermore, I acknowledge that it is my responsibility to provide information about my medical history, conditions and care that is complete and accurate to the best of my ability. I acknowledge that Practitioner's advice, recommendations, and/or decision may be based on factors not within their control, such as incomplete or inaccurate data provided by me or distortions of diagnostic images or specimens that may result from electronic transmissions. I understand that the practice of medicine is not an exact science and that Practitioner makes no warranties or guarantees regarding treatment outcomes. I acknowledge that I will receive a copy of this consent concurrently upon execution via email to the email address I last provided but may also request a printed copy by calling the office of [facility].    I understand that my insurance will be billed for this visit.   I have read or had this consent read to me. . I understand the contents of this consent, which adequately explains the benefits and risks of the Services being provided via telemedicine.  . I have been provided ample opportunity to ask questions regarding this consent and the Services and have had my questions  answered to my satisfaction. . I give my informed consent for the services to be provided through the use of telemedicine in my  medical care  By participating in this telemedicine visit I agree to the above.

## 2019-03-31 DIAGNOSIS — R0989 Other specified symptoms and signs involving the circulatory and respiratory systems: Secondary | ICD-10-CM | POA: Insufficient documentation

## 2019-03-31 DIAGNOSIS — Z8249 Family history of ischemic heart disease and other diseases of the circulatory system: Secondary | ICD-10-CM | POA: Insufficient documentation

## 2019-03-31 DIAGNOSIS — R002 Palpitations: Secondary | ICD-10-CM | POA: Insufficient documentation

## 2019-03-31 NOTE — Progress Notes (Deleted)
{Choose 1 Note Type (Telehealth Visit or Telephone Visit):[phone]}   Evaluation Performed:  Follow-up visit  Date:  03/31/2019   ID:  Carol Roberts, DOB 1946-10-10, MRN [id number]  {Patient [phone]::"Home"} {Provider Location:[phone]}  PCP:  Shirlean MylarWebb, Carol, MD  Cardiologist:  No primary care provider on file. *** Electrophysiologist:  None   Chief Complaint:  ***  History of Present Illness:    Carol Roberts is a 73 y.o. female with history of tachypalpitations and family history of CAD.  Coronary CTA 10/2017 showed normal coronary arteries and calcium score of 1, right carotid bruit with less than 50% stenosis bilaterally with tortuosity resulting in elevated velocities.  No hyperlipidemia.  Patient last saw Dr. Delton SeeNelson 02/2018 complaining of dyspnea on exertion and was advised to exercise on a regular basis to increase her exercise tolerance.  Previous 30-day monitor in 2017 was normal as were all labs including TSH.  The patient {does/does not:200015} have symptoms concerning for COVID-19 infection (fever, chills, cough, or new shortness of breath).    Past Medical History:  Diagnosis Date  . Anemia    "off and on"  . Arthritis    knees, hands, hips - hx of dislocated hip many yrs ago   . Fibromyalgia   . GERD (gastroesophageal reflux disease)   . H/O hiatal hernia    paraesophageal hernia, had surgery to repair  . Headache(784.0)    ocas migraines  . Paraesophageal hiatal hernia    causes vomiting, chest and left shoulder pain, sometimes left jaw pain   Past Surgical History:  Procedure Laterality Date  . 2011 RIGHT KNEE ARTHROSCOPY    . APPENDECTOMY    . BREAST EXCISIONAL BIOPSY Left 1987  . BUNIONECTOMY Right 06/14/2016   Procedure: RIGHT LAPIDUS AND MODIFIED MCBRIDE BUNIONECTOMY;  Surgeon: Toni ArthursJohn Hewitt, MD;  Location: [facility];  Service: Orthopedics;  Laterality: Right;  RIGHT LAPIDUS AND MODIFIED MCBRIDE BUNIONECTOMY  . COLON  SURGERY     bowel resection for diverticulitis  . COLOSTOMY CLOSURE  2010  . ESOPHAGEAL MANOMETRY N/A 06/08/2013   Procedure: ESOPHAGEAL MANOMETRY (EM);  Surgeon: Graylin ShiverSalem F Ganem, MD;  Location: [facility];  Service: Endoscopy;  Laterality: N/A;  Dr. Bosie ClosSchooler will read Manometry for Dr. Evette CristalGanem  . HAMMER TOE SURGERY Right 06/14/2016   Procedure: RIGHT TWO TO THREE HAMMER TOE CORRECTION;  Surgeon: Toni ArthursJohn Hewitt, MD;  Location: [facility];  Service: Orthopedics;  Laterality: Right;  RIGHT TWO TO THREE HAMMER TOE CORRECTION  . HERNIA REPAIR    . HIATAL HERNIA REPAIR  2005   [place]  . HIATAL HERNIA REPAIR  2007   Lap with MTF bridge mesh.  Dr Daphine DeutscherMartin,   . LAPAROSCOPIC INCISIONAL / UMBILICAL / VENTRAL HERNIA REPAIR  11/2010   lap VWH with mesh  . LEFT COLECTOMY  2010   with colostomy for perforated diverticulitis  . TENDON TRANSFER Right 06/14/2016   Procedure: FLEXOR TO EXTENSOR TRANSFER;  Surgeon: Toni ArthursJohn Hewitt, MD;  Location: [facility];  Service: Orthopedics;  Laterality: Right;  FLEXOR TO EXTENSOR TRANSFER  . VENTRAL HERNIA REPAIR  09/06/2010  . WEIL OSTEOTOMY Right 06/14/2016   Procedure: TWO TO THREE METATARSAL WEIL OSTEOTOMY;  Surgeon: Toni ArthursJohn Hewitt, MD;  Location: [facility];  Service: Orthopedics;  Laterality: Right;  TWO TO THREE METATARSAL WEIL OSTEOTOMY     No outpatient medications have been marked as taking for the 04/01/19 encounter (Appointment) with Dyann KiefLenze, Syniah Berne M, PA-C.  Allergies:   Adhesive [tape]   Social History   Tobacco Use  . Smoking status: Never Smoker  . Smokeless tobacco: Never Used  Substance Use Topics  . Alcohol use: Yes    Comment: social  . Drug use: No     Family Hx: The patient's family history includes Heart attack in her brother; Heart disease in her maternal grandmother and mother.  ROS:   Please see the history of present illness.    *** All other systems reviewed and are negative.   Prior CV  studies:   The following studies were reviewed today:  Coronary CTA 11/2018IMPRESSION: 1) Calcium score 1 which is 43 rd percentile for age and sex   2) No obstructive CAD see description above right dominant   3) Normal aortic root 3.1 cm   4) Mildly dilated main pulmonary artery 2.9 cm   Charlton Haws   Electronically Signed: By: Charlton Haws M.D. On: 11/01/2017 15:23   TTE: 09/17/2016 - Left ventricle: The cavity size was normal. There was mild   concentric hypertrophy. Systolic function was normal. The   estimated ejection fraction was in the range of 60% to 65%. Wall   motion was normal; there were no regional wall motion   abnormalities. Doppler parameters are consistent with abnormal   left ventricular relaxation (grade 1 diastolic dysfunction).   Doppler parameters are consistent with high ventricular filling   pressure. - Aortic valve: Transvalvular velocity was within the normal range.   There was no stenosis. There was mild regurgitation. - Mitral valve: Transvalvular velocity was within the normal range.   There was no evidence for stenosis. There was no regurgitation. - Left atrium: The atrium was moderately dilated. - Right ventricle: The cavity size was normal. Wall thickness was   normal. Systolic function was normal. - Right atrium: The atrium was mildly dilated. - Atrial septum: No defect or patent foramen ovale was identified   by color flow Doppler. - Tricuspid valve: There was mild regurgitation. - Pulmonary arteries: Systolic pressure was within the normal   range. PA peak pressure: 33 mm Hg (S).   Carotid Dopplers 2017IMPRESSION: Less than 50% stenosis in the bilateral internal carotid arteries. Tortuosity results an elevated velocities.     Electronically Signed   By: Jolaine Click M.D.   On: 09/18/2016 09:42     Labs/Other Tests and Data Reviewed:    EKG:  An ECG dated 10/03/17 was personally reviewed today and demonstrated:  Normal sinus  rhythm, no acute change  Recent Labs: No results found for requested labs within last 8760 hours.   Recent Lipid Panel No results found for: CHOL, TRIG, HDL, CHOLHDL, LDLCALC, LDLDIRECT  Wt Readings from Last 3 Encounters:  02/14/18 150 lb (68 kg)  10/03/17 160 lb (72.6 kg)  09/14/16 158 lb 12.8 oz (72 kg)     Objective:    Vital Signs:  There were no vitals taken for this visit.   {[facility] Virtual Exam (Optional):[phone]::"VITAL SIGNS:  reviewed"}  ASSESSMENT & PLAN:    1. History of tachypalpitations with previous 30-day monitor showing no arrhythmias 2. Family history of early CAD 3. Right carotid bruit with less than 50% stenosis in 2017 she is due for carotid Dopplers this year  COVID-19 Education: The signs and symptoms of COVID-19 were discussed with the patient and how to seek care for testing (follow up with PCP or arrange E-visit).  ***The importance of social distancing was discussed today.  Time:  Today, I have spent *** minutes with the patient with telehealth technology discussing the above problems.     Medication Adjustments/Labs and Tests Ordered: Current medicines are reviewed at length with the patient today.  Concerns regarding medicines are outlined above.   Tests Ordered: No orders of the defined types were placed in this encounter.   Medication Changes: No orders of the defined types were placed in this encounter.   Disposition:  Follow up {follow up:15908}  Signed, Jacolyn Reedy, PA-C  03/31/2019 2:51 PM    [facility]

## 2019-04-01 ENCOUNTER — Telehealth: Payer: PPO | Admitting: Physician Assistant

## 2019-04-02 ENCOUNTER — Ambulatory Visit: Payer: PPO | Admitting: Cardiology

## 2019-04-06 DIAGNOSIS — Z0000 Encounter for general adult medical examination without abnormal findings: Secondary | ICD-10-CM | POA: Diagnosis not present

## 2019-04-06 DIAGNOSIS — I10 Essential (primary) hypertension: Secondary | ICD-10-CM | POA: Diagnosis not present

## 2019-04-06 DIAGNOSIS — E785 Hyperlipidemia, unspecified: Secondary | ICD-10-CM | POA: Diagnosis not present

## 2019-04-06 DIAGNOSIS — K6389 Other specified diseases of intestine: Secondary | ICD-10-CM | POA: Diagnosis not present

## 2019-04-06 DIAGNOSIS — R0609 Other forms of dyspnea: Secondary | ICD-10-CM | POA: Insufficient documentation

## 2019-04-06 DIAGNOSIS — Z1211 Encounter for screening for malignant neoplasm of colon: Secondary | ICD-10-CM | POA: Diagnosis not present

## 2019-04-06 NOTE — Progress Notes (Signed)
Virtual Visit via Video Note   This visit type was conducted due to national recommendations for restrictions regarding the COVID-19 Pandemic (e.g. social distancing) in an effort to limit this patient's exposure and mitigate transmission in our community.  Due to her co-morbid illnesses, this patient is at least at moderate risk for complications without adequate follow up.  This format is felt to be most appropriate for this patient at this time.  All issues noted in this document were discussed and addressed.  A limited physical exam was performed with this format.  Please refer to the patient's chart for her consent to telehealth for [facility].   Evaluation Performed:  Follow-up visit  Date:  04/07/2019   ID:  Carol Roberts, DOB Apr 17, 1946, MRN [id number]  Patient Location: Home Provider Location: Home  PCP:  Shirlean MylarWebb, Carol, MD  Cardiologist:  Tobias AlexanderKatarina Nelson, MD   Electrophysiologist:  None   Chief Complaint:  High BP  History of Present Illness:    Carol Roberts is a 73 y.o. female with family history of early CAD, coronary CTA 10/2017 calcium score 1 and normal coronaries.Also history of tachypalpitations with no arrhythmias on 30  Day monitor, normal lipids not on statin. Right carotid bruit with < 50% bilateral internal carotid arteries.  Last saw Dr. Delton SeeNelson 02/2018-BP up initially but came down on recheck.  Patient has been running high and seeing Dr. Hyman HopesWebb on Thurs. Sometimes gets extra salt in her diet with chips and popcorn. Walks<30 min/day. No chest pain, shortness of breath. Has had some palpitations recently- worst episode lasted 5-10 min relieved with deep breathing and walking around. Drinking more caffeine since working from home 2-3 cups/day. 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   The patient does not have symptoms concerning for COVID-19 infection (fever, chills, cough, or new shortness of breath).    Past Medical History:  Diagnosis Date  . Anemia    "off and on"   . Arthritis    knees, hands, hips - hx of dislocated hip many yrs ago   . Fibromyalgia   . GERD (gastroesophageal reflux disease)   . H/O hiatal hernia    paraesophageal hernia, had surgery to repair  . Headache(784.0)    ocas migraines  . Paraesophageal hiatal hernia    causes vomiting, chest and left shoulder pain, sometimes left jaw pain   Past Surgical History:  Procedure Laterality Date  . 2011 RIGHT KNEE ARTHROSCOPY    . APPENDECTOMY    . BREAST EXCISIONAL BIOPSY Left 1987  . BUNIONECTOMY Right 06/14/2016   Procedure: RIGHT LAPIDUS AND MODIFIED MCBRIDE BUNIONECTOMY;  Surgeon: Toni ArthursJohn Hewitt, MD;  Location: [facility];  Service: Orthopedics;  Laterality: Right;  RIGHT LAPIDUS AND MODIFIED MCBRIDE BUNIONECTOMY  . COLON SURGERY     bowel resection for diverticulitis  . COLOSTOMY CLOSURE  2010  . ESOPHAGEAL MANOMETRY N/A 06/08/2013   Procedure: ESOPHAGEAL MANOMETRY (EM);  Surgeon: Graylin ShiverSalem F Ganem, MD;  Location: [facility];  Service: Endoscopy;  Laterality: N/A;  Dr. Bosie ClosSchooler will read Manometry for Dr. Evette CristalGanem  . HAMMER TOE SURGERY Right 06/14/2016   Procedure: RIGHT TWO TO THREE HAMMER TOE CORRECTION;  Surgeon: Toni ArthursJohn Hewitt, MD;  Location: [facility];  Service: Orthopedics;  Laterality: Right;  RIGHT TWO TO THREE HAMMER TOE CORRECTION  . HERNIA REPAIR    . HIATAL HERNIA REPAIR  2005   [place]  . HIATAL HERNIA REPAIR  2007   Lap with MTF bridge mesh.  Dr Daphine Deutscher,   . LAPAROSCOPIC INCISIONAL / UMBILICAL / VENTRAL HERNIA REPAIR  11/2010   lap VWH with mesh  . LEFT COLECTOMY  2010   with colostomy for perforated diverticulitis  . TENDON TRANSFER Right 06/14/2016   Procedure: FLEXOR TO EXTENSOR TRANSFER;  Surgeon: Toni Arthurs, MD;  Location: [facility];  Service: Orthopedics;  Laterality: Right;  FLEXOR TO EXTENSOR TRANSFER  . VENTRAL HERNIA REPAIR  09/06/2010  . WEIL OSTEOTOMY Right 06/14/2016   Procedure: TWO TO THREE METATARSAL WEIL OSTEOTOMY;   Surgeon: Toni Arthurs, MD;  Location: [facility];  Service: Orthopedics;  Laterality: Right;  TWO TO THREE METATARSAL WEIL OSTEOTOMY     Current Meds  Medication Sig  . acetaminophen (TYLENOL) 500 MG tablet Take 1,000 mg by mouth every 6 (six) hours as needed for pain.  Marland Kitchen aspirin EC 81 MG tablet Take 1 tablet (81 mg total) by mouth daily.  . calcium-vitamin D (OSCAL WITH D) 500-200 MG-UNIT per tablet Take 1 tablet by mouth 2 (two) times daily.  Marland Kitchen dicyclomine (BENTYL) 20 MG tablet Take 20 mg by mouth 4 (four) times daily as needed for spasms.   . Lactobacillus (ACIDOPHILUS PO) Take by mouth.  . loratadine (CLARITIN) 10 MG tablet Take 10 mg by mouth daily as needed for allergies.   . Melatonin 5 MG TABS Take by mouth at bedtime as needed.  . Multiple Vitamins-Minerals (MULTIVITAMIN WITH MINERALS) tablet Take 1 tablet by mouth daily.   . Multiple Vitamins-Minerals (PRESERVISION AREDS 2) CAPS Take 1 tablet by mouth 2 (two) times daily.  . polyethylene glycol (MIRALAX / GLYCOLAX) packet Take 17 g by mouth every other day.   . traMADol (ULTRAM) 50 MG tablet Take by mouth every 12 (twelve) hours as needed for moderate pain.      Allergies:   Adhesive [tape]   Social History   Tobacco Use  . Smoking status: Never Smoker  . Smokeless tobacco: Never Used  Substance Use Topics  . Alcohol use: Yes    Comment: social  . Drug use: No     Family Hx: The patient's family history includes Heart attack in her brother; Heart disease in her maternal grandmother and mother.  ROS:   Please see the history of present illness.    Review of Systems  Constitution: Negative.  HENT: Negative.   Eyes: Negative.   Cardiovascular: Negative.   Respiratory: Negative.   Hematologic/Lymphatic: Negative.   Musculoskeletal: Negative.  Negative for joint pain.  Gastrointestinal: Negative.   Genitourinary: Negative.   Neurological: Negative.     All other systems reviewed and are negative.    Prior CV studies:   The following studies were reviewed today:   TTE: 09/17/2016 - Left ventricle: The cavity size was normal. There was mild   concentric hypertrophy. Systolic function was normal. The   estimated ejection fraction was in the range of 60% to 65%. Wall   motion was normal; there were no regional wall motion   abnormalities. Doppler parameters are consistent with abnormal   left ventricular relaxation (grade 1 diastolic dysfunction).   Doppler parameters are consistent with high ventricular filling   pressure. - Aortic valve: Transvalvular velocity was within the normal range.   There was no stenosis. There was mild regurgitation. - Mitral valve: Transvalvular velocity was within the normal range.   There was no evidence for stenosis. There was no regurgitation. - Left atrium: The atrium was moderately dilated. - Right ventricle: The  cavity size was normal. Wall thickness was   normal. Systolic function was normal. - Right atrium: The atrium was mildly dilated. - Atrial septum: No defect or patent foramen ovale was identified   by color flow Doppler. - Tricuspid valve: There was mild regurgitation. - Pulmonary arteries: Systolic pressure was within the normal   range. PA peak pressure: 33 mm Hg (S).   Coronary CTA 11/2018FINDINGS: Non-cardiac: See separate report from [facility]. No significant findings on limited lung and soft tissue windows.   Calcium Score: Very tiny area of punctate calcium seen in proximal LAD and proximal RCA   Coronary Arteries: Right dominant with no anomalies   LM: Normal   LAD:  Less than 30% calcified plaque in proximal LAD   D1:  Normal   D2: Normal   Circumflex:  Normal   OM1:  Small and normal   OM2:  Normal   OM3:  Normal   RCA:  Less than 20% calcified plaque in proximal vessel   PDA: Normal   PLA:  Normal   IMPRESSION: 1) Calcium score 1 which is 43 rd percentile for age and sex   2) No  obstructive CAD see description above right dominant   3) Normal aortic root 3.1 cm   4) Mildly dilated main pulmonary artery 2.9 cm   Charlton Haws   Electronically Signed: By: Charlton Haws M.D. On: 11/01/2017 15:23   Carotid dopplers 2017 IMPRESSION: Less than 50% stenosis in the bilateral internal carotid arteries. Tortuosity results an elevated velocities.     Electronically Signed   By: Jolaine Click M.D.   On: 09/18/2016 09:42      Labs/Other Tests and Data Reviewed:    EKG:  An ECG dated 10/03/17 NSR 71/m Nonspecific ST changes was personally reviewed today and demonstrated:     Recent Labs: No results found for requested labs within last 8760 hours.   Recent Lipid Panel No results found for: CHOL, TRIG, HDL, CHOLHDL, LDLCALC, LDLDIRECT  Wt Readings from Last 3 Encounters:  04/07/19 158 lb (71.7 kg)  02/14/18 150 lb (68 kg)  10/03/17 160 lb (72.6 kg)     Objective:    Vital Signs:  BP (!) 151/103   Ht  (1.549 m)   Wt 158 lb (71.7 kg)   BMI 29.85 kg/m    VITAL SIGNS:  reviewed GEN:  no acute distress RESPIRATORY:  normal respiratory effort, symmetric expansion CARDIOVASCULAR:  no peripheral edema  ASSESSMENT & PLAN:    1. History of dyspnea on exertion with Normal Coronary CTA 2018 2. Family history of early CAD 3. Right carotid bruit-repeat dopplers in 6 months. 4. History of palpitations 5. Essential HTN- begin Metorpolol XL 25 mg daily, 2 gm sodium diet, decrease caffeine  COVID-19 Education: The signs and symptoms of COVID-19 were discussed with the patient and how to seek care for testing (follow up with PCP or arrange E-visit).  The importance of social distancing was discussed today.  Time:   Today, I have spent 13:30 minutes with the patient with telehealth technology discussing the above problems.     Medication Adjustments/Labs and Tests Ordered: Current medicines are reviewed at length with the patient today.  Concerns  regarding medicines are outlined above.   Tests Ordered: No orders of the defined types were placed in this encounter.   Medication Changes: No orders of the defined types were placed in this encounter.   Disposition:  Follow up in 2 week(s) myself  Signed, Jacolyn Reedy, PA-C  04/07/2019 1:18 PM    [facility]

## 2019-04-07 ENCOUNTER — Encounter: Payer: Self-pay | Admitting: Physician Assistant

## 2019-04-07 ENCOUNTER — Telehealth (INDEPENDENT_AMBULATORY_CARE_PROVIDER_SITE_OTHER): Payer: PPO | Admitting: Physician Assistant

## 2019-04-07 ENCOUNTER — Other Ambulatory Visit: Payer: Self-pay

## 2019-04-07 VITALS — BP 151/103 | Ht 61.0 in | Wt 158.0 lb

## 2019-04-07 DIAGNOSIS — I10 Essential (primary) hypertension: Secondary | ICD-10-CM | POA: Diagnosis not present

## 2019-04-07 DIAGNOSIS — Z8249 Family history of ischemic heart disease and other diseases of the circulatory system: Secondary | ICD-10-CM

## 2019-04-07 DIAGNOSIS — R0989 Other specified symptoms and signs involving the circulatory and respiratory systems: Secondary | ICD-10-CM

## 2019-04-07 DIAGNOSIS — R0609 Other forms of dyspnea: Secondary | ICD-10-CM

## 2019-04-07 MED ORDER — METOPROLOL SUCCINATE ER 25 MG PO TB24: 25.0000 mg | ORAL_TABLET | Freq: Every day | ORAL | 3 refills | Status: DC

## 2019-04-07 NOTE — Patient Instructions (Addendum)
Medication Instructions:  Your physician has recommended you make the following change in your medication:   START: metoprolol succinate (Toprol) 25 mg once a day  Lab work: None Ordered  If you have labs (blood work) drawn today and your tests are completely normal, you will receive your results only by: Marland Kitchen MyChart Message (if you have MyChart) OR . A paper copy in the mail If you have any lab test that is abnormal or we need to change your treatment, we will call you to review the results.  Testing/Procedures: Your physician has requested that you have a carotid duplex in 6 months. This test is an ultrasound of the carotid arteries in your neck. It looks at blood flow through these arteries that supply the brain with blood. Allow one hour for this exam. There are no restrictions or special instructions.   Follow-Up: . Follow up with Jacolyn Reedy, PA via VIDEO Visit on 04/21/19 at 2:00 PM  Any Other Special Instructions Will Be Listed Below (If Applicable).  1. Decrease the amount of caffeine in your diet  2. Decrease the amount of salt in your diet to less than 2,00 mg of sodium daily  Two Gram Sodium Diet 2000 mg  What is Sodium? Sodium is a mineral found naturally in many foods. The most significant source of sodium in the diet is table salt, which is about 40% sodium.  Processed, convenience, and preserved foods also contain a large amount of sodium.  The body needs only 500 mg of sodium daily to function,  A normal diet provides more than enough sodium even if you do not use salt.  Why Limit Sodium? A build up of sodium in the body can cause thirst, increased blood pressure, shortness of breath, and water retention.  Decreasing sodium in the diet can reduce edema and risk of heart attack or stroke associated with high blood pressure.  Keep in mind that there are many other factors involved in these health problems.  Heredity, obesity, lack of exercise, cigarette smoking, stress and  what you eat all play a role.  General Guidelines:  Do not add salt at the table or in cooking.  One teaspoon of salt contains over 2 grams of sodium.  Read food labels  Avoid processed and convenience foods  Ask your dietitian before eating any foods not dicussed in the menu planning guidelines  Consult your physician if you wish to use a salt substitute or a sodium containing medication such as antacids.  Limit milk and milk products to 16 oz (2 cups) per day.  Shopping Hints:  READ LABELS!! "Dietetic" does not necessarily mean low sodium.  Salt and other sodium ingredients are often added to foods during processing.   Menu Planning Guidelines Food Group Choose More Often Avoid  Beverages (see also the milk group All fruit juices, low-sodium, salt-free vegetables juices, low-sodium carbonated beverages Regular vegetable or tomato juices, commercially softened water used for drinking or cooking  Breads and Cereals Enriched white, wheat, rye and pumpernickel bread, hard rolls and dinner rolls; muffins, cornbread and waffles; most dry cereals, cooked cereal without added salt; unsalted crackers and breadsticks; low sodium or homemade bread crumbs Bread, rolls and crackers with salted tops; quick breads; instant hot cereals; pancakes; commercial bread stuffing; self-rising flower and biscuit mixes; regular bread crumbs or cracker crumbs  Desserts and Sweets Desserts and sweets mad with mild should be within allowance Instant pudding mixes and cake mixes  Fats Butter or margarine; vegetable  oils; unsalted salad dressings, regular salad dressings limited to 1 Tbs; light, sour and heavy cream Regular salad dressings containing bacon fat, bacon bits, and salt pork; snack dips made with instant soup mixes or processed cheese; salted nuts  Fruits Most fresh, frozen and canned fruits Fruits processed with salt or sodium-containing ingredient (some dried fruits are processed with sodium sulfites         Vegetables Fresh, frozen vegetables and low- sodium canned vegetables Regular canned vegetables, sauerkraut, pickled vegetables, and others prepared in brine; frozen vegetables in sauces; vegetables seasoned with ham, bacon or salt pork  Condiments, Sauces, Miscellaneous  Salt substitute with physician's approval; pepper, herbs, spices; vinegar, lemon or lime juice; hot pepper sauce; garlic powder, onion powder, low sodium soy sauce (1 Tbs.); low sodium condiments (ketchup, chili sauce, mustard) in limited amounts (1 tsp.) fresh ground horseradish; unsalted tortilla chips, pretzels, potato chips, popcorn, salsa (1/4 cup) Any seasoning made with salt including garlic salt, celery salt, onion salt, and seasoned salt; sea salt, rock salt, kosher salt; meat tenderizers; monosodium glutamate; mustard, regular soy sauce, barbecue, sauce, chili sauce, teriyaki sauce, steak sauce, Worcestershire sauce, and most flavored vinegars; canned gravy and mixes; regular condiments; salted snack foods, olives, picles, relish, horseradish sauce, catsup   Food preparation: Try these seasonings Meats:    Pork Sage, onion Serve with applesauce  Chicken Poultry seasoning, thyme, parsley Serve with cranberry sauce  Lamb Curry powder, rosemary, garlic, thyme Serve with mint sauce or jelly  Veal Marjoram, basil Serve with current jelly, cranberry sauce  Beef Pepper, bay leaf Serve with dry mustard, unsalted chive butter  Fish Bay leaf, dill Serve with unsalted lemon butter, unsalted parsley butter  Vegetables:    Asparagus Lemon juice   Broccoli Lemon juice   Carrots Mustard dressing parsley, mint, nutmeg, glazed with unsalted butter and sugar   Green beans Marjoram, lemon juice, nutmeg,dill seed   Tomatoes Basil, marjoram, onion   Spice /blend for [employer]"Salt Shaker" 4 tsp ground thyme 1 tsp ground sage 3 tsp ground rosemary 4 tsp ground marjoram   Test your knowledge 1. A product that says "Salt Free" may  still contain sodium. True or False 2. Garlic Powder and Hot Pepper Sauce an be used as alternative seasonings.True or False 3. Processed foods have more sodium than fresh foods.  True or False 4. Canned Vegetables have less sodium than froze True or False  WAYS TO DECREASE YOUR SODIUM INTAKE 1. Avoid the use of added salt in cooking and at the table.  Table salt (and other prepared seasonings which contain salt) is probably one of the greatest sources of sodium in the diet.  Unsalted foods can gain flavor from the sweet, sour, and butter taste sensations of herbs and spices.  Instead of using salt for seasoning, try the following seasonings with the foods listed.  Remember: how you use them to enhance natural food flavors is limited only by your creativity... Allspice-Meat, fish, eggs, fruit, peas, red and yellow vegetables Almond Extract-Fruit baked goods Anise Seed-Sweet breads, fruit, carrots, beets, cottage cheese, cookies (tastes like licorice) Basil-Meat, fish, eggs, vegetables, rice, vegetables salads, soups, sauces Bay Leaf-Meat, fish, stews, poultry Burnet-Salad, vegetables (cucumber-like flavor) Caraway Seed-Bread, cookies, cottage cheese, meat, vegetables, cheese, rice Cardamon-Baked goods, fruit, soups Celery Powder or seed-Salads, salad dressings, sauces, meatloaf, soup, bread.Do not use  celery salt Chervil-Meats, salads, fish, eggs, vegetables, cottage cheese (parsley-like flavor) Chili Power-Meatloaf, chicken cheese, corn, eggplant, egg dishes Chives-Salads cottage cheese, egg dishes,  soups, vegetables, sauces Cilantro-Salsa, casseroles Cinnamon-Baked goods, fruit, pork, lamb, chicken, carrots Cloves-Fruit, baked goods, fish, pot roast, green beans, beets, carrots Coriander-Pastry, cookies, meat, salads, cheese (lemon-orange flavor) Cumin-Meatloaf, fish,cheese, eggs, cabbage,fruit pie (caraway flavor) [employer], fruit, eggs, fish, poultry, cottage cheese,  vegetables Dill Seed-Meat, cottage cheese, poultry, vegetables, fish, salads, bread Fennel Seed-Bread, cookies, apples, pork, eggs, fish, beets, cabbage, cheese, Licorice-like flavor Garlic-(buds or powder) Salads, meat, poultry, fish, bread, butter, vegetables, potatoes.Do not  use garlic salt Ginger-Fruit, vegetables, baked goods, meat, fish, poultry Horseradish Root-Meet, vegetables, butter Lemon Juice or Extract-Vegetables, fruit, tea, baked goods, fish salads Mace-Baked goods fruit, vegetables, fish, poultry (taste like nutmeg) Maple Extract-Syrups Marjoram-Meat, chicken, fish, vegetables, breads, green salads (taste like Sage) Mint-Tea, lamb, sherbet, vegetables, desserts, carrots, cabbage Mustard, Dry or Seed-Cheese, eggs, meats, vegetables, poultry Nutmeg-Baked goods, fruit, chicken, eggs, vegetables, desserts Onion Powder-Meat, fish, poultry, vegetables, cheese, eggs, bread, rice salads (Do not use   Onion salt) Orange Extract-Desserts, baked goods Oregano-Pasta, eggs, cheese, onions, pork, lamb, fish, chicken, vegetables, green salads Paprika-Meat, fish, poultry, eggs, cheese, vegetables Parsley Flakes-Butter, vegetables, meat fish, poultry, eggs, bread, salads (certain forms may   Contain sodium Pepper-Meat fish, poultry, vegetables, eggs Peppermint Extract-Desserts, baked goods Poppy Seed-Eggs, bread, cheese, fruit dressings, baked goods, noodles, vegetables, cottage  [employer], poultry, meat, fish, cauliflower, turnips,eggs bread Saffron-Rice, bread, veal, chicken, fish, eggs Sage-Meat, fish, poultry, onions, eggplant, tomateos, pork, stews Savory-Eggs, salads, poultry, meat, rice, vegetables, soups, pork Tarragon-Meat, poultry, fish, eggs, butter, vegetables (licorice-like flavor)  Thyme-Meat, poultry, fish, eggs, vegetables, (clover-like flavor), sauces, soups Tumeric-Salads, butter, eggs, fish, rice, vegetables (saffron-like  flavor) Vanilla Extract-Baked goods, candy Vinegar-Salads, vegetables, meat marinades Walnut Extract-baked goods, candy  2. Choose your Foods Wisely   The following is a list of foods to avoid which are high in sodium:  Meats-Avoid all smoked, canned, salt cured, dried and kosher meat and fish as well as Anchovies   [employer] meats:Bologna, Liverwurst, Pastrami Canned meat or fish  Marinated herring Caviar    Pepperoni Corned Beef   Pizza Dried chipped beef  Salami Frozen breaded fish or meat Salt pork Frankfurters or hot dogs  Sardines Gefilte fish   Sausage Ham (boiled ham, Proscuitto Smoked butt    spiced ham)   Spam      TV Dinners Vegetables Canned vegetables (Regular) Relish Canned mushrooms  Sauerkraut Olives    Tomato juice Pickles  Bakery and Dessert Products Canned puddings  Cream pies Cheesecake   Decorated cakes Cookies  Beverages/Juices Tomato juice, regular  Gatorade   V-8 vegetable juice, regular  Breads and Cereals Biscuit mixes   Salted potato chips, corn chips, pretzels Bread stuffing mixes  Salted crackers and rolls Pancake and waffle mixes Self-rising flour  Seasonings Accent    Meat sauces Barbecue sauce  Meat tenderizer Catsup    Monosodium glutamate (MSG) Celery salt   Onion salt Chili sauce   Prepared mustard Garlic salt   Salt, seasoned salt, sea salt Gravy mixes   Soy sauce Horseradish   Steak sauce Ketchup   Tartar sauce Lite salt    Teriyaki sauce Marinade mixes   Worcestershire sauce  Others Baking powder   Cocoa and cocoa mixes Baking soda   Commercial casserole mixes Candy-caramels, chocolate  Dehydrated soups    Bars, fudge,nougats  Instant rice and pasta mixes Canned broth or soup  Maraschino cherries Cheese, aged and processed cheese and cheese spreads  Learning Assessment Quiz  Indicated T (for True) or F (for False) for each of the following statements:  1. _____ Fresh fruits and vegetables and  unprocessed grains are generally low in sodium 2. _____ Water may contain a considerable amount of sodium, depending on the source 3. _____ You can always tell if a food is high in sodium by tasting it 4. _____ Certain laxatives my be high in sodium and should be avoided unless prescribed   by a physician or pharmacist 5. _____ Salt substitutes may be used freely by anyone on a sodium restricted diet 6. _____ Sodium is present in table salt, food additives and as a natural component of   most foods 7. _____ Table salt is approximately 90% sodium 8. _____ Limiting sodium intake may help prevent excess fluid accumulation in the body 9. _____ On a sodium-restricted diet, seasonings such as bouillon soy sauce, and    cooking wine should be used in place of table salt 10. _____ On an ingredient list, a product which lists monosodium glutamate as the first   ingredient is an appropriate food to include on a low sodium diet  Circle the best answer(s) to the following statements (Hint: there may be more than one correct answer)  11. On a low-sodium diet, some acceptable snack items are:    A. Olives  F. Bean dip   K. Grapefruit juice    B. Salted Pretzels G. Commercial Popcorn   L. Canned peaches    C. Carrot Sticks  H. Bouillon   M. Unsalted nuts   D. [place] fries  I. Peanut butter crackers N. Salami   E. Sweet pickles J. Tomato Juice   O. Pizza  12.  Seasonings that may be used freely on a reduced - sodium diet include   A. Lemon wedges F.Monosodium glutamate K. Celery seed    B.Soysauce   G. Pepper   L. Mustard powder   C. Sea salt  H. Cooking wine  M. Onion flakes   D. Vinegar  E. Prepared horseradish N. Salsa   E. Sage   J. Worcestershire sauce  O. Chutney

## 2019-04-09 DIAGNOSIS — E785 Hyperlipidemia, unspecified: Secondary | ICD-10-CM | POA: Diagnosis not present

## 2019-04-09 DIAGNOSIS — I10 Essential (primary) hypertension: Secondary | ICD-10-CM | POA: Diagnosis not present

## 2019-04-21 ENCOUNTER — Telehealth: Payer: PPO | Admitting: Physician Assistant

## 2019-04-21 MED ORDER — METOPROLOL SUCCINATE ER 50 MG PO TB24: 50.0000 mg | ORAL_TABLET | Freq: Every day | ORAL | 3 refills | Status: DC

## 2019-04-28 NOTE — Progress Notes (Signed)
Virtual Visit via Video Note   This visit type was conducted due to national recommendations for restrictions regarding the COVID-19 Pandemic (e.g. social distancing) in an effort to limit this patient's exposure and mitigate transmission in our community.  Due to her co-morbid illnesses, this patient is at least at moderate risk for complications without adequate follow up.  This format is felt to be most appropriate for this patient at this time.  All issues noted in this document were discussed and addressed.  A limited physical exam was performed with this format.  Please refer to the patient's chart for her consent to telehealth for [facility].   Patient had to convert to phone visit because she couldn't get her video to work.  Date:  04/29/2019   ID:  EEVEE Roberts, DOB 04/02/1946, MRN [id number]  Patient Location: Other:  work Provider Location: Home  PCP:  Shirlean Mylar, MD  Cardiologist:  Tobias Alexander, MD  Electrophysiologist:  None   Evaluation Performed:  Follow-Up Visit  Chief Complaint:  F/u BP  History of Present Illness:    Carol Roberts is a 72 y.o. female with with family history of early CAD, coronary CTA 10/2017 calcium score 1 and normal coronaries.Also history of tachypalpitations with no arrhythmias on 30  Day monitor, normal lipids not on statin. Right carotid bruit with < 50% bilateral internal carotid arteries.   Last saw Dr. Delton See 02/2018-BP up initially but came down on recheck.   I had telemedicine visit with patient 04/07/19 Patient BP has been running high   Sometimes gets extra salt in her diet with chips and popcorn. Walks<30 min/day. No chest pain, shortness of breath. Has had some palpitations recently- worst episode lasted 5-10 min relieved with deep breathing and walking around. Drinking more caffeine since working from home 2-3 cups/day. Works in [employer]er, [employer]ellery.  I started metoprolol 25 mg daily and asked her to decrease sodium and  caffeine.  F/u visit with me was cancelled because patient had a family emergency. BP readings from home were still high so I increased metoprolol to 50 mg daily. Her brother in law passed away. BP doing better but still 134-154 systolic and palpitations better. Walks hallways at work.      The patient does not have symptoms concerning for COVID-19 infection (fever, chills, cough, or new shortness of breath).    Past Medical History:  Diagnosis Date  . Anemia    "off and on"  . Arthritis    knees, hands, hips - hx of dislocated hip many yrs ago   . Fibromyalgia   . GERD (gastroesophageal reflux disease)   . H/O hiatal hernia    paraesophageal hernia, had surgery to repair  . Headache(784.0)    ocas migraines  . Paraesophageal hiatal hernia    causes vomiting, chest and left shoulder pain, sometimes left jaw pain   Past Surgical History:  Procedure Laterality Date  . 2011 RIGHT KNEE ARTHROSCOPY    . APPENDECTOMY    . BREAST EXCISIONAL BIOPSY Left 1987  . BUNIONECTOMY Right 06/14/2016   Procedure: RIGHT LAPIDUS AND MODIFIED MCBRIDE BUNIONECTOMY;  Surgeon: Toni Arthurs, MD;  Location: [facility];  Service: Orthopedics;  Laterality: Right;  RIGHT LAPIDUS AND MODIFIED MCBRIDE BUNIONECTOMY  . COLON SURGERY     bowel resection for diverticulitis  . COLOSTOMY CLOSURE  2010  . ESOPHAGEAL MANOMETRY N/A 06/08/2013   Procedure: ESOPHAGEAL MANOMETRY (EM);  Surgeon: Graylin Shiver, MD;  Location: [facility];  Service: Endoscopy;  Laterality: N/A;  Dr. Bosie Clos will read Manometry for Dr. Evette Cristal  . HAMMER TOE SURGERY Right 06/14/2016   Procedure: RIGHT TWO TO THREE HAMMER TOE CORRECTION;  Surgeon: Toni Arthurs, MD;  Location: [facility];  Service: Orthopedics;  Laterality: Right;  RIGHT TWO TO THREE HAMMER TOE CORRECTION  . HERNIA REPAIR    . HIATAL HERNIA REPAIR  2005   [place]  . HIATAL HERNIA REPAIR  2007   Lap with MTF bridge mesh.  Dr Daphine Deutscher,   .  LAPAROSCOPIC INCISIONAL / UMBILICAL / VENTRAL HERNIA REPAIR  11/2010   lap VWH with mesh  . LEFT COLECTOMY  2010   with colostomy for perforated diverticulitis  . TENDON TRANSFER Right 06/14/2016   Procedure: FLEXOR TO EXTENSOR TRANSFER;  Surgeon: Toni Arthurs, MD;  Location: [facility];  Service: Orthopedics;  Laterality: Right;  FLEXOR TO EXTENSOR TRANSFER  . VENTRAL HERNIA REPAIR  09/06/2010  . WEIL OSTEOTOMY Right 06/14/2016   Procedure: TWO TO THREE METATARSAL WEIL OSTEOTOMY;  Surgeon: Toni Arthurs, MD;  Location: [facility];  Service: Orthopedics;  Laterality: Right;  TWO TO THREE METATARSAL WEIL OSTEOTOMY     Current Meds  Medication Sig  . acetaminophen (TYLENOL) 500 MG tablet Take 1,000 mg by mouth every 6 (six) hours as needed for pain.  Marland Kitchen amitriptyline (ELAVIL) 25 MG tablet Take 1-2 tablets by mouth at bedtime.  Marland Kitchen aspirin EC 81 MG tablet Take 1 tablet (81 mg total) by mouth daily.  . calcium-vitamin D (OSCAL WITH D) 500-200 MG-UNIT per tablet Take 1 tablet by mouth 2 (two) times daily.  Marland Kitchen dicyclomine (BENTYL) 20 MG tablet Take 20 mg by mouth 4 (four) times daily as needed for spasms.   . Lactobacillus (ACIDOPHILUS PO) Take 1 capsule by mouth daily.   Marland Kitchen loratadine (CLARITIN) 10 MG tablet Take 10 mg by mouth daily as needed for allergies.   . Melatonin 5 MG TABS Take by mouth at bedtime as needed.  . metoprolol succinate (TOPROL-XL) 50 MG 24 hr tablet Take 1 tablet (50 mg total) by mouth daily. Take with or immediately following a meal.  . Multiple Vitamins-Minerals (MULTIVITAMIN WITH MINERALS) tablet Take 1 tablet by mouth daily.   . Multiple Vitamins-Minerals (PRESERVISION AREDS 2) CAPS Take 1 tablet by mouth 2 (two) times daily.  . polyethylene glycol (MIRALAX / GLYCOLAX) packet Take 17 g by mouth every other day.   . traMADol (ULTRAM) 50 MG tablet Take by mouth every 12 (twelve) hours as needed for moderate pain.      Allergies:   Adhesive [tape]    Social History   Tobacco Use  . Smoking status: Never Smoker  . Smokeless tobacco: Never Used  Substance Use Topics  . Alcohol use: Yes    Comment: social  . Drug use: No     Family Hx: The patient's family history includes Heart attack in her brother; Heart disease in her maternal grandmother and mother.  ROS:   Please see the history of present illness.      All other systems reviewed and are negative.   Prior CV studies:   The following studies were reviewed today:    TTE: 09/17/2016 - Left ventricle: The cavity size was normal. There was mild   concentric hypertrophy. Systolic function was normal. The   estimated ejection fraction was in the range of 60% to 65%. Wall   motion was normal; there were no  regional wall motion   abnormalities. Doppler parameters are consistent with abnormal   left ventricular relaxation (grade 1 diastolic dysfunction).   Doppler parameters are consistent with high ventricular filling   pressure. - Aortic valve: Transvalvular velocity was within the normal range.   There was no stenosis. There was mild regurgitation. - Mitral valve: Transvalvular velocity was within the normal range.   There was no evidence for stenosis. There was no regurgitation. - Left atrium: The atrium was moderately dilated. - Right ventricle: The cavity size was normal. Wall thickness was   normal. Systolic function was normal. - Right atrium: The atrium was mildly dilated. - Atrial septum: No defect or patent foramen ovale was identified   by color flow Doppler. - Tricuspid valve: There was mild regurgitation. - Pulmonary arteries: Systolic pressure was within the normal   range. PA peak pressure: 33 mm Hg (S).   Coronary CTA 11/2018FINDINGS: Non-cardiac: See separate report from [facility]. No significant findings on limited lung and soft tissue windows.   Calcium Score: Very tiny area of punctate calcium seen in proximal LAD and proximal RCA    Coronary Arteries: Right dominant with no anomalies   LM: Normal   LAD:  Less than 30% calcified plaque in proximal LAD   D1:  Normal   D2: Normal   Circumflex:  Normal   OM1:  Small and normal   OM2:  Normal   OM3:  Normal   RCA:  Less than 20% calcified plaque in proximal vessel   PDA: Normal   PLA:  Normal   IMPRESSION: 1) Calcium score 1 which is 43 rd percentile for age and sex   2) No obstructive CAD see description above right dominant   3) Normal aortic root 3.1 cm   4) Mildly dilated main pulmonary artery 2.9 cm   Charlton Haws   Electronically Signed: By: Charlton Haws M.D. On: 11/01/2017 15:23   Carotid dopplers 2017 IMPRESSION: Less than 50% stenosis in the bilateral internal carotid arteries. Tortuosity results an elevated velocities.     Electronically Signed   By: Jolaine Click M.D.   On: 09/18/2016 09:42       Labs/Other Tests and Data Reviewed:    EKG:  An ECG dated 10/03/17 was personally reviewed today and demonstrated:  NSR at 71/m  Recent Labs: No results found for requested labs within last 8760 hours.   Recent Lipid Panel No results found for: CHOL, TRIG, HDL, CHOLHDL, LDLCALC, LDLDIRECT  Wt Readings from Last 3 Encounters:  04/29/19 158 lb (71.7 kg)  04/07/19 158 lb (71.7 kg)  02/14/18 150 lb (68 kg)     Objective:    Vital Signs:  BP (!) 154/65   Pulse 74   Ht  (1.549 m)   Wt 158 lb (71.7 kg)   BMI 29.85 kg/m    VITAL SIGNS:  reviewed  ASSESSMENT & PLAN:    1. Essential HTN- metoprolol 50 mg added BP improved some but still running on the high side.  2. History of dyspnea on exertion with normal coronary CTA 2018 3. Right carotid bruit needs repeat dopplers in 6 months 4. History of palpitations-improved on metoprolol 5. Family history of early CAD  COVID-19 Education: The signs and symptoms of COVID-19 were discussed with the patient and how to seek care for testing (follow up with PCP or arrange  E-visit).   The importance of social distancing was discussed today.  Time:   Today, I  have spent 8:55 minutes with the patient with telehealth technology discussing the above problems.     Medication Adjustments/Labs and Tests Ordered: Current medicines are reviewed at length with the patient today.  Concerns regarding medicines are outlined above.   Tests Ordered: No orders of the defined types were placed in this encounter.   Medication Changes: No orders of the defined types were placed in this encounter.   Disposition:  Follow up in 3 week(s) Jacolyn ReedyMichele  3 weeks  Signed, Jacolyn ReedyMichele , PA-C  04/29/2019 11:37 AM    [facility]

## 2019-04-29 ENCOUNTER — Other Ambulatory Visit: Payer: Self-pay

## 2019-04-29 ENCOUNTER — Encounter: Payer: Self-pay | Admitting: Physician Assistant

## 2019-04-29 ENCOUNTER — Telehealth (INDEPENDENT_AMBULATORY_CARE_PROVIDER_SITE_OTHER): Payer: PPO | Admitting: Physician Assistant

## 2019-04-29 VITALS — BP 154/65 | HR 74 | Ht 61.0 in | Wt 158.0 lb

## 2019-04-29 DIAGNOSIS — R0609 Other forms of dyspnea: Secondary | ICD-10-CM

## 2019-04-29 DIAGNOSIS — Z8249 Family history of ischemic heart disease and other diseases of the circulatory system: Secondary | ICD-10-CM

## 2019-04-29 DIAGNOSIS — R002 Palpitations: Secondary | ICD-10-CM

## 2019-04-29 DIAGNOSIS — I10 Essential (primary) hypertension: Secondary | ICD-10-CM

## 2019-04-29 DIAGNOSIS — R0989 Other specified symptoms and signs involving the circulatory and respiratory systems: Secondary | ICD-10-CM

## 2019-04-29 MED ORDER — METOPROLOL SUCCINATE ER 50 MG PO TB24: ORAL_TABLET | ORAL | 3 refills | Status: DC

## 2019-04-29 NOTE — Patient Instructions (Signed)
Medication Instructions:  Your physician has recommended you make the following change in your medication:  1. Increase Toprol XL one and one half tablet (75mg ) daily.    Labwork: -None  Testing/Procedures: -None  Follow-Up: Your physician recommends that you keep your scheduled  follow up telehealth appointment on June 9 @ 11:30 am with Jacolyn Reedy, PA.   Any Other Special Instructions Will Be Listed Below (If Applicable).     If you need a refill on your cardiac medications before your next appointment, please call your pharmacy.

## 2019-05-11 ENCOUNTER — Other Ambulatory Visit: Payer: Self-pay | Admitting: Family Medicine

## 2019-05-11 DIAGNOSIS — Z1231 Encounter for screening mammogram for malignant neoplasm of breast: Secondary | ICD-10-CM

## 2019-05-11 DIAGNOSIS — Z1211 Encounter for screening for malignant neoplasm of colon: Secondary | ICD-10-CM | POA: Diagnosis not present

## 2019-05-18 DIAGNOSIS — I10 Essential (primary) hypertension: Secondary | ICD-10-CM | POA: Insufficient documentation

## 2019-05-18 NOTE — Progress Notes (Signed)
Virtual Visit via Video Note   This visit type was conducted due to national recommendations for restrictions regarding the COVID-19 Pandemic (e.g. social distancing) in an effort to limit this patient's exposure and mitigate transmission in our community.  Due to her co-morbid illnesses, this patient is at least at moderate risk for complications without adequate follow up.  This format is felt to be most appropriate for this patient at this time.  All issues noted in this document were discussed and addressed.  A limited physical exam was performed with this format.  Please refer to the patient's chart for her consent to [facility].   Date:  05/19/2019   ID:  Carol Roberts, DOB [birth year], MRN [id number]  Patient Location: Home Provider Location: Home  PCP:  Shirlean MylarWebb, Carol, MD  Cardiologist:  Tobias AlexanderKatarina Nelson, MD  Electrophysiologist:  None   Evaluation Performed:  Follow-Up Visit  Chief Complaint:  F/U HTN  History of Present Illness:    Carol Roberts is a 73 y.o. female with family history of early CAD, coronary CTA 10/2017 calcium score 1 and normal coronaries.Also history of tachypalpitations with no arrhythmias on 30  Day monitor, normal lipids not on statin. Right carotid bruit with < 50% bilateral internal carotid arteries.   Last saw Dr. Delton SeeNelson 02/2018-BP up initially but came down on recheck.  I had telemedicine visit with the patient 04/07/2019 at which time her blood pressure was running high and she was having a lot of palpitations.  She was drinking more caffeine since working from home.  I started metoprolol 25 mg daily.  Blood pressures continue to run high so I increased her metoprolol to 50 mg daily.  I saw the patient again 04/29/2019 at which time her blood pressure continued to run high so increased her Toprol to 75 mg once daily.  Patient watching salt and calories some and BP much better. BP running 132/68, 139/75. 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    The patient does not have symptoms concerning for COVID-19 infection (fever, chills, cough, or new shortness of breath).    Past Medical History:  Diagnosis Date  . Anemia    "off and on"  . Arthritis    knees, hands, hips - hx of dislocated hip many yrs ago   . Fibromyalgia   . GERD (gastroesophageal reflux disease)   . H/O hiatal hernia    paraesophageal hernia, had surgery to repair  . Headache(784.0)    ocas migraines  . Paraesophageal hiatal hernia    causes vomiting, chest and left shoulder pain, sometimes left jaw pain   Past Surgical History:  Procedure Laterality Date  . 2011 RIGHT KNEE ARTHROSCOPY    . APPENDECTOMY    . BREAST EXCISIONAL BIOPSY Left 1987  . BUNIONECTOMY Right 06/14/2016   Procedure: RIGHT LAPIDUS AND MODIFIED MCBRIDE BUNIONECTOMY;  Surgeon: Toni ArthursJohn Hewitt, MD;  Location: [facility];  Service: Orthopedics;  Laterality: Right;  RIGHT LAPIDUS AND MODIFIED MCBRIDE BUNIONECTOMY  . COLON SURGERY     bowel resection for diverticulitis  . COLOSTOMY CLOSURE  2010  . ESOPHAGEAL MANOMETRY N/A 06/08/2013   Procedure: ESOPHAGEAL MANOMETRY (EM);  Surgeon: Graylin ShiverSalem F Ganem, MD;  Location: [facility];  Service: Endoscopy;  Laterality: N/A;  Dr. Bosie ClosSchooler will read Manometry for Dr. Evette CristalGanem  . HAMMER TOE SURGERY Right 06/14/2016   Procedure: RIGHT TWO TO THREE HAMMER TOE CORRECTION;  Surgeon: Toni ArthursJohn Hewitt, MD;  Location: [facility];  Service: Orthopedics;  Laterality: Right;  RIGHT TWO TO THREE HAMMER TOE CORRECTION  . HERNIA REPAIR    . HIATAL HERNIA REPAIR  2005   [place]  . HIATAL HERNIA REPAIR  2007   Lap with MTF bridge mesh.  Dr Daphine DeutscherMartin,   . LAPAROSCOPIC INCISIONAL / UMBILICAL / VENTRAL HERNIA REPAIR  11/2010   lap VWH with mesh  . LEFT COLECTOMY  2010   with colostomy for perforated diverticulitis  . TENDON TRANSFER Right 06/14/2016   Procedure: FLEXOR TO EXTENSOR TRANSFER;  Surgeon: Toni ArthursJohn Hewitt, MD;   Location: [facility];  Service: Orthopedics;  Laterality: Right;  FLEXOR TO EXTENSOR TRANSFER  . VENTRAL HERNIA REPAIR  09/06/2010  . WEIL OSTEOTOMY Right 06/14/2016   Procedure: TWO TO THREE METATARSAL WEIL OSTEOTOMY;  Surgeon: Toni ArthursJohn Hewitt, MD;  Location: [facility];  Service: Orthopedics;  Laterality: Right;  TWO TO THREE METATARSAL WEIL OSTEOTOMY     Current Meds  Medication Sig  . acetaminophen (TYLENOL) 500 MG tablet Take 1,000 mg by mouth every 6 (six) hours as needed for pain.  Marland Kitchen. amitriptyline (ELAVIL) 25 MG tablet Take 1-2 tablets by mouth at bedtime.  Marland Kitchen. aspirin EC 81 MG tablet Take 1 tablet (81 mg total) by mouth daily.  . calcium-vitamin D (OSCAL WITH D) 500-200 MG-UNIT per tablet Take 1 tablet by mouth 2 (two) times daily.  Marland Kitchen. dicyclomine (BENTYL) 20 MG tablet Take 20 mg by mouth 4 (four) times daily as needed for spasms.   . Lactobacillus (ACIDOPHILUS PO) Take 1 capsule by mouth daily.   Marland Kitchen. loratadine (CLARITIN) 10 MG tablet Take 10 mg by mouth daily as needed for allergies.   . Melatonin 5 MG TABS Take by mouth at bedtime as needed.  . metoprolol succinate (TOPROL-XL) 50 MG 24 hr tablet Take one and one half tablet (75 mg ) daily  . Multiple Vitamins-Minerals (MULTIVITAMIN WITH MINERALS) tablet Take 1 tablet by mouth daily.   . Multiple Vitamins-Minerals (PRESERVISION AREDS 2) CAPS Take 1 tablet by mouth 2 (two) times daily.  . polyethylene glycol (MIRALAX / GLYCOLAX) packet Take 17 g by mouth every other day.   . traMADol (ULTRAM) 50 MG tablet Take by mouth every 12 (twelve) hours as needed for moderate pain.   Marland Kitchen. venlafaxine XR (EFFEXOR-XR) 75 MG 24 hr capsule Take 75 mg by mouth daily with breakfast.      Allergies:   Adhesive [tape]   Social History   Tobacco Use  . Smoking status: Never Smoker  . Smokeless tobacco: Never Used  Substance Use Topics  . Alcohol use: Yes    Comment: social  . Drug use: No     Family Hx: The patient's family  history includes Heart attack in her brother; Heart disease in her maternal grandmother and mother.  ROS:   Please see the history of present illness.      All other systems reviewed and are negative.   Prior CV studies:   The following studies were reviewed today:     TTE: 09/17/2016 - Left ventricle: The cavity size was normal. There was mild   concentric hypertrophy. Systolic function was normal. The   estimated ejection fraction was in the range of 60% to 65%. Wall   motion was normal; there were no regional wall motion   abnormalities. Doppler parameters are consistent with abnormal   left ventricular relaxation (grade 1 diastolic dysfunction).   Doppler parameters are consistent with  high ventricular filling   pressure. - Aortic valve: Transvalvular velocity was within the normal range.   There was no stenosis. There was mild regurgitation. - Mitral valve: Transvalvular velocity was within the normal range.   There was no evidence for stenosis. There was no regurgitation. - Left atrium: The atrium was moderately dilated. - Right ventricle: The cavity size was normal. Wall thickness was   normal. Systolic function was normal. - Right atrium: The atrium was mildly dilated. - Atrial septum: No defect or patent foramen ovale was identified   by color flow Doppler. - Tricuspid valve: There was mild regurgitation. - Pulmonary arteries: Systolic pressure was within the normal   range. PA peak pressure: 33 mm Hg (S).   Coronary CTA 11/2018FINDINGS: Non-cardiac: See separate report from [facility]iology. No significant findings on limited lung and soft tissue windows.   Calcium Score: Very tiny area of punctate calcium seen in proximal LAD and proximal RCA   Coronary Arteries: Right dominant with no anomalies   LM: Normal   LAD:  Less than 30% calcified plaque in proximal LAD   D1:  Normal   D2: Normal   Circumflex:  Normal   OM1:  Small and normal   OM2:   Normal   OM3:  Normal   RCA:  Less than 20% calcified plaque in proximal vessel   PDA: Normal   PLA:  Normal   IMPRESSION: 1) Calcium score 1 which is 43 rd percentile for age and sex   2) No obstructive CAD see description above right dominant   3) Normal aortic root 3.1 cm   4) Mildly dilated main pulmonary artery 2.9 cm   Jenkins Rouge   Electronically Signed: By: Jenkins Rouge M.D. On: 11/01/2017 15:23   Carotid dopplers 2017 IMPRESSION: Less than 50% stenosis in the bilateral internal carotid arteries. Tortuosity results an elevated velocities.     Electronically Signed   By: Marybelle Killings M.D.   On: 09/18/2016 09:42       Labs/Other Tests and Data Reviewed:    EKG:  No ECG reviewed.  Recent Labs: No results found for requested labs within last 8760 hours.   Recent Lipid Panel No results found for: CHOL, TRIG, HDL, CHOLHDL, LDLCALC, LDLDIRECT  Wt Readings from Last 3 Encounters:  05/19/19 158 lb (71.7 kg)  04/29/19 158 lb (71.7 kg)  04/07/19 158 lb (71.7 kg)     Objective:    Vital Signs:  BP 124/76   Pulse 64   Ht 5\' 1"  (1.549 m)   Wt 158 lb (71.7 kg)   BMI 29.85 kg/m    VITAL SIGNS:  reviewed GEN:  no acute distress RESPIRATORY:  normal respiratory effort, symmetric expansion CARDIOVASCULAR:  no peripheral edema  ASSESSMENT & PLAN:    1. Essential hypertension Toprol has been titrated up to 75 mg daily-BP doing much better. Continue current treatment 2. Palpitations improved on metoprolol 3. History of dyspnea on exertion with normal coronary CTA in 2018 4. Right carotid bruit will need Dopplers repeated in 5 months. F/U with Dr. Meda Coffee in 6 months 5. Family history of early CAD  COVID-39 Education: The signs and symptoms of COVID-19 were discussed with the patient and how to seek The importance of social distancing was discussed today.  Time:   Today, I have spent 58minutes with the patient with telehealth technology discussing the  above problems.     Medication Adjustments/Labs and Tests Ordered: Current medicines are reviewed at  length with the patient today.  Concerns regarding medicines are outlined above.   Tests Ordered: No orders of the defined types were placed in this encounter.   Medication Changes: No orders of the defined types were placed in this encounter.   Disposition:  Follow up in 6 month(s) Dr. Delton SeeNelson  Signed, Jacolyn ReedyMichele Lenze, PA-C  05/19/2019 11:33 AM    [facility]

## 2019-05-19 ENCOUNTER — Other Ambulatory Visit: Payer: Self-pay

## 2019-05-19 ENCOUNTER — Telehealth (INDEPENDENT_AMBULATORY_CARE_PROVIDER_SITE_OTHER): Payer: Self-pay | Admitting: Physician Assistant

## 2019-05-19 ENCOUNTER — Encounter: Payer: Self-pay | Admitting: Physician Assistant

## 2019-05-19 VITALS — BP 124/76 | HR 64 | Ht 61.0 in | Wt 158.0 lb

## 2019-05-19 DIAGNOSIS — R002 Palpitations: Secondary | ICD-10-CM

## 2019-05-19 DIAGNOSIS — R0609 Other forms of dyspnea: Secondary | ICD-10-CM

## 2019-05-19 DIAGNOSIS — I10 Essential (primary) hypertension: Secondary | ICD-10-CM

## 2019-05-19 DIAGNOSIS — R0989 Other specified symptoms and signs involving the circulatory and respiratory systems: Secondary | ICD-10-CM

## 2019-05-19 DIAGNOSIS — Z8249 Family history of ischemic heart disease and other diseases of the circulatory system: Secondary | ICD-10-CM

## 2019-05-19 NOTE — Patient Instructions (Signed)
Medication Instructions:  Your physician recommends that you continue on your current medications as directed. Please refer to the Current Medication list given to you today.  If you need a refill on your cardiac medications before your next appointment, please call your pharmacy.   Lab work: None Ordered  If you have labs (blood work) drawn today and your tests are completely normal, you will receive your results only by: . MyChart Message (if you have MyChart) OR . A paper copy in the mail If you have any lab test that is abnormal or we need to change your treatment, we will call you to review the results.  Testing/Procedures: None ordered  Follow-Up: At [facility], you and your health needs are our priority.  As part of our continuing mission to provide you with exceptional heart care, we have created designated Provider Care Teams.  These Care Teams include your primary Cardiologist (physician) and Advanced Practice Providers (APPs -  Physician Assistants and Nurse Practitioners) who all work together to provide you with the care you need, when you need it. . You will need a follow up appointment in 6 months.  Please call our office 2 months in advance to schedule this appointment.  You may see Katarina Nelson, MD or one of the following Advanced Practice Providers on your designated Care Team:   . Brittainy Simmons, PA-C . Dayna Dunn, PA-C . Michele Lenze, PA-C  Any Other Special Instructions Will Be Listed Below (If Applicable).    

## 2019-06-16 DIAGNOSIS — Z03818 Encounter for observation for suspected exposure to other biological agents ruled out: Secondary | ICD-10-CM | POA: Diagnosis not present

## 2019-06-23 DIAGNOSIS — Z03818 Encounter for observation for suspected exposure to other biological agents ruled out: Secondary | ICD-10-CM | POA: Diagnosis not present

## 2019-06-29 ENCOUNTER — Ambulatory Visit
Admission: RE | Admit: 2019-06-29 | Discharge: 2019-06-29 | Disposition: A | Discharge status: Home / Self Care | Payer: PPO | Source: Ambulatory Visit | Attending: Family Medicine | Admitting: Family Medicine

## 2019-06-29 ENCOUNTER — Other Ambulatory Visit: Payer: Self-pay

## 2019-06-29 DIAGNOSIS — Z03818 Encounter for observation for suspected exposure to other biological agents ruled out: Secondary | ICD-10-CM | POA: Diagnosis not present

## 2019-06-29 DIAGNOSIS — Z1231 Encounter for screening mammogram for malignant neoplasm of breast: Secondary | ICD-10-CM | POA: Diagnosis not present

## 2019-07-23 ENCOUNTER — Other Ambulatory Visit: Payer: Self-pay

## 2019-07-23 MED ORDER — METOPROLOL SUCCINATE ER 50 MG PO TB24: ORAL_TABLET | ORAL | 3 refills | Status: DC

## 2019-07-27 DIAGNOSIS — K5730 Diverticulosis of large intestine without perforation or abscess without bleeding: Secondary | ICD-10-CM | POA: Diagnosis not present

## 2019-07-27 DIAGNOSIS — Z86010 Personal history of colonic polyps: Secondary | ICD-10-CM | POA: Diagnosis not present

## 2019-07-27 DIAGNOSIS — Z980 Intestinal bypass and anastomosis status: Secondary | ICD-10-CM | POA: Diagnosis not present

## 2019-07-27 DIAGNOSIS — K648 Other hemorrhoids: Secondary | ICD-10-CM | POA: Diagnosis not present

## 2019-07-27 DIAGNOSIS — Z1211 Encounter for screening for malignant neoplasm of colon: Secondary | ICD-10-CM | POA: Diagnosis not present

## 2019-08-27 DIAGNOSIS — Z23 Encounter for immunization: Secondary | ICD-10-CM | POA: Diagnosis not present

## 2019-09-14 ENCOUNTER — Other Ambulatory Visit: Payer: Self-pay

## 2019-09-14 ENCOUNTER — Ambulatory Visit (HOSPITAL_COMMUNITY)
Admission: RE | Admit: 2019-09-14 | Discharge: 2019-09-14 | Disposition: A | Discharge status: Home / Self Care | Payer: PPO | Source: Ambulatory Visit | Attending: Cardiovascular Disease | Admitting: Cardiovascular Disease

## 2019-09-14 DIAGNOSIS — R0989 Other specified symptoms and signs involving the circulatory and respiratory systems: Secondary | ICD-10-CM | POA: Diagnosis not present

## 2019-09-15 ENCOUNTER — Telehealth: Payer: Self-pay | Admitting: Physician Assistant

## 2019-09-15 DIAGNOSIS — I6523 Occlusion and stenosis of bilateral carotid arteries: Secondary | ICD-10-CM

## 2019-09-15 MED ORDER — ATORVASTATIN CALCIUM 10 MG PO TABS: 10.0000 mg | ORAL_TABLET | Freq: Every day | ORAL | 3 refills | Status: DC

## 2019-09-15 NOTE — Telephone Encounter (Signed)
The patient has been notified of the result and recommendations. Patient verbalized understanding. Rx sent to preferred pharmacy and fasting lab appt made for 12/16/19. All questions (if any) were answered. Repeat US placed for 1 year. Cleon Gustin, RN 09/15/2019 5:19 PM

## 2019-09-15 NOTE — Telephone Encounter (Signed)
-----   Message from Imogene Burn, PA-C sent at 09/14/2019 10:29 AM EDT ----- Carotid dopplers 40-59% stenosis bilaterally. Need repeat in 1 yr. thanks

## 2019-09-15 NOTE — Telephone Encounter (Signed)
° ° °  Please return call with carotid results °

## 2019-10-22 ENCOUNTER — Encounter: Payer: Self-pay | Admitting: Cardiology

## 2019-11-19 DIAGNOSIS — Z03818 Encounter for observation for suspected exposure to other biological agents ruled out: Secondary | ICD-10-CM | POA: Diagnosis not present

## 2019-11-20 ENCOUNTER — Other Ambulatory Visit: Payer: Self-pay

## 2019-11-20 ENCOUNTER — Encounter: Payer: Self-pay | Admitting: *Deleted

## 2019-11-20 ENCOUNTER — Telehealth (INDEPENDENT_AMBULATORY_CARE_PROVIDER_SITE_OTHER): Payer: PPO | Admitting: Cardiology

## 2019-11-20 DIAGNOSIS — R0609 Other forms of dyspnea: Secondary | ICD-10-CM

## 2019-11-20 DIAGNOSIS — R0600 Dyspnea, unspecified: Secondary | ICD-10-CM | POA: Diagnosis not present

## 2019-11-20 DIAGNOSIS — Z8249 Family history of ischemic heart disease and other diseases of the circulatory system: Secondary | ICD-10-CM

## 2019-11-20 DIAGNOSIS — R002 Palpitations: Secondary | ICD-10-CM

## 2019-11-20 DIAGNOSIS — I10 Essential (primary) hypertension: Secondary | ICD-10-CM

## 2019-11-20 MED ORDER — HYDROCHLOROTHIAZIDE 25 MG PO TABS: 25.0000 mg | ORAL_TABLET | Freq: Every day | ORAL | 1 refills | Status: DC

## 2019-11-20 NOTE — Telephone Encounter (Signed)
Pts visit is complete and all instructions have been endorsed to the pt.

## 2019-11-20 NOTE — Patient Instructions (Addendum)
Medication Instructions:   START TAKING HYDROCHLOROTHIAZIDE 25 MG BY MOUTH DAILY  *If you need a refill on your cardiac medications before your next appointment, please call your pharmacy*     Follow-Up: At [facility], you and your health needs are our priority.  As part of our continuing mission to provide you with exceptional heart care, we have created designated Provider Care Teams.  These Care Teams include your primary Cardiologist (physician) and Advanced Practice Providers (APPs -  Physician Assistants and Nurse Practitioners) who all work together to provide you with the care you need, when you need it.  Your next appointment:   WITH DR. Meda Coffee IN PERSON ON Wednesday March 02, 2020 AT 10 AM   The format for your next appointment:   In Person  Provider:   Ena Dawley, MD  Other Instructions  DR. NELSON WOULD LIKE FOR YOU TO TAKE A DIARY OF YOUR BLOOD PRESSURE AND HEART RATES  STARTING ONE WEEK FROM NOW AND SEND [place] THOSE RESULTS THROUGH YOUR [place] ACCOUNT OR CALL THEM INTO THE OFFICE TO REPORT TO OUR OPERATORS AT [phone].

## 2019-11-20 NOTE — Progress Notes (Signed)
Virtual Visit via Telephone Note   This visit type was conducted due to national recommendations for restrictions regarding the COVID-19 Pandemic (e.g. social distancing) in an effort to limit this patient's exposure and mitigate transmission in our community.  Due to her co-morbid illnesses, this patient is at least at moderate risk for complications without adequate follow up.  This format is felt to be most appropriate for this patient at this time.  All issues noted in this document were discussed and addressed.  A limited physical exam was performed with this format.  Please refer to the patient's chart for her consent to telehealth for [facility].   Date:  11/20/2019   ID:  Carol Roberts, DOB 20-Sep-1946, MRN [id number]  Patient Location: Home Provider Location: Home  PCP:  Carol Small, MD  Cardiologist:  Carol Dawley, MD  Electrophysiologist:  None   Evaluation Performed:  Follow-Up Visit  Chief Complaint:  F/U HTN, LE edema  History of Present Illness:    Carol Roberts is a 73 y.o. female with family history of early CAD, coronary CTA 10/2017 calcium score 1 and normal coronaries.Also history of tachypalpitations with no arrhythmias on 30  Day monitor, normal lipids not on statin. Right carotid bruit with < 50% bilateral internal carotid arteries.   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  She has not had time to exercise, she works long hours and is exhausted afterwards.  She gets short of breath on moderate exertion however this is not significant change from prior.  She has mild lower extremity edema.  She gets anxiety attacks with which he feels chest tightness and palpitations but no chest pain on exertion.  She cooks her own meals and is careful with diet.  The patient does not have symptoms concerning for COVID-19 infection (fever, chills,  cough, or new shortness of breath).   Past Medical History:  Diagnosis Date  . Anemia    "off and on"  . Arthritis    knees, hands, hips - hx of dislocated hip many yrs ago   . Fibromyalgia   . GERD (gastroesophageal reflux disease)   . H/O hiatal hernia    paraesophageal hernia, had surgery to repair  . Headache(784.0)    ocas migraines  . Paraesophageal hiatal hernia    causes vomiting, chest and left shoulder pain, sometimes left jaw pain   Past Surgical History:  Procedure Laterality Date  . 2011 RIGHT KNEE ARTHROSCOPY    . APPENDECTOMY    . BREAST EXCISIONAL BIOPSY Left 1987  . BUNIONECTOMY Right 06/14/2016   Procedure: RIGHT LAPIDUS AND MODIFIED MCBRIDE BUNIONECTOMY;  Surgeon: Wylene Simmer, MD;  Location: [place];  Service: Orthopedics;  Laterality: Right;  RIGHT LAPIDUS AND MODIFIED MCBRIDE BUNIONECTOMY  . COLON SURGERY     bowel resection for diverticulitis  . COLOSTOMY CLOSURE  2010  . ESOPHAGEAL MANOMETRY N/A 06/08/2013   Procedure: ESOPHAGEAL MANOMETRY (EM);  Surgeon: Wonda Horner, MD;  Location: [facility];  Service: Endoscopy;  Laterality: N/A;  Dr. Michail Sermon will read Manometry for Dr. Penelope Coop  . HAMMER TOE SURGERY Right 06/14/2016   Procedure: RIGHT TWO TO THREE HAMMER TOE CORRECTION;  Surgeon: Wylene Simmer, MD;  Location: [place];  Service: Orthopedics;  Laterality: Right;  RIGHT TWO TO THREE HAMMER TOE CORRECTION  . HERNIA REPAIR    . HIATAL HERNIA  REPAIR  [address]  . HIATAL HERNIA REPAIR  2007   Lap with MTF bridge mesh.  Dr Daphine Deutscher,   . LAPAROSCOPIC INCISIONAL / UMBILICAL / VENTRAL HERNIA REPAIR  11/2010   lap VWH with mesh  . LEFT COLECTOMY  2010   with colostomy for perforated diverticulitis  . TENDON TRANSFER Right 06/14/2016   Procedure: FLEXOR TO EXTENSOR TRANSFER;  Surgeon: Toni Arthurs, MD;  Location: [facility];  Service: Orthopedics;  Laterality: Right;  FLEXOR TO EXTENSOR TRANSFER  . VENTRAL  HERNIA REPAIR  09/06/2010  . WEIL OSTEOTOMY Right 06/14/2016   Procedure: TWO TO THREE METATARSAL WEIL OSTEOTOMY;  Surgeon: Toni Arthurs, MD;  Location: [facility];  Service: Orthopedics;  Laterality: Right;  TWO TO THREE METATARSAL WEIL OSTEOTOMY     Current Meds  Medication Sig  . acetaminophen (TYLENOL) 500 MG tablet Take 1,000 mg by mouth every 6 (six) hours as needed for pain.  Marland Kitchen amitriptyline (ELAVIL) 25 MG tablet Take 1-2 tablets by mouth at bedtime.  Marland Kitchen aspirin EC 81 MG tablet Take 1 tablet (81 mg total) by mouth daily.  Marland Kitchen atorvastatin (LIPITOR) 10 MG tablet Take 1 tablet (10 mg total) by mouth daily.  . calcium-vitamin D (OSCAL WITH D) 500-200 MG-UNIT per tablet Take 1 tablet by mouth 2 (two) times daily.  Marland Kitchen dicyclomine (BENTYL) 20 MG tablet Take 20 mg by mouth 4 (four) times daily as needed for spasms.   . Lactobacillus (ACIDOPHILUS PO) Take 1 capsule by mouth daily.   Marland Kitchen loratadine (CLARITIN) 10 MG tablet Take 10 mg by mouth daily as needed for allergies.   . Melatonin 5 MG TABS Take by mouth at bedtime as needed.  . metoprolol succinate (TOPROL-XL) 50 MG 24 hr tablet Take one and one half tablet (75 mg ) daily  . Multiple Vitamins-Minerals (MULTIVITAMIN WITH MINERALS) tablet Take 1 tablet by mouth daily.   . Multiple Vitamins-Minerals (PRESERVISION AREDS 2) CAPS Take 1 tablet by mouth 2 (two) times daily.  . polyethylene glycol (MIRALAX / GLYCOLAX) packet Take 17 g by mouth every other day.   . traMADol (ULTRAM) 50 MG tablet Take by mouth every 12 (twelve) hours as needed for moderate pain.   Marland Kitchen venlafaxine XR (EFFEXOR-XR) 75 MG 24 hr capsule Take 75 mg by mouth daily with breakfast.      Allergies:   Adhesive [tape]   Social History   Tobacco Use  . Smoking status: Never Smoker  . Smokeless tobacco: Never Used  Substance Use Topics  . Alcohol use: Yes    Comment: social  . Drug use: No    Family Hx: The patient's family history includes Heart attack in her  brother; Heart disease in her maternal grandmother and mother.  ROS:   Please see the history of present illness.      All other systems reviewed and are negative.  Prior CV studies:   The following studies were reviewed today:     TTE: 09/17/2016 - Left ventricle: The cavity size was normal. There was mild   concentric hypertrophy. Systolic function was normal. The   estimated ejection fraction was in the range of 60% to 65%. Wall   motion was normal; there were no regional wall motion   abnormalities. Doppler parameters are consistent with abnormal   left ventricular relaxation (grade 1 diastolic dysfunction).   Doppler parameters are consistent with high ventricular filling   pressure. - Aortic valve: Transvalvular velocity was  within the normal range.   There was no stenosis. There was mild regurgitation. - Mitral valve: Transvalvular velocity was within the normal range.   There was no evidence for stenosis. There was no regurgitation. - Left atrium: The atrium was moderately dilated. - Right ventricle: The cavity size was normal. Wall thickness was   normal. Systolic function was normal. - Right atrium: The atrium was mildly dilated. - Atrial septum: No defect or patent foramen ovale was identified   by color flow Doppler. - Tricuspid valve: There was mild regurgitation. - Pulmonary arteries: Systolic pressure was within the normal   range. PA peak pressure: 33 mm Hg (S).   Coronary CTA 11/2018FINDINGS: Non-cardiac: [facility]. No significant findings on limited lung and soft tissue windows.   Calcium Score: Very tiny area of punctate calcium seen in proximal LAD and proximal RCA   Coronary Arteries: Right dominant with no anomalies   LM: Normal   LAD:  Less than 30% calcified plaque in proximal LAD   D1:  Normal   D2: Normal   Circumflex:  Normal   OM1:  Roberts and normal   OM2:  Normal   OM3:  Normal   RCA:  Less than  20% calcified plaque in proximal vessel   PDA: Normal   PLA:  Normal   IMPRESSION: 1) Calcium score 1 which is 43 rd percentile for age and sex   2) No obstructive CAD see description above right dominant   3) Normal aortic root 3.1 cm   4) Mildly dilated main pulmonary artery 2.9 cm   Charlton HawsPeter Nishan   Carotid dopplers 2017 IMPRESSION: Less than 50% stenosis in the bilateral internal carotid arteries. Tortuosity results an elevated velocities.   Electronically Signed   By: Jolaine ClickArthur  Hoss M.D.   On: 09/18/2016 09:42    Labs/Other Tests and Data Reviewed:    EKG:  No ECG reviewed.  Recent Labs: No results found for requested labs within last 8760 hours.   Recent Lipid Panel No results found for: CHOL, TRIG, HDL, CHOLHDL, LDLCALC, LDLDIRECT  Wt Readings from Last 3 Encounters:  11/20/19 158 lb (71.7 kg)  05/19/19 158 lb (71.7 kg)  04/29/19 158 lb (71.7 kg)    Objective:    Vital Signs:  BP (!) 148/69   Pulse 65   Wt 158 lb (71.7 kg)   BMI 29.85 kg/m    VITAL SIGNS:  reviewed GEN:  no acute distress RESPIRATORY:  normal respiratory effort, symmetric expansion CARDIOVASCULAR:  no peripheral edema  ASSESSMENT & PLAN:    1. Essential hypertension -we will continue Toprol-XL 75 mg daily, I will add hydrochlorothiazide 25 mg daily as her blood pressure is persistently in 140s, she also has mild lower extremity edema.  She is advised to start recording her blood pressure and send us numbers to see if we need to adjust medications any further. 2. Lower extremity edema -probably multifactorial secondary to venous insufficiency and sedentary lifestyle, we will start hydrochlorothiazide 25 mg daily, she is also advised to use compression socks and regular walking.  3. Palpitations improved on metoprolol, she still gets them with episodes of anxiety, she is advised to use meditation techniques and regular exercise. 4. History of dyspnea on exertion with normal coronary CTA in  2018 5. Right carotid bruit -recent carotid ultrasound in October 2020 showed bilateral 40 to 59% carotid stenosis.  Will repeat in 12 months. 6. Family history of early CAD -she is  on atorvastatin 10 mg daily with lipids at goal.  Also score in 2017 was 1.  COVID-19 Education: The signs and symptoms of COVID-19 were discussed with the patient and how to seek The importance of social distancing was discussed today.  Time:   Today, I have spent with the patient with telehealth technology discussing the above problems.     Medication Adjustments/Labs and Tests Ordered: Current medicines are reviewed at length with the patient today.  Concerns regarding medicines are outlined above.   Tests Ordered: No orders of the defined types were placed in this encounter.   Medication Changes: No orders of the defined types were placed in this encounter.   Disposition:  Follow up in 6 month(s) Dr. Delton See  Signed, Tobias Alexander, MD  11/20/2019 11:17 AM    [facility]

## 2019-11-20 NOTE — Addendum Note (Signed)
Addended by: Nuala Alpha on: 11/20/2019 11:45 AM   Modules accepted: Orders

## 2019-12-02 ENCOUNTER — Other Ambulatory Visit: Payer: Self-pay

## 2019-12-02 MED ORDER — METOPROLOL SUCCINATE ER 50 MG PO TB24: ORAL_TABLET | ORAL | 1 refills | Status: DC

## 2019-12-02 NOTE — Telephone Encounter (Signed)
Pt called in requesting refill on Metoprolol. States the pharmacy will not give her the new dosage until a new prescription is sent in to the pharmacy.

## 2019-12-03 MED ORDER — METOPROLOL SUCCINATE ER 50 MG PO TB24: ORAL_TABLET | ORAL | 3 refills | Status: DC

## 2019-12-05 DIAGNOSIS — H538 Other visual disturbances: Secondary | ICD-10-CM | POA: Diagnosis not present

## 2019-12-05 DIAGNOSIS — R519 Headache, unspecified: Secondary | ICD-10-CM | POA: Diagnosis not present

## 2019-12-15 ENCOUNTER — Telehealth: Payer: Self-pay | Admitting: *Deleted

## 2019-12-15 NOTE — Telephone Encounter (Signed)
Excellent lipids and normal LFTs

## 2019-12-15 NOTE — Telephone Encounter (Signed)
FYI--Dr. Delton See, the pt was to come into the office tomorrow for lipids and LFTs, as you advised at last virtual visit.  She would like to have her appt cancelled, for she had lipids, CMET, CBC, Vit D level done at select reference laboratories on 11/19/19.  She faxed these results to our office today, with attention to you. Nothing abnormal noted on results when preliminary reviewed.  Will have her labs scanned into her chart today, for you to review and advise on.  Be on the lookout for it.  Cancelled the pts lab appt for tomorrow 12/16/19.

## 2019-12-16 ENCOUNTER — Other Ambulatory Visit: Payer: PPO

## 2019-12-16 NOTE — Telephone Encounter (Signed)
Notified the pt that per Dr. Delton See, her lipids were excellent and she normal LFTs, from scanned labs sent to review.  Informed the pt that we will see her as scheduled. Pt verbalized understanding and agrees with this plan.

## 2019-12-22 DIAGNOSIS — Z03818 Encounter for observation for suspected exposure to other biological agents ruled out: Secondary | ICD-10-CM | POA: Diagnosis not present

## 2019-12-30 DIAGNOSIS — H43813 Vitreous degeneration, bilateral: Secondary | ICD-10-CM | POA: Diagnosis not present

## 2019-12-30 DIAGNOSIS — H31002 Unspecified chorioretinal scars, left eye: Secondary | ICD-10-CM | POA: Diagnosis not present

## 2020-01-08 ENCOUNTER — Ambulatory Visit: Payer: PPO | Admitting: Cardiology

## 2020-01-22 DIAGNOSIS — Z20828 Contact with and (suspected) exposure to other viral communicable diseases: Secondary | ICD-10-CM | POA: Diagnosis not present

## 2020-02-26 DIAGNOSIS — Z20828 Contact with and (suspected) exposure to other viral communicable diseases: Secondary | ICD-10-CM | POA: Diagnosis not present

## 2020-03-02 ENCOUNTER — Ambulatory Visit: Payer: PPO | Admitting: Cardiology

## 2020-03-02 ENCOUNTER — Encounter: Payer: Self-pay | Admitting: Cardiology

## 2020-03-02 ENCOUNTER — Other Ambulatory Visit: Payer: Self-pay

## 2020-03-02 VITALS — BP 148/82 | HR 79 | Ht 61.5 in | Wt 161.8 lb

## 2020-03-02 DIAGNOSIS — I10 Essential (primary) hypertension: Secondary | ICD-10-CM | POA: Diagnosis not present

## 2020-03-02 DIAGNOSIS — Z8249 Family history of ischemic heart disease and other diseases of the circulatory system: Secondary | ICD-10-CM | POA: Diagnosis not present

## 2020-03-02 DIAGNOSIS — I6523 Occlusion and stenosis of bilateral carotid arteries: Secondary | ICD-10-CM

## 2020-03-02 DIAGNOSIS — E785 Hyperlipidemia, unspecified: Secondary | ICD-10-CM | POA: Diagnosis not present

## 2020-03-02 LAB — COMPREHENSIVE METABOLIC PANEL
ALT: 18 IU/L (ref 0–32)
AST: 23 IU/L (ref 0–40)
Albumin/Globulin Ratio: 1.8 (ref 1.2–2.2)
Albumin: 4.3 g/dL (ref 3.7–4.7)
Alkaline Phosphatase: 54 IU/L (ref 39–117)
BUN/Creatinine Ratio: 24 (ref 12–28)
BUN: 16 mg/dL (ref 8–27)
Bilirubin Total: 0.5 mg/dL (ref 0.0–1.2)
CO2: 27 mmol/L (ref 20–29)
Calcium: 10.2 mg/dL (ref 8.7–10.3)
Chloride: 98 mmol/L (ref 96–106)
Creatinine, Ser: 0.66 mg/dL (ref 0.57–1.00)
GFR calc Af Amer: 102 mL/min/{1.73_m2} (ref >59)
GFR calc non Af Amer: 89 mL/min/{1.73_m2} (ref >59)
Globulin, Total: 2.4 g/dL (ref 1.5–4.5)
Glucose: 84 mg/dL (ref 65–99)
Potassium: 4 mmol/L (ref 3.5–5.2)
Sodium: 137 mmol/L (ref 134–144)
Total Protein: 6.7 g/dL (ref 6.0–8.5)

## 2020-03-02 LAB — CBC
Hematocrit: 39.6 % (ref 34.0–46.6)
Hemoglobin: 13.4 g/dL (ref 11.1–15.9)
MCH: 32.1 pg (ref 26.6–33.0)
MCHC: 33.8 g/dL (ref 31.5–35.7)
MCV: 95 fL (ref 79–97)
Platelets: 215 10*3/uL (ref 150–450)
RBC: 4.17 x10E6/uL (ref 3.77–5.28)
RDW: 12.5 % (ref 11.7–15.4)
WBC: 5.3 10*3/uL (ref 3.4–10.8)

## 2020-03-02 LAB — TSH: TSH: 1.17 u[IU]/mL (ref 0.450–4.500)

## 2020-03-02 MED ORDER — LOSARTAN POTASSIUM 25 MG PO TABS: 25.0000 mg | ORAL_TABLET | Freq: Every day | ORAL | 1 refills | Status: DC

## 2020-03-02 NOTE — Progress Notes (Signed)
Cardiology Office Note:    Date:  03/02/2020   ID:  Carol Roberts, DOB 1946/01/16, MRN [id number]  PCP:  Maurice Small, MD  Cardiologist:  Ena Dawley, MD  Electrophysiologist:  None   Referring MD: Maurice Small, MD   No chief complaint on file.  History of Present Illness:    Carol Roberts is a 74 y.o. female with a hx of  early CAD, coronary CTA 10/2017 calcium score 1 and normal coronaries. Also history of tachypalpitations with no arrhythmias on 30  Day monitor, normal lipids not on statin. Right carotid bruit with < 50% bilateral internal carotid arteries.   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  She has not had time to exercise, she works long hours and is exhausted afterwards.  She gets short of breath on moderate exertion however this is not significant change from prior.  She has mild lower extremity edema.  She gets anxiety attacks with which he feels chest tightness and palpitations but no chest pain on exertion.  She cooks her own meals and is careful with diet.  03/02/2020 -the patient is coming after 3 months, she has been doing well, she works long hours does not have time to exercise, she has noticed that her blood pressure has been elevated, she does not have time to exercise, she is occasional lower extremity edema after prolonged sitting.  Past Medical History:  Diagnosis Date  . Anemia    "off and on"  . Arthritis    knees, hands, hips - hx of dislocated hip many yrs ago   . Fibromyalgia   . GERD (gastroesophageal reflux disease)   . H/O hiatal hernia    paraesophageal hernia, had surgery to repair  . Headache(784.0)    ocas migraines  . Paraesophageal hiatal hernia    causes vomiting, chest and left shoulder pain, sometimes left jaw pain    Past Surgical History:  Procedure Laterality Date  . 2011 RIGHT KNEE ARTHROSCOPY     . APPENDECTOMY    . BREAST EXCISIONAL BIOPSY Left 1987  . BUNIONECTOMY Right 06/14/2016   Procedure: RIGHT LAPIDUS AND MODIFIED MCBRIDE BUNIONECTOMY;  Surgeon: Wylene Simmer, MD;  Location: [place];  Service: Orthopedics;  Laterality: Right;  RIGHT LAPIDUS AND MODIFIED MCBRIDE BUNIONECTOMY  . COLON SURGERY     bowel resection for diverticulitis  . COLOSTOMY CLOSURE  2010  . ESOPHAGEAL MANOMETRY N/A 06/08/2013   Procedure: ESOPHAGEAL MANOMETRY (EM);  Surgeon: Wonda Horner, MD;  Location: [facility];  Service: Endoscopy;  Laterality: N/A;  Dr. Michail Sermon will read Manometry for Dr. Penelope Coop  . HAMMER TOE SURGERY Right 06/14/2016   Procedure: RIGHT TWO TO THREE HAMMER TOE CORRECTION;  Surgeon: Wylene Simmer, MD;  Location: [place];  Service: Orthopedics;  Laterality: Right;  RIGHT TWO TO THREE HAMMER TOE CORRECTION  . HERNIA REPAIR    . HIATAL HERNIA REPAIR  2005   [place]  . HIATAL HERNIA REPAIR  2007   Lap with MTF bridge mesh.  Dr Hassell Done,   . LAPAROSCOPIC INCISIONAL / UMBILICAL / VENTRAL HERNIA REPAIR  11/2010   lap VWH with mesh  . LEFT COLECTOMY  2010   with colostomy for perforated diverticulitis  . TENDON TRANSFER Right 06/14/2016   Procedure: FLEXOR TO EXTENSOR TRANSFER;  Surgeon: Wylene Simmer, MD;  Location: [place];  Service: Orthopedics;  Laterality: Right;  FLEXOR TO EXTENSOR TRANSFER  . VENTRAL HERNIA REPAIR  09/06/2010  . WEIL OSTEOTOMY Right 06/14/2016   Procedure: TWO TO THREE METATARSAL WEIL OSTEOTOMY;  Surgeon: Wylene Simmer, MD;  Location: [place];  Service: Orthopedics;  Laterality: Right;  TWO TO THREE METATARSAL WEIL OSTEOTOMY    Current Medications: Current Meds  Medication Sig  . acetaminophen (TYLENOL) 500 MG tablet Take 1,000 mg by mouth every 6 (six) hours as needed for pain.  Marland Kitchen amitriptyline (ELAVIL) 25 MG tablet Take 1-2 tablets by mouth at bedtime.  Marland Kitchen aspirin EC 81 MG tablet Take 1 tablet (81 mg  total) by mouth daily.  Marland Kitchen atorvastatin (LIPITOR) 10 MG tablet Take 1 tablet (10 mg total) by mouth daily.  . calcium-vitamin D (OSCAL WITH D) 500-200 MG-UNIT per tablet Take 1 tablet by mouth 2 (two) times daily.  Marland Kitchen dicyclomine (BENTYL) 20 MG tablet Take 20 mg by mouth 4 (four) times daily as needed for spasms.   . hydrochlorothiazide (HYDRODIURIL) 25 MG tablet Take 1 tablet (25 mg total) by mouth daily.  . Lactobacillus (ACIDOPHILUS PO) Take 1 capsule by mouth daily.   Marland Kitchen loratadine (CLARITIN) 10 MG tablet Take 10 mg by mouth daily as needed for allergies.   . metoprolol succinate (TOPROL-XL) 50 MG 24 hr tablet Take one and one half tablet (75 mg ) daily  . Multiple Vitamins-Minerals (MULTIVITAMIN WITH MINERALS) tablet Take 1 tablet by mouth daily.   . Multiple Vitamins-Minerals (PRESERVISION AREDS 2) CAPS Take 2 tablets by mouth daily.  . polyethylene glycol (MIRALAX / GLYCOLAX) packet Take 17 g by mouth every other day.   . traMADol (ULTRAM) 50 MG tablet Take by mouth every 12 (twelve) hours as needed for moderate pain.   Marland Kitchen venlafaxine XR (EFFEXOR-XR) 75 MG 24 hr capsule Take 75 mg by mouth daily with breakfast.      Allergies:   Adhesive [tape]   Social History   Socioeconomic History  . Marital status: Widowed    Spouse name: Not on file  . Number of children: Not on file  . Years of education: Not on file  . Highest education level: Not on file  Occupational History  . Not on file  Tobacco Use  . Smoking status: Never Smoker  . Smokeless tobacco: Never Used  Substance and Sexual Activity  . Alcohol use: Yes    Comment: social  . Drug use: No  . Sexual activity: Never  Other Topics Concern  . Not on file  Social History Narrative  . Not on file   Social Determinants of Health   Financial Resource Strain:   . Difficulty of Paying Living Expenses:   Food Insecurity:   . Worried About Charity fundraiser in the Last Year:   . Arboriculturist in the Last Year:     Transportation Needs:   . Film/video editor (Medical):   Marland Kitchen Lack of Transportation (Non-Medical):   Physical Activity:   . Days of Exercise per Week:   . Minutes of Exercise per Session:   Stress:   . Feeling of Stress :   Social Connections:   . Frequency of Communication with Friends and Family:   . Frequency of Social Gatherings with Friends and Family:   . Attends Religious Services:   . Active Member of Clubs or Organizations:   . Attends Archivist Meetings:   Marland Kitchen Marital Status:  Family History: The patient's family history includes Heart attack in her brother; Heart disease in her maternal grandmother and mother.  ROS:   Please see the history of present illness.    All other systems reviewed and are negative.  EKGs/Labs/Other Studies Reviewed:    The following studies were reviewed today:  EKG:  EKG is ordered today.  The ekg ordered today demonstrates normal sinus rhythm, nonspecific ST-T wave abnormalities, unchanged from prior.  This was personally reviewed.  Recent Labs: No results found for requested labs within last 8760 hours.  Recent Lipid Panel No results found for: CHOL, TRIG, HDL, CHOLHDL, VLDL, LDLCALC, LDLDIRECT  Physical Exam:    VS:  BP (!) 148/82   Pulse 79   Ht 5' 1.5" (1.562 m)   Wt 161 lb 12.8 oz (73.4 kg)   SpO2 96%   BMI 30.08 kg/m     Wt Readings from Last 3 Encounters:  03/02/20 161 lb 12.8 oz (73.4 kg)  11/20/19 158 lb (71.7 kg)  05/19/19 158 lb (71.7 kg)     GEN:  Well nourished, well developed in no acute distress HEENT: Normal NECK: No JVD; No carotid bruits LYMPHATICS: No lymphadenopathy CARDIAC: RRR, no murmurs, rubs, gallops RESPIRATORY:  Clear to auscultation without rales, wheezing or rhonchi  ABDOMEN: Soft, non-tender, non-distended MUSCULOSKELETAL:  No edema; No deformity  SKIN: Warm and dry NEUROLOGIC:  Alert and oriented x 3 PSYCHIATRIC:  Normal affect   ASSESSMENT:    1. Essential  hypertension   2. Family history of early CAD   3. Hyperlipidemia, unspecified hyperlipidemia type   4. Bilateral carotid artery stenosis    PLAN:    In order of problems listed above:  1. Essential hypertension -she is advised low-sodium diet, I will add losartan 25 mg to her regimen. 2. Lower extremity edema -probably multifactorial secondary to venous insufficiency and sedentary lifestyle, this has improved after adding hydrochlorothiazide 25 mg daily. 3. Palpitations improved on metoprolol. 4. History of dyspnea on exertion with normal coronary CTA in 2018 5. Right carotid bruit -recent carotid ultrasound in October 2020 showed bilateral 40 to 59% carotid stenosis.  Will repeat in 12 months. 6. Family history of early CAD -she is on atorvastatin 10 mg daily with lipids at goal.  Also score in 2017 was 1.  Medication Adjustments/Labs and Tests Ordered: Current medicines are reviewed at length with the patient today.  Concerns regarding medicines are outlined above.  Orders Placed This Encounter  Procedures  . CBC  . Comp Met (CMET)  . TSH  . EKG 12-Lead   Meds ordered this encounter  Medications  . losartan (COZAAR) 25 MG tablet    Sig: Take 1 tablet (25 mg total) by mouth daily.    Dispense:  90 tablet    Refill:  1    Patient Instructions  Medication Instructions:   START TAKING LOSARTAN 25 MG BY MOUTH DAILY  *If you need a refill on your cardiac medications before your next appointment, please call your pharmacy*   Lab Work:  TODAY--CMET, CBC, AND TSH  If you have labs (blood work) drawn today and your tests are completely normal, you will receive your results only by: Marland Kitchen MyChart Message (if you have MyChart) OR . A paper copy in the mail If you have any lab test that is abnormal or we need to change your treatment, we will call you to review the results.   Follow-Up: At [facility], you and your health needs  are our priority.  As part of our continuing  mission to provide you with exceptional heart care, we have created designated Provider Care Teams.  These Care Teams include your primary Cardiologist (physician) and Advanced Practice Providers (APPs -  Physician Assistants and Nurse Practitioners) who all work together to provide you with the care you need, when you need it.  We recommend signing up for the patient portal called "MyChart".  Sign up information is provided on this After Visit Summary.  MyChart is used to connect with patients for Virtual Visits (Telemedicine).  Patients are able to view lab/test results, encounter notes, upcoming appointments, etc.  Non-urgent messages can be sent to your provider as well.   To learn more about what you can do with MyChart, go to [email or web address].    Your next appointment:   6 month(s)  The format for your next appointment:   In Person  Provider:   Ena Dawley, MD        Signed, Ena Dawley, MD  03/02/2020 10:48 AM    [place]

## 2020-03-02 NOTE — Patient Instructions (Signed)
Medication Instructions:   START TAKING LOSARTAN 25 MG BY MOUTH DAILY  *If you need a refill on your cardiac medications before your next appointment, please call your pharmacy*   Lab Work:  TODAY--CMET, CBC, AND TSH  If you have labs (blood work) drawn today and your tests are completely normal, you will receive your results only by: Marland Kitchen MyChart Message (if you have MyChart) OR . A paper copy in the mail If you have any lab test that is abnormal or we need to change your treatment, we will call you to review the results.   Follow-Up: At [facility], you and your health needs are our priority.  As part of our continuing mission to provide you with exceptional heart care, we have created designated Provider Care Teams.  These Care Teams include your primary Cardiologist (physician) and Advanced Practice Providers (APPs -  Physician Assistants and Nurse Practitioners) who all work together to provide you with the care you need, when you need it.  We recommend signing up for the patient portal called "MyChart".  Sign up information is provided on this After Visit Summary.  MyChart is used to connect with patients for Virtual Visits (Telemedicine).  Patients are able to view lab/test results, encounter notes, upcoming appointments, etc.  Non-urgent messages can be sent to your provider as well.   To learn more about what you can do with MyChart, go to [email or web address].    Your next appointment:   6 month(s)  The format for your next appointment:   In Person  Provider:   Tobias Alexander, MD

## 2020-04-08 DIAGNOSIS — Z0000 Encounter for general adult medical examination without abnormal findings: Secondary | ICD-10-CM | POA: Diagnosis not present

## 2020-04-08 DIAGNOSIS — K219 Gastro-esophageal reflux disease without esophagitis: Secondary | ICD-10-CM | POA: Diagnosis not present

## 2020-04-08 DIAGNOSIS — K6389 Other specified diseases of intestine: Secondary | ICD-10-CM | POA: Diagnosis not present

## 2020-04-08 DIAGNOSIS — I10 Essential (primary) hypertension: Secondary | ICD-10-CM | POA: Diagnosis not present

## 2020-04-08 DIAGNOSIS — Z1211 Encounter for screening for malignant neoplasm of colon: Secondary | ICD-10-CM | POA: Diagnosis not present

## 2020-04-08 DIAGNOSIS — K589 Irritable bowel syndrome without diarrhea: Secondary | ICD-10-CM | POA: Diagnosis not present

## 2020-04-08 DIAGNOSIS — F419 Anxiety disorder, unspecified: Secondary | ICD-10-CM | POA: Diagnosis not present

## 2020-04-08 DIAGNOSIS — E785 Hyperlipidemia, unspecified: Secondary | ICD-10-CM | POA: Diagnosis not present

## 2020-04-15 DIAGNOSIS — Z1211 Encounter for screening for malignant neoplasm of colon: Secondary | ICD-10-CM | POA: Diagnosis not present

## 2020-04-22 DIAGNOSIS — M25561 Pain in right knee: Secondary | ICD-10-CM | POA: Diagnosis not present

## 2020-04-22 DIAGNOSIS — M1711 Unilateral primary osteoarthritis, right knee: Secondary | ICD-10-CM | POA: Diagnosis not present

## 2020-05-14 ENCOUNTER — Other Ambulatory Visit: Payer: Self-pay | Admitting: Cardiology

## 2020-05-26 DIAGNOSIS — M1711 Unilateral primary osteoarthritis, right knee: Secondary | ICD-10-CM | POA: Diagnosis not present

## 2020-05-26 DIAGNOSIS — M25561 Pain in right knee: Secondary | ICD-10-CM | POA: Diagnosis not present

## 2020-06-02 DIAGNOSIS — M1711 Unilateral primary osteoarthritis, right knee: Secondary | ICD-10-CM | POA: Diagnosis not present

## 2020-06-02 DIAGNOSIS — M25561 Pain in right knee: Secondary | ICD-10-CM | POA: Diagnosis not present

## 2020-06-09 DIAGNOSIS — M25561 Pain in right knee: Secondary | ICD-10-CM | POA: Diagnosis not present

## 2020-06-09 DIAGNOSIS — M1711 Unilateral primary osteoarthritis, right knee: Secondary | ICD-10-CM | POA: Diagnosis not present

## 2020-08-01 ENCOUNTER — Other Ambulatory Visit: Payer: Self-pay | Admitting: Family Medicine

## 2020-08-01 DIAGNOSIS — Z1231 Encounter for screening mammogram for malignant neoplasm of breast: Secondary | ICD-10-CM

## 2020-08-09 ENCOUNTER — Ambulatory Visit
Admission: RE | Admit: 2020-08-09 | Discharge: 2020-08-09 | Disposition: A | Discharge status: Home / Self Care | Payer: PPO | Source: Ambulatory Visit | Attending: Family Medicine | Admitting: Family Medicine

## 2020-08-09 ENCOUNTER — Other Ambulatory Visit: Payer: Self-pay

## 2020-08-09 DIAGNOSIS — Z1231 Encounter for screening mammogram for malignant neoplasm of breast: Secondary | ICD-10-CM | POA: Diagnosis not present

## 2020-08-22 ENCOUNTER — Other Ambulatory Visit: Payer: Self-pay | Admitting: Cardiology

## 2020-08-22 DIAGNOSIS — Z8249 Family history of ischemic heart disease and other diseases of the circulatory system: Secondary | ICD-10-CM

## 2020-08-22 DIAGNOSIS — E785 Hyperlipidemia, unspecified: Secondary | ICD-10-CM

## 2020-08-22 DIAGNOSIS — I6523 Occlusion and stenosis of bilateral carotid arteries: Secondary | ICD-10-CM

## 2020-08-22 DIAGNOSIS — I10 Essential (primary) hypertension: Secondary | ICD-10-CM

## 2020-08-30 ENCOUNTER — Telehealth: Payer: Self-pay | Admitting: *Deleted

## 2020-08-30 DIAGNOSIS — Z23 Encounter for immunization: Secondary | ICD-10-CM | POA: Diagnosis not present

## 2020-08-30 NOTE — Telephone Encounter (Signed)
Left message regarding time change for Carotid doppler scheduled Tuesday 09/13/20----changed from 3:00pm to 2:00pm---requested return confirmation call from patient

## 2020-09-01 NOTE — Telephone Encounter (Signed)
Spoke with patient regarding appointment time change for Carotid doppler on 09/13/20---changed from 3:00pm to 2:00pm----patient voiced her understanding.

## 2020-09-10 ENCOUNTER — Other Ambulatory Visit: Payer: Self-pay | Admitting: Physician Assistant

## 2020-09-13 ENCOUNTER — Ambulatory Visit (HOSPITAL_COMMUNITY)
Admission: RE | Admit: 2020-09-13 | Discharge: 2020-09-13 | Disposition: A | Discharge status: Home / Self Care | Payer: PPO | Source: Ambulatory Visit | Attending: Cardiovascular Disease | Admitting: Cardiovascular Disease

## 2020-09-13 ENCOUNTER — Other Ambulatory Visit: Payer: Self-pay | Admitting: Physician Assistant

## 2020-09-13 ENCOUNTER — Other Ambulatory Visit: Payer: Self-pay

## 2020-09-13 ENCOUNTER — Encounter (HOSPITAL_COMMUNITY): Payer: PPO

## 2020-09-13 DIAGNOSIS — I6523 Occlusion and stenosis of bilateral carotid arteries: Secondary | ICD-10-CM | POA: Diagnosis not present

## 2020-09-15 ENCOUNTER — Other Ambulatory Visit: Payer: Self-pay

## 2020-09-15 MED ORDER — ATORVASTATIN CALCIUM 20 MG PO TABS: 20.0000 mg | ORAL_TABLET | Freq: Every day | ORAL | 3 refills | Status: DC

## 2020-11-11 ENCOUNTER — Other Ambulatory Visit: Payer: Self-pay | Admitting: Cardiology

## 2021-01-05 DIAGNOSIS — Z03818 Encounter for observation for suspected exposure to other biological agents ruled out: Secondary | ICD-10-CM | POA: Diagnosis not present

## 2021-01-09 DIAGNOSIS — Z03818 Encounter for observation for suspected exposure to other biological agents ruled out: Secondary | ICD-10-CM | POA: Diagnosis not present

## 2021-02-10 DIAGNOSIS — H2513 Age-related nuclear cataract, bilateral: Secondary | ICD-10-CM | POA: Diagnosis not present

## 2021-02-10 DIAGNOSIS — H33302 Unspecified retinal break, left eye: Secondary | ICD-10-CM | POA: Diagnosis not present

## 2021-02-10 DIAGNOSIS — H40013 Open angle with borderline findings, low risk, bilateral: Secondary | ICD-10-CM | POA: Diagnosis not present

## 2021-02-10 DIAGNOSIS — H524 Presbyopia: Secondary | ICD-10-CM | POA: Diagnosis not present

## 2021-02-14 DIAGNOSIS — F331 Major depressive disorder, recurrent, moderate: Secondary | ICD-10-CM | POA: Diagnosis not present

## 2021-02-14 DIAGNOSIS — F419 Anxiety disorder, unspecified: Secondary | ICD-10-CM | POA: Diagnosis not present

## 2021-02-16 ENCOUNTER — Other Ambulatory Visit: Payer: Self-pay | Admitting: Cardiology

## 2021-02-16 ENCOUNTER — Other Ambulatory Visit: Payer: Self-pay | Admitting: Physician Assistant

## 2021-02-16 DIAGNOSIS — Z8249 Family history of ischemic heart disease and other diseases of the circulatory system: Secondary | ICD-10-CM

## 2021-02-16 DIAGNOSIS — I6523 Occlusion and stenosis of bilateral carotid arteries: Secondary | ICD-10-CM

## 2021-02-16 DIAGNOSIS — I10 Essential (primary) hypertension: Secondary | ICD-10-CM

## 2021-02-16 DIAGNOSIS — E785 Hyperlipidemia, unspecified: Secondary | ICD-10-CM

## 2021-03-14 DIAGNOSIS — F419 Anxiety disorder, unspecified: Secondary | ICD-10-CM | POA: Diagnosis not present

## 2021-03-19 ENCOUNTER — Other Ambulatory Visit: Payer: Self-pay | Admitting: Cardiology

## 2021-03-19 DIAGNOSIS — I10 Essential (primary) hypertension: Secondary | ICD-10-CM

## 2021-03-19 DIAGNOSIS — Z8249 Family history of ischemic heart disease and other diseases of the circulatory system: Secondary | ICD-10-CM

## 2021-03-19 DIAGNOSIS — I6523 Occlusion and stenosis of bilateral carotid arteries: Secondary | ICD-10-CM

## 2021-03-19 DIAGNOSIS — E785 Hyperlipidemia, unspecified: Secondary | ICD-10-CM

## 2021-03-21 ENCOUNTER — Other Ambulatory Visit: Payer: Self-pay | Admitting: Cardiology

## 2021-03-21 DIAGNOSIS — I6523 Occlusion and stenosis of bilateral carotid arteries: Secondary | ICD-10-CM

## 2021-03-21 DIAGNOSIS — E785 Hyperlipidemia, unspecified: Secondary | ICD-10-CM

## 2021-03-21 DIAGNOSIS — I10 Essential (primary) hypertension: Secondary | ICD-10-CM

## 2021-03-21 DIAGNOSIS — Z8249 Family history of ischemic heart disease and other diseases of the circulatory system: Secondary | ICD-10-CM

## 2021-04-11 DIAGNOSIS — R1032 Left lower quadrant pain: Secondary | ICD-10-CM | POA: Diagnosis not present

## 2021-04-11 DIAGNOSIS — R197 Diarrhea, unspecified: Secondary | ICD-10-CM | POA: Diagnosis not present

## 2021-04-11 DIAGNOSIS — K6389 Other specified diseases of intestine: Secondary | ICD-10-CM | POA: Diagnosis not present

## 2021-04-11 DIAGNOSIS — R140 Abdominal distension (gaseous): Secondary | ICD-10-CM | POA: Diagnosis not present

## 2021-04-17 ENCOUNTER — Other Ambulatory Visit: Payer: Self-pay | Admitting: Cardiology

## 2021-04-17 DIAGNOSIS — I10 Essential (primary) hypertension: Secondary | ICD-10-CM

## 2021-04-17 DIAGNOSIS — Z8249 Family history of ischemic heart disease and other diseases of the circulatory system: Secondary | ICD-10-CM

## 2021-04-17 DIAGNOSIS — I6523 Occlusion and stenosis of bilateral carotid arteries: Secondary | ICD-10-CM

## 2021-04-17 DIAGNOSIS — E785 Hyperlipidemia, unspecified: Secondary | ICD-10-CM

## 2021-04-24 DIAGNOSIS — K219 Gastro-esophageal reflux disease without esophagitis: Secondary | ICD-10-CM | POA: Diagnosis not present

## 2021-04-24 DIAGNOSIS — E785 Hyperlipidemia, unspecified: Secondary | ICD-10-CM | POA: Diagnosis not present

## 2021-04-24 DIAGNOSIS — K589 Irritable bowel syndrome without diarrhea: Secondary | ICD-10-CM | POA: Diagnosis not present

## 2021-04-24 DIAGNOSIS — I10 Essential (primary) hypertension: Secondary | ICD-10-CM | POA: Diagnosis not present

## 2021-04-24 DIAGNOSIS — K449 Diaphragmatic hernia without obstruction or gangrene: Secondary | ICD-10-CM | POA: Diagnosis not present

## 2021-04-24 DIAGNOSIS — Z1211 Encounter for screening for malignant neoplasm of colon: Secondary | ICD-10-CM | POA: Diagnosis not present

## 2021-04-24 DIAGNOSIS — M8580 Other specified disorders of bone density and structure, unspecified site: Secondary | ICD-10-CM | POA: Diagnosis not present

## 2021-04-24 DIAGNOSIS — K6389 Other specified diseases of intestine: Secondary | ICD-10-CM | POA: Diagnosis not present

## 2021-04-24 DIAGNOSIS — Z0000 Encounter for general adult medical examination without abnormal findings: Secondary | ICD-10-CM | POA: Diagnosis not present

## 2021-04-25 ENCOUNTER — Other Ambulatory Visit: Payer: Self-pay | Admitting: Family Medicine

## 2021-04-25 DIAGNOSIS — E2839 Other primary ovarian failure: Secondary | ICD-10-CM

## 2021-05-04 ENCOUNTER — Other Ambulatory Visit: Payer: Self-pay | Admitting: *Deleted

## 2021-05-04 DIAGNOSIS — I872 Venous insufficiency (chronic) (peripheral): Secondary | ICD-10-CM

## 2021-05-11 ENCOUNTER — Other Ambulatory Visit: Payer: Self-pay

## 2021-05-11 MED ORDER — HYDROCHLOROTHIAZIDE 25 MG PO TABS: 25.0000 mg | ORAL_TABLET | Freq: Every day | ORAL | 0 refills | Status: DC

## 2021-05-21 NOTE — Progress Notes (Signed)
[facility] OF [place]  ASSESSMENT / PLAN: Carol Roberts is a 75 y.o. female with varicosities, reticular veins and telangectasias of bilateral lower extremities. C stage 1. Patient counseled extensively about the natural history of lower extremity venous disease. She is not interested in intervention. Encouraged compression / elevation as able. Follow up with PA as needed.  CHIEF COMPLAINT: varicose veins  HISTORY OF PRESENT ILLNESS: Carol Roberts is a 75 y.o. female with a long history of reticular veins and telangiectasias about the feet and calves bilaterally.  These have been present for greater than 25 years.  She had a reflux study at that time which showed saphenous vein incompetence.  She has throbbing pain at night about her ankles.  She has osteoarthritis of the feet and right knee.  She has valgus deformity of the right knee and is anticipating she will need a knee replacement at some point in her life.  She will occasionally swell in her lower extremities.  Past Medical History:  Diagnosis Date   Anemia    "off and on"   Arthritis    knees, hands, hips - hx of dislocated hip many yrs ago    Fibromyalgia    GERD (gastroesophageal reflux disease)    H/O hiatal hernia    paraesophageal hernia, had surgery to repair   Headache(784.0)    ocas migraines   Paraesophageal hiatal hernia    causes vomiting, chest and left shoulder pain, sometimes left jaw pain    Past Surgical History:  Procedure Laterality Date   2011 RIGHT KNEE ARTHROSCOPY     APPENDECTOMY     BREAST EXCISIONAL BIOPSY Left 1987   BUNIONECTOMY Right 06/14/2016   Procedure: RIGHT LAPIDUS AND MODIFIED MCBRIDE BUNIONECTOMY;  Surgeon: Toni Arthurs, MD;  Location: [facility];  Service: Orthopedics;  Laterality: Right;  RIGHT LAPIDUS AND MODIFIED MCBRIDE BUNIONECTOMY   COLON SURGERY     bowel resection for diverticulitis   COLOSTOMY CLOSURE  2010   ESOPHAGEAL MANOMETRY N/A  06/08/2013   Procedure: ESOPHAGEAL MANOMETRY (EM);  Surgeon: Graylin Shiver, MD;  Location: [facility];  Service: Endoscopy;  Laterality: N/A;  Dr. Bosie Clos will read Manometry for Dr. Neita Garnet TOE SURGERY Right 06/14/2016   Procedure: RIGHT TWO TO THREE HAMMER TOE CORRECTION;  Surgeon: Toni Arthurs, MD;  Location: [facility];  Service: Orthopedics;  Laterality: Right;  RIGHT TWO TO THREE HAMMER TOE CORRECTION   HERNIA REPAIR     HIATAL HERNIA REPAIR  2005   [place]   HIATAL HERNIA REPAIR  2007   Lap with MTF bridge mesh.  Dr Daphine Deutscher,    LAPAROSCOPIC INCISIONAL / UMBILICAL / VENTRAL HERNIA REPAIR  11/2010   lap VWH with mesh   LEFT COLECTOMY  2010   with colostomy for perforated diverticulitis   TENDON TRANSFER Right 06/14/2016   Procedure: FLEXOR TO EXTENSOR TRANSFER;  Surgeon: Toni Arthurs, MD;  Location: [facility];  Service: Orthopedics;  Laterality: Right;  FLEXOR TO EXTENSOR TRANSFER   VENTRAL HERNIA REPAIR  09/06/2010   WEIL OSTEOTOMY Right 06/14/2016   Procedure: TWO TO THREE METATARSAL WEIL OSTEOTOMY;  Surgeon: Toni Arthurs, MD;  Location:  SURGERY CENTER;  Service: Orthopedics;  Laterality: Right;  TWO TO THREE METATARSAL WEIL OSTEOTOMY    Family History  Problem Relation Age of Onset   Heart disease Mother    Heart attack Brother    Heart disease Maternal Grandmother  Social History   Socioeconomic History   Marital status: Widowed    Spouse name: Not on file   Number of children: Not on file   Years of education: Not on file   Highest education level: Not on file  Occupational History   Not on file  Tobacco Use   Smoking status: Never   Smokeless tobacco: Never  Vaping Use   Vaping Use: Never used  Substance and Sexual Activity   Alcohol use: Yes    Comment: social   Drug use: No   Sexual activity: Never  Other Topics Concern   Not on file  Social History Narrative   Not on file   Social Determinants of Health    Financial Resource Strain: Not on file  Food Insecurity: Not on file  Transportation Needs: Not on file  Physical Activity: Not on file  Stress: Not on file  Social Connections: Not on file  Intimate Partner Violence: Not on file    Allergies  Allergen Reactions   Adhesive [Tape] Rash    Current Outpatient Medications  Medication Sig Dispense Refill   acetaminophen (TYLENOL) 500 MG tablet Take 1,000 mg by mouth every 6 (six) hours as needed for pain.     amitriptyline (ELAVIL) 25 MG tablet Take 1-2 tablets by mouth at bedtime.     aspirin EC 81 MG tablet Take 1 tablet (81 mg total) by mouth daily.     atorvastatin (LIPITOR) 20 MG tablet Take 1 tablet (20 mg total) by mouth daily. 90 tablet 3   calcium-vitamin D (OSCAL WITH D) 500-200 MG-UNIT per tablet Take 1 tablet by mouth 2 (two) times daily.     dicyclomine (BENTYL) 20 MG tablet Take 20 mg by mouth 4 (four) times daily as needed for spasms.      hydrochlorothiazide (HYDRODIURIL) 25 MG tablet Take 1 tablet (25 mg total) by mouth daily. 90 tablet 0   Lactobacillus (ACIDOPHILUS PO) Take 1 capsule by mouth daily.      loratadine (CLARITIN) 10 MG tablet Take 10 mg by mouth daily as needed for allergies.      losartan (COZAAR) 25 MG tablet Take 1 tablet (25 mg total) by mouth daily. Please keep upcoming appt in July 2022 with Dr. Shari Prows before anymore refills. Thank you 90 tablet 0   metoprolol succinate (TOPROL-XL) 50 MG 24 hr tablet TAKE 1 AND 1/2 TABLETS BY MOUTH DAILY. Please keep upcoming appt in July 2022 with Dr. Shari Prows before anymore refills. Thank you 135 tablet 0   Multiple Vitamins-Minerals (MULTIVITAMIN WITH MINERALS) tablet Take 1 tablet by mouth daily.      Multiple Vitamins-Minerals (PRESERVISION AREDS 2) CAPS Take 2 tablets by mouth daily.     polyethylene glycol (MIRALAX / GLYCOLAX) packet Take 17 g by mouth every other day.      traMADol (ULTRAM) 50 MG tablet Take by mouth every 12 (twelve) hours as needed for  moderate pain.      venlafaxine XR (EFFEXOR-XR) 75 MG 24 hr capsule Take 75 mg by mouth daily with breakfast.      No current facility-administered medications for this visit.    REVIEW OF SYSTEMS:  [X]  denotes positive finding, [ ]  denotes negative finding Cardiac  Comments:  Chest pain or chest pressure:    Shortness of breath upon exertion:    Short of breath when lying flat:    Irregular heart rhythm:        Vascular    Pain in calf,  thigh, or hip brought on by ambulation:    Pain in feet at night that wakes you up from your sleep:     Blood clot in your veins:    Leg swelling:         Pulmonary    Oxygen at home:    Productive cough:     Wheezing:         Neurologic    Sudden weakness in arms or legs:     Sudden numbness in arms or legs:     Sudden onset of difficulty speaking or slurred speech:    Temporary loss of vision in one eye:     Problems with dizziness:         Gastrointestinal    Blood in stool:     Vomited blood:         Genitourinary    Burning when urinating:     Blood in urine:        Psychiatric    Major depression:         Hematologic    Bleeding problems:    Problems with blood clotting too easily:        Skin    Rashes or ulcers:        Constitutional    Fever or chills:      PHYSICAL EXAM  Vitals:   05/23/21 0840  BP: 128/61  Pulse: 61  Resp: 20  Temp: 98.3 F (36.8 C)  SpO2: 95%  Weight: 156 lb (70.8 kg)  Height: 5' 1.5" (1.562 m)    Constitutional: well appearing. no distress. Appears well nourished.  Neurologic: CN intact. no focal findings. no sensory loss. Psychiatric: Mood and affect symmetric and appropriate. Eyes: No icterus. No conjunctival pallor. Ears, nose, throat: mucous membranes moist. Midline trachea.  Cardiac: regular rate and rhythm.  Respiratory: unlabored. Abdominal: soft, non-tender, non-distended.  Peripheral vascular:  1+ DP pulse bilaterally  Telangactasias, reticular veins scattered across  the lower extremities.  No large varicosities. No varicosities in the distribution of GSV, SSV, or AASV Extremity: No edema. No cyanosis. No pallor.  Skin: No gangrene. No ulceration.  Lymphatic: No Stemmer's sign. No palpable lymphadenopathy.  PERTINENT LABORATORY AND RADIOLOGIC DATA  Most recent CBC CBC Latest Ref Rng & Units 03/02/2020 09/14/2016 05/03/2014  WBC 3.4 - 10.8 x10E3/uL 5.3 5.8 3.9(L)  Hemoglobin 11.1 - 15.9 g/dL 06.2 69.4 11.0(L)  Hematocrit 34.0 - 46.6 % 39.6 37.3 32.7(L)  Platelets 150 - 450 x10E3/uL 215 248 170     Most recent CMP CMP Latest Ref Rng & Units 03/02/2020 11/01/2017 09/14/2016  Glucose 65 - 99 mg/dL 84 - 88  BUN 8 - 27 mg/dL 16 - 19  Creatinine 8.54 - 1.00 mg/dL 6.27 0.35 0.09  Sodium 134 - 144 mmol/L 137 - 138  Potassium 3.5 - 5.2 mmol/L 4.0 - 4.8  Chloride 96 - 106 mmol/L 98 - 101  CO2 20 - 29 mmol/L 27 - 27  Calcium 8.7 - 10.3 mg/dL 38.1 - 9.6  Total Protein 6.0 - 8.5 g/dL 6.7 - -  Total Bilirubin 0.0 - 1.2 mg/dL 0.5 - -  Alkaline Phos 39 - 117 IU/L 54 - -  AST 0 - 40 IU/L 23 - -  ALT 0 - 32 IU/L 18 - -    Hgb A1c MFr Bld (%)  Date Value  03/29/2014 5.3   Vascular Imaging: Venous Reflux Study 05/23/21   Rande Brunt. Lenell Antu, MD [facility] Office Phone Number: [phone] 05/21/2021 1:09 PM

## 2021-05-23 ENCOUNTER — Ambulatory Visit: Payer: PPO | Admitting: Vascular Surgery

## 2021-05-23 ENCOUNTER — Encounter: Payer: Self-pay | Admitting: Vascular Surgery

## 2021-05-23 ENCOUNTER — Other Ambulatory Visit: Payer: Self-pay

## 2021-05-23 ENCOUNTER — Ambulatory Visit (HOSPITAL_COMMUNITY)
Admission: RE | Admit: 2021-05-23 | Discharge: 2021-05-23 | Disposition: A | Discharge status: Home / Self Care | Payer: PPO | Source: Ambulatory Visit | Attending: Vascular Surgery | Admitting: Vascular Surgery

## 2021-05-23 VITALS — BP 128/61 | HR 61 | Temp 98.3°F | Resp 20 | Ht 61.5 in | Wt 156.0 lb

## 2021-05-23 DIAGNOSIS — I872 Venous insufficiency (chronic) (peripheral): Secondary | ICD-10-CM

## 2021-05-23 DIAGNOSIS — I8393 Asymptomatic varicose veins of bilateral lower extremities: Secondary | ICD-10-CM

## 2021-06-22 NOTE — Progress Notes (Signed)
Cardiology Office Note:    Date:  06/26/2021   ID:  Carol Roberts, DOB [birth year], MRN [id number]  PCP:  Shirlean Mylar, MD   [facility] Providers Cardiologist:  Tobias Alexander, MD (Inactive) {  Referring MD: Shirlean Mylar, MD   History of Present Illness:    Carol Roberts is a 75 y.o. female with a hx of anemia, HTN, mild carotid artery disease, palpitations, and family history of premature CAD who was previously followed by Dr. Delton See who now presents to clinic for follow-up.  Per review of the record, coronary CTA 10/2017 with Ca score 1, no obstructive disease. 30 day monitor without arrhythmias.   Last saw Dr. Delton See in 02/2020 where she was doing well. Blood pressure was elevated at that time and she was started on losartan.  Today, the patient states she feels overall well. Blood pressure is well controlled at home running 120s/70s. No lightheadedness, dizziness, syncope. Has occasional "flutters" but this is well controlled with metoprolol. No SOB, nausea or diaphoresis. Tolerating all medications and taking them as prescribed. Works in [employer], [employer]ellery for a lab. Not as active as she would like to but no exertional symptoms.  Past Medical History:  Diagnosis Date   Anemia    "off and on"   Arthritis    knees, hands, hips - hx of dislocated hip many yrs ago    Fibromyalgia    GERD (gastroesophageal reflux disease)    H/O hiatal hernia    paraesophageal hernia, had surgery to repair   Headache(784.0)    ocas migraines   Paraesophageal hiatal hernia    causes vomiting, chest and left shoulder pain, sometimes left jaw pain    Past Surgical History:  Procedure Laterality Date   2011 RIGHT KNEE ARTHROSCOPY     APPENDECTOMY     BREAST EXCISIONAL BIOPSY Left 1987   BUNIONECTOMY Right 06/14/2016   Procedure: RIGHT LAPIDUS AND MODIFIED MCBRIDE BUNIONECTOMY;  Surgeon: Toni Arthurs, MD;  Location: [facility];  Service: Orthopedics;  Laterality: Right;  RIGHT  LAPIDUS AND MODIFIED MCBRIDE BUNIONECTOMY   COLON SURGERY     bowel resection for diverticulitis   COLOSTOMY CLOSURE  2010   ESOPHAGEAL MANOMETRY N/A 06/08/2013   Procedure: ESOPHAGEAL MANOMETRY (EM);  Surgeon: Graylin Shiver, MD;  Location: [facility];  Service: Endoscopy;  Laterality: N/A;  Dr. Bosie Clos will read Manometry for Dr. Neita Garnet TOE SURGERY Right 06/14/2016   Procedure: RIGHT TWO TO THREE HAMMER TOE CORRECTION;  Surgeon: Toni Arthurs, MD;  Location: [facility];  Service: Orthopedics;  Laterality: Right;  RIGHT TWO TO THREE HAMMER TOE CORRECTION   HERNIA REPAIR     HIATAL HERNIA REPAIR  2005   [place]   HIATAL HERNIA REPAIR  2007   Lap with MTF bridge mesh.  Dr Daphine Deutscher,    LAPAROSCOPIC INCISIONAL / UMBILICAL / VENTRAL HERNIA REPAIR  11/2010   lap VWH with mesh   LEFT COLECTOMY  2010   with colostomy for perforated diverticulitis   TENDON TRANSFER Right 06/14/2016   Procedure: FLEXOR TO EXTENSOR TRANSFER;  Surgeon: Toni Arthurs, MD;  Location: [facility];  Service: Orthopedics;  Laterality: Right;  FLEXOR TO EXTENSOR TRANSFER   VENTRAL HERNIA REPAIR  09/06/2010   WEIL OSTEOTOMY Right 06/14/2016   Procedure: TWO TO THREE METATARSAL WEIL OSTEOTOMY;  Surgeon: Toni Arthurs, MD;  Location:  SURGERY CENTER;  Service: Orthopedics;  Laterality: Right;  TWO TO THREE METATARSAL  WEIL OSTEOTOMY    Current Medications: Current Meds  Medication Sig   acetaminophen (TYLENOL) 500 MG tablet Take 1,000 mg by mouth every 6 (six) hours as needed for pain.   amitriptyline (ELAVIL) 25 MG tablet Take 1-2 tablets by mouth at bedtime.   aspirin EC 81 MG tablet Take 1 tablet (81 mg total) by mouth daily.   atorvastatin (LIPITOR) 20 MG tablet Take 1 tablet (20 mg total) by mouth daily.   calcium-vitamin D (OSCAL WITH D) 500-200 MG-UNIT per tablet Take 1 tablet by mouth 2 (two) times daily.   dicyclomine (BENTYL) 20 MG tablet Take 20 mg by mouth 4 (four) times  daily as needed for spasms.    hydrochlorothiazide (HYDRODIURIL) 25 MG tablet Take 1 tablet (25 mg total) by mouth daily.   Lactobacillus (ACIDOPHILUS PO) Take 1 capsule by mouth daily.    loratadine (CLARITIN) 10 MG tablet Take 10 mg by mouth daily as needed for allergies.    losartan (COZAAR) 25 MG tablet Take 1 tablet (25 mg total) by mouth daily. Please keep upcoming appt in July 2022 with Dr. Shari Prows before anymore refills. Thank you   metoprolol succinate (TOPROL-XL) 50 MG 24 hr tablet TAKE 1 AND 1/2 TABLETS BY MOUTH DAILY. Please keep upcoming appt in July 2022 with Dr. Shari Prows before anymore refills. Thank you   Multiple Vitamins-Minerals (MULTIVITAMIN WITH MINERALS) tablet Take 1 tablet by mouth daily.    Multiple Vitamins-Minerals (PRESERVISION AREDS 2) CAPS Take 2 tablets by mouth daily.   polyethylene glycol (MIRALAX / GLYCOLAX) packet Take 17 g by mouth every other day.      Allergies:   Adhesive [tape]   Social History   Socioeconomic History   Marital status: Widowed    Spouse name: Not on file   Number of children: Not on file   Years of education: Not on file   Highest education level: Not on file  Occupational History   Not on file  Tobacco Use   Smoking status: Never   Smokeless tobacco: Never  Vaping Use   Vaping Use: Never used  Substance and Sexual Activity   Alcohol use: Yes    Comment: social   Drug use: No   Sexual activity: Never  Other Topics Concern   Not on file  Social History Narrative   Not on file   Social Determinants of Health   Financial Resource Strain: Not on file  Food Insecurity: Not on file  Transportation Needs: Not on file  Physical Activity: Not on file  Stress: Not on file  Social Connections: Not on file     Family History: The patient's family history includes Heart attack in her brother; Heart disease in her maternal grandmother and mother.  ROS:   Please see the history of present illness.    Review of Systems   Constitutional:  Negative for chills and fever.  HENT:  Negative for hearing loss.   Eyes:  Negative for blurred vision and double vision.  Respiratory:  Negative for shortness of breath.   Cardiovascular:  Positive for palpitations. Negative for chest pain, orthopnea, claudication, leg swelling and PND.  Gastrointestinal:  Negative for nausea and vomiting.  Genitourinary:  Negative for dysuria.  Musculoskeletal:  Negative for myalgias.  Neurological:  Negative for dizziness and loss of consciousness.  Psychiatric/Behavioral:  Negative for substance abuse.     EKGs/Labs/Other Studies Reviewed:    The following studies were reviewed today: TTE 04/10/16: Study Conclusions   - Left  ventricle: The cavity size was normal. There was mild    concentric hypertrophy. Systolic function was normal. The    estimated ejection fraction was in the range of 60% to 65%. Wall    motion was normal; there were no regional wall motion    abnormalities. Doppler parameters are consistent with abnormal    left ventricular relaxation (grade 1 diastolic dysfunction).    Doppler parameters are consistent with high ventricular filling    pressure.  - Aortic valve: Transvalvular velocity was within the normal range.    There was no stenosis. There was mild regurgitation.  - Mitral valve: Transvalvular velocity was within the normal range.    There was no evidence for stenosis. There was no regurgitation.  - Left atrium: The atrium was moderately dilated.  - Right ventricle: The cavity size was normal. Wall thickness was    normal. Systolic function was normal.  - Right atrium: The atrium was mildly dilated.  - Atrial septum: No defect or patent foramen ovale was identified    by color flow Doppler.  - Tricuspid valve: There was mild regurgitation.  - Pulmonary arteries: Systolic pressure was within the normal    range. PA peak pressure: 33 mm Hg (S).   Coronary CTA 10/2017: FINDINGS: Non-cardiac: [facility]  [facility]. No significant findings on limited lung and soft tissue windows.   Calcium Score: Very tiny area of punctate calcium seen in proximal LAD and proximal RCA   Coronary Arteries: Right dominant with no anomalies   LM: Normal   LAD:  Less than 30% calcified plaque in proximal LAD   D1:  Normal   D2: Normal   Circumflex:  Normal   OM1:  Small and normal   OM2:  Normal   OM3:  Normal   RCA:  Less than 20% calcified plaque in proximal vessel   PDA: Normal   PLA:  Normal   IMPRESSION: 1) Calcium score 1 which is 43 rd percentile for age and sex   2) No obstructive CAD see description above right dominant   3) Normal aortic root 3.1 cm   4) Mildly dilated main pulmonary artery 2.9 cm  EKG:  EKG is  ordered today.  The ekg ordered today demonstrates NSR with HR 60  Recent Labs: No results found for requested labs within last 8760 hours.  Recent Lipid Panel No results found for: CHOL, TRIG, HDL, CHOLHDL, VLDL, LDLCALC, LDLDIRECT        Physical Exam:    VS:  BP 122/70   Pulse 60   Ht 5' 1.5" (1.562 m)   Wt 157 lb 9.6 oz (71.5 kg)   SpO2 98%   BMI 29.30 kg/m     Wt Readings from Last 3 Encounters:  06/26/21 157 lb 9.6 oz (71.5 kg)  05/23/21 156 lb (70.8 kg)  03/02/20 161 lb 12.8 oz (73.4 kg)     GEN:  Well nourished, well developed in no acute distress HEENT: Normal NECK: No JVD; faint left carotid bruit CARDIAC: RRR, no murmurs, rubs, gallops RESPIRATORY:  Clear to auscultation without rales, wheezing or rhonchi  ABDOMEN: Soft, non-tender, non-distended MUSCULOSKELETAL:  No edema; No deformity  SKIN: Warm and dry NEUROLOGIC:  Alert and oriented x 3 PSYCHIATRIC:  Normal affect   ASSESSMENT:    1. Essential hypertension   2. Bilateral carotid artery stenosis   3. Hyperlipidemia, unspecified hyperlipidemia type   4. Palpitations   5. Leg edema  PLAN:    In order of problems listed  above:  #HTN: Controlled and at goal <120s/80s. -Continue losartan 25mg  daily -Continue HCTZ 25mg  daily -Continue metop 50mg  XL daily  #LE Edema: Controlled on HCTZ. No edema today -Continue HCTZ 25mg  daily  #Palpitations: Monitor without arrhythmias. Has occasional flutters but overall well controlled with metop. -Continue metop 50mg  XL daily  #Mild Carotid Artery Disease: 09/2020 carotids with stable 40-59% narrowing. -Will need repeat monitoring in 09/2021 -Continue atorvastatin 20mg  daily -Continue ASA 81mg  daily  #HLD: LDL 44, HDL 51, TG 69. -Continue atorvastatin 20mg  daily  Medication Adjustments/Labs and Tests Ordered: Current medicines are reviewed at length with the patient today.  Concerns regarding medicines are outlined above.  Orders Placed This Encounter  Procedures   EKG 12-Lead   VAS CAROTID   No orders of the defined types were placed in this encounter.   Patient Instructions  Medication Instructions:  Your physician recommends that you continue on your current medications as directed. Please refer to the Current Medication list given to you today.  *If you need a refill on your cardiac medications before your next appointment, please call your pharmacy*   Lab Work: None ordered  If you have labs (blood work) drawn today and your tests are completely normal, you will receive your results only by: MyChart Message (if you have MyChart) OR A paper copy in the mail If you have any lab test that is abnormal or we need to change your treatment, we will call you to review the results.   Testing/Procedures: Your physician has requested that you have a carotid duplex IN October.  This test is an ultrasound of the carotid arteries in your neck. It looks at blood flow through these arteries that supply the brain with blood. Allow one hour for this exam. There are no restrictions or special instructions.    Follow-Up: At [facility], you and your  health needs are our priority.  As part of our continuing mission to provide you with exceptional heart care, we have created designated Provider Care Teams.  These Care Teams include your primary Cardiologist (physician) and Advanced Practice Providers (APPs -  Physician Assistants and Nurse Practitioners) who all work together to provide you with the care you need, when you need it.  We recommend signing up for the patient portal called "MyChart".  Sign up information is provided on this After Visit Summary.  MyChart is used to connect with patients for Virtual Visits (Telemedicine).  Patients are able to view lab/test results, encounter notes, upcoming appointments, etc.  Non-urgent messages can be sent to your provider as well.   To learn more about what you can do with MyChart, go to 10/2020.    Your next appointment:   12 month(s)  The format for your next appointment:   In Person  Provider:   You may see 10/2021, MD (Inactive) or one of the following Advanced Practice Providers on your designated Care Team:   , PA-C ,   Other Instructions    Signed, [place], MD  06/26/2021 8:22 AM    [facility]

## 2021-06-26 ENCOUNTER — Ambulatory Visit (INDEPENDENT_AMBULATORY_CARE_PROVIDER_SITE_OTHER): Payer: PPO | Admitting: Cardiology

## 2021-06-26 ENCOUNTER — Other Ambulatory Visit: Payer: Self-pay

## 2021-06-26 ENCOUNTER — Ambulatory Visit: Payer: PPO | Admitting: Cardiology

## 2021-06-26 ENCOUNTER — Encounter: Payer: Self-pay | Admitting: Cardiology

## 2021-06-26 VITALS — BP 122/70 | HR 60 | Ht 61.5 in | Wt 157.6 lb

## 2021-06-26 DIAGNOSIS — E785 Hyperlipidemia, unspecified: Secondary | ICD-10-CM | POA: Diagnosis not present

## 2021-06-26 DIAGNOSIS — I10 Essential (primary) hypertension: Secondary | ICD-10-CM

## 2021-06-26 DIAGNOSIS — I6523 Occlusion and stenosis of bilateral carotid arteries: Secondary | ICD-10-CM

## 2021-06-26 DIAGNOSIS — R600 Localized edema: Secondary | ICD-10-CM | POA: Diagnosis not present

## 2021-06-26 DIAGNOSIS — R002 Palpitations: Secondary | ICD-10-CM | POA: Diagnosis not present

## 2021-06-26 NOTE — Patient Instructions (Signed)
Medication Instructions:  Your physician recommends that you continue on your current medications as directed. Please refer to the Current Medication list given to you today.  *If you need a refill on your cardiac medications before your next appointment, please call your pharmacy*   Lab Work: None ordered  If you have labs (blood work) drawn today and your tests are completely normal, you will receive your results only by: MyChart Message (if you have MyChart) OR A paper copy in the mail If you have any lab test that is abnormal or we need to change your treatment, we will call you to review the results.   Testing/Procedures: Your physician has requested that you have a carotid duplex IN October.  This test is an ultrasound of the carotid arteries in your neck. It looks at blood flow through these arteries that supply the brain with blood. Allow one hour for this exam. There are no restrictions or special instructions.    Follow-Up: At [facility], you and your health needs are our priority.  As part of our continuing mission to provide you with exceptional heart care, we have created designated Provider Care Teams.  These Care Teams include your primary Cardiologist (physician) and Advanced Practice Providers (APPs -  Physician Assistants and Nurse Practitioners) who all work together to provide you with the care you need, when you need it.  We recommend signing up for the patient portal called "MyChart".  Sign up information is provided on this After Visit Summary.  MyChart is used to connect with patients for Virtual Visits (Telemedicine).  Patients are able to view lab/test results, encounter notes, upcoming appointments, etc.  Non-urgent messages can be sent to your provider as well.   To learn more about what you can do with MyChart, go to [email or web address].    Your next appointment:   12 month(s)  The format for your next appointment:   In Person  Provider:   You may  see Tobias Alexander, MD (Inactive) or one of the following Advanced Practice Providers on your designated Care Team:   Tereso Newcomer, PA-C Chelsea Aus, [place]   Other Instructions

## 2021-07-02 ENCOUNTER — Other Ambulatory Visit: Payer: Self-pay | Admitting: Physician Assistant

## 2021-07-02 DIAGNOSIS — I10 Essential (primary) hypertension: Secondary | ICD-10-CM

## 2021-07-02 DIAGNOSIS — I6523 Occlusion and stenosis of bilateral carotid arteries: Secondary | ICD-10-CM

## 2021-07-02 DIAGNOSIS — Z8249 Family history of ischemic heart disease and other diseases of the circulatory system: Secondary | ICD-10-CM

## 2021-07-02 DIAGNOSIS — E785 Hyperlipidemia, unspecified: Secondary | ICD-10-CM

## 2021-08-01 ENCOUNTER — Other Ambulatory Visit: Payer: Self-pay | Admitting: *Deleted

## 2021-08-01 MED ORDER — HYDROCHLOROTHIAZIDE 25 MG PO TABS: 25.0000 mg | ORAL_TABLET | Freq: Every day | ORAL | 2 refills | Status: DC

## 2021-08-25 DIAGNOSIS — Z23 Encounter for immunization: Secondary | ICD-10-CM | POA: Diagnosis not present

## 2021-09-13 ENCOUNTER — Other Ambulatory Visit: Payer: Self-pay | Admitting: Physician Assistant

## 2021-09-26 ENCOUNTER — Ambulatory Visit (HOSPITAL_COMMUNITY)
Admission: RE | Admit: 2021-09-26 | Discharge: 2021-09-26 | Disposition: A | Discharge status: Home / Self Care | Payer: PPO | Source: Ambulatory Visit | Attending: Internal Medicine | Admitting: Internal Medicine

## 2021-09-26 ENCOUNTER — Other Ambulatory Visit: Payer: Self-pay

## 2021-09-26 DIAGNOSIS — I6523 Occlusion and stenosis of bilateral carotid arteries: Secondary | ICD-10-CM

## 2021-09-27 ENCOUNTER — Other Ambulatory Visit: Payer: Self-pay | Admitting: Family Medicine

## 2021-09-27 DIAGNOSIS — Z1231 Encounter for screening mammogram for malignant neoplasm of breast: Secondary | ICD-10-CM

## 2021-09-28 ENCOUNTER — Ambulatory Visit
Admission: RE | Admit: 2021-09-28 | Discharge: 2021-09-28 | Disposition: A | Discharge status: Home / Self Care | Payer: PPO | Source: Ambulatory Visit | Attending: Family Medicine | Admitting: Family Medicine

## 2021-09-28 ENCOUNTER — Other Ambulatory Visit: Payer: Self-pay

## 2021-09-28 DIAGNOSIS — Z1231 Encounter for screening mammogram for malignant neoplasm of breast: Secondary | ICD-10-CM | POA: Diagnosis not present

## 2021-10-30 ENCOUNTER — Other Ambulatory Visit: Payer: Self-pay | Admitting: Family Medicine

## 2021-10-30 DIAGNOSIS — E2839 Other primary ovarian failure: Secondary | ICD-10-CM

## 2021-10-31 ENCOUNTER — Other Ambulatory Visit: Payer: Self-pay

## 2021-10-31 ENCOUNTER — Ambulatory Visit
Admission: RE | Admit: 2021-10-31 | Discharge: 2021-10-31 | Disposition: A | Discharge status: Home / Self Care | Payer: PPO | Source: Ambulatory Visit | Attending: Family Medicine | Admitting: Family Medicine

## 2021-10-31 DIAGNOSIS — M810 Age-related osteoporosis without current pathological fracture: Secondary | ICD-10-CM | POA: Diagnosis not present

## 2021-10-31 DIAGNOSIS — M85851 Other specified disorders of bone density and structure, right thigh: Secondary | ICD-10-CM | POA: Diagnosis not present

## 2021-10-31 DIAGNOSIS — Z780 Asymptomatic menopausal state: Secondary | ICD-10-CM | POA: Diagnosis not present

## 2021-10-31 DIAGNOSIS — E2839 Other primary ovarian failure: Secondary | ICD-10-CM

## 2021-11-08 DIAGNOSIS — M810 Age-related osteoporosis without current pathological fracture: Secondary | ICD-10-CM | POA: Diagnosis not present

## 2021-11-10 DIAGNOSIS — Z03818 Encounter for observation for suspected exposure to other biological agents ruled out: Secondary | ICD-10-CM | POA: Diagnosis not present

## 2022-02-14 DIAGNOSIS — H2513 Age-related nuclear cataract, bilateral: Secondary | ICD-10-CM | POA: Diagnosis not present

## 2022-02-14 DIAGNOSIS — H524 Presbyopia: Secondary | ICD-10-CM | POA: Diagnosis not present

## 2022-02-14 DIAGNOSIS — H31002 Unspecified chorioretinal scars, left eye: Secondary | ICD-10-CM | POA: Diagnosis not present

## 2022-04-30 ENCOUNTER — Other Ambulatory Visit: Payer: Self-pay | Admitting: *Deleted

## 2022-04-30 MED ORDER — HYDROCHLOROTHIAZIDE 25 MG PO TABS: 25.0000 mg | ORAL_TABLET | Freq: Every day | ORAL | 0 refills | Status: DC

## 2022-05-01 DIAGNOSIS — H25812 Combined forms of age-related cataract, left eye: Secondary | ICD-10-CM | POA: Diagnosis not present

## 2022-05-01 DIAGNOSIS — H2512 Age-related nuclear cataract, left eye: Secondary | ICD-10-CM | POA: Diagnosis not present

## 2022-05-15 DIAGNOSIS — H269 Unspecified cataract: Secondary | ICD-10-CM | POA: Diagnosis not present

## 2022-05-15 DIAGNOSIS — H2511 Age-related nuclear cataract, right eye: Secondary | ICD-10-CM | POA: Diagnosis not present

## 2022-06-07 DIAGNOSIS — Z0000 Encounter for general adult medical examination without abnormal findings: Secondary | ICD-10-CM | POA: Diagnosis not present

## 2022-06-07 DIAGNOSIS — G8929 Other chronic pain: Secondary | ICD-10-CM | POA: Diagnosis not present

## 2022-06-07 DIAGNOSIS — M549 Dorsalgia, unspecified: Secondary | ICD-10-CM | POA: Diagnosis not present

## 2022-06-07 DIAGNOSIS — I10 Essential (primary) hypertension: Secondary | ICD-10-CM | POA: Diagnosis not present

## 2022-06-07 DIAGNOSIS — Z23 Encounter for immunization: Secondary | ICD-10-CM | POA: Diagnosis not present

## 2022-06-07 DIAGNOSIS — E785 Hyperlipidemia, unspecified: Secondary | ICD-10-CM | POA: Diagnosis not present

## 2022-06-07 DIAGNOSIS — Z1159 Encounter for screening for other viral diseases: Secondary | ICD-10-CM | POA: Diagnosis not present

## 2022-06-07 DIAGNOSIS — M810 Age-related osteoporosis without current pathological fracture: Secondary | ICD-10-CM | POA: Diagnosis not present

## 2022-06-25 ENCOUNTER — Other Ambulatory Visit: Payer: Self-pay | Admitting: Family Medicine

## 2022-06-25 ENCOUNTER — Ambulatory Visit
Admission: RE | Admit: 2022-06-25 | Discharge: 2022-06-25 | Disposition: A | Discharge status: Home / Self Care | Payer: PPO | Source: Ambulatory Visit | Attending: Family Medicine | Admitting: Family Medicine

## 2022-06-25 DIAGNOSIS — G8929 Other chronic pain: Secondary | ICD-10-CM

## 2022-06-25 DIAGNOSIS — M47814 Spondylosis without myelopathy or radiculopathy, thoracic region: Secondary | ICD-10-CM | POA: Diagnosis not present

## 2022-06-25 DIAGNOSIS — M549 Dorsalgia, unspecified: Secondary | ICD-10-CM

## 2022-06-25 DIAGNOSIS — M4802 Spinal stenosis, cervical region: Secondary | ICD-10-CM | POA: Diagnosis not present

## 2022-06-25 DIAGNOSIS — M47812 Spondylosis without myelopathy or radiculopathy, cervical region: Secondary | ICD-10-CM | POA: Diagnosis not present

## 2022-06-27 ENCOUNTER — Other Ambulatory Visit: Payer: Self-pay

## 2022-06-27 DIAGNOSIS — E785 Hyperlipidemia, unspecified: Secondary | ICD-10-CM

## 2022-06-27 DIAGNOSIS — I6523 Occlusion and stenosis of bilateral carotid arteries: Secondary | ICD-10-CM

## 2022-06-27 DIAGNOSIS — I10 Essential (primary) hypertension: Secondary | ICD-10-CM

## 2022-06-27 DIAGNOSIS — Z8249 Family history of ischemic heart disease and other diseases of the circulatory system: Secondary | ICD-10-CM

## 2022-06-27 MED ORDER — LOSARTAN POTASSIUM 25 MG PO TABS: ORAL_TABLET | ORAL | 0 refills | Status: DC

## 2022-06-27 MED ORDER — METOPROLOL SUCCINATE ER 50 MG PO TB24: ORAL_TABLET | ORAL | 0 refills | Status: DC

## 2022-06-27 NOTE — Addendum Note (Signed)
Addended by: Margaret Pyle D on: 06/27/2022 07:54 AM   Modules accepted: Orders

## 2022-06-29 DIAGNOSIS — Z961 Presence of intraocular lens: Secondary | ICD-10-CM | POA: Diagnosis not present

## 2022-07-03 NOTE — Progress Notes (Unsigned)
Cardiology Office Note:    Date:  07/05/2022   ID:  Carol Roberts, DOB 08-Mar-1946, MRN [id number]  PCP:  Shirlean Mylar, MD   [facility] Providers Cardiologist:  Tobias Alexander, MD {  Referring MD: Shirlean Mylar, MD   History of Present Illness:    Carol Roberts is a 76 y.o. female with a hx of anemia, HTN, mild carotid artery disease, palpitations, and family history of premature CAD who was previously followed by Dr. Delton See who now presents to clinic for follow-up.  Per review of the record, coronary CTA 10/2017 with Ca score 1, no obstructive disease. 30 day monitor without arrhythmias.   Was last seen in clinic 06/2021 where she was doing well from a CV standpoint. BP well controlled.  Today, the patient overall feels well. No chest pain, SOB, orthopnea, or PND. Has occasional LE edema due to venous stasis that improves with HCTZ. Occasional palpitations but they are brief and do not sustain. No lightheadedness, dizziness or syncope. Blood pressure is elevated 130-150s.   Past Medical History:  Diagnosis Date   Anemia    "off and on"   Arthritis    knees, hands, hips - hx of dislocated hip many yrs ago    Fibromyalgia    GERD (gastroesophageal reflux disease)    H/O hiatal hernia    paraesophageal hernia, had surgery to repair   Headache(784.0)    ocas migraines   Paraesophageal hiatal hernia    causes vomiting, chest and left shoulder pain, sometimes left jaw pain    Past Surgical History:  Procedure Laterality Date   2011 RIGHT KNEE ARTHROSCOPY     APPENDECTOMY     BREAST EXCISIONAL BIOPSY Left 1987   BUNIONECTOMY Right 06/14/2016   Procedure: RIGHT LAPIDUS AND MODIFIED MCBRIDE BUNIONECTOMY;  Surgeon: Toni Arthurs, MD;  Location: [facility];  Service: Orthopedics;  Laterality: Right;  RIGHT LAPIDUS AND MODIFIED MCBRIDE BUNIONECTOMY   COLON SURGERY     bowel resection for diverticulitis   COLOSTOMY CLOSURE  2010   ESOPHAGEAL MANOMETRY N/A  06/08/2013   Procedure: ESOPHAGEAL MANOMETRY (EM);  Surgeon: Graylin Shiver, MD;  Location: [facility];  Service: Endoscopy;  Laterality: N/A;  Dr. Bosie Clos will read Manometry for Dr. Neita Garnet TOE SURGERY Right 06/14/2016   Procedure: RIGHT TWO TO THREE HAMMER TOE CORRECTION;  Surgeon: Toni Arthurs, MD;  Location: [facility];  Service: Orthopedics;  Laterality: Right;  RIGHT TWO TO THREE HAMMER TOE CORRECTION   HERNIA REPAIR     HIATAL HERNIA REPAIR  2005   [place]   HIATAL HERNIA REPAIR  2007   Lap with MTF bridge mesh.  Dr Daphine Deutscher,    LAPAROSCOPIC INCISIONAL / UMBILICAL / VENTRAL HERNIA REPAIR  11/2010   lap VWH with mesh   LEFT COLECTOMY  2010   with colostomy for perforated diverticulitis   TENDON TRANSFER Right 06/14/2016   Procedure: FLEXOR TO EXTENSOR TRANSFER;  Surgeon: Toni Arthurs, MD;  Location: [facility];  Service: Orthopedics;  Laterality: Right;  FLEXOR TO EXTENSOR TRANSFER   VENTRAL HERNIA REPAIR  09/06/2010   WEIL OSTEOTOMY Right 06/14/2016   Procedure: TWO TO THREE METATARSAL WEIL OSTEOTOMY;  Surgeon: Toni Arthurs, MD;  Location:  SURGERY CENTER;  Service: Orthopedics;  Laterality: Right;  TWO TO THREE METATARSAL WEIL OSTEOTOMY    Current Medications: Current Meds  Medication Sig   acetaminophen (TYLENOL) 500 MG tablet Take 1,000 mg by mouth  every 6 (six) hours as needed for pain.   aspirin EC 81 MG tablet Take 1 tablet (81 mg total) by mouth daily.   atorvastatin (LIPITOR) 20 MG tablet TAKE 1 TABLET(20 MG) BY MOUTH DAILY   busPIRone (BUSPAR) 30 MG tablet Take 30 mg by mouth 2 (two) times daily.   calcium-vitamin D (OSCAL WITH D) 500-200 MG-UNIT per tablet Take 1 tablet by mouth 2 (two) times daily.   dicyclomine (BENTYL) 20 MG tablet Take 20 mg by mouth 4 (four) times daily as needed for spasms.    hydrochlorothiazide (HYDRODIURIL) 25 MG tablet Take 1 tablet (25 mg total) by mouth daily.   Lactobacillus (ACIDOPHILUS PO) Take 1  capsule by mouth daily.    loratadine (CLARITIN) 10 MG tablet Take 10 mg by mouth daily as needed for allergies.    losartan (COZAAR) 50 MG tablet Take 1 tablet (50 mg total) by mouth daily.   metoprolol succinate (TOPROL-XL) 50 MG 24 hr tablet TAKE 1 AND 1/2 TABLET BY MOUTH EVERY DAY.   Multiple Vitamins-Minerals (MULTIVITAMIN WITH MINERALS) tablet Take 1 tablet by mouth daily.    Multiple Vitamins-Minerals (PRESERVISION AREDS 2) CAPS Take 2 tablets by mouth daily.   polyethylene glycol (MIRALAX / GLYCOLAX) packet Take 17 g by mouth every other day.    [DISCONTINUED] losartan (COZAAR) 25 MG tablet TAKE 1 TABLET BY MOUTH EVERY DAY.     Allergies:   Adhesive [tape]   Social History   Socioeconomic History   Marital status: Widowed    Spouse name: Not on file   Number of children: Not on file   Years of education: Not on file   Highest education level: Not on file  Occupational History   Not on file  Tobacco Use   Smoking status: Never   Smokeless tobacco: Never  Vaping Use   Vaping Use: Never used  Substance and Sexual Activity   Alcohol use: Yes    Comment: social   Drug use: No   Sexual activity: Never  Other Topics Concern   Not on file  Social History Narrative   Not on file   Social Determinants of Health   Financial Resource Strain: Not on file  Food Insecurity: Not on file  Transportation Needs: Not on file  Physical Activity: Not on file  Stress: Not on file  Social Connections: Not on file     Family History: The patient's family history includes Heart attack in her brother; Heart disease in her maternal grandmother and mother. There is no history of Breast cancer.  ROS:   Please see the history of present illness.    Review of Systems  Constitutional:  Negative for chills and fever.  HENT:  Negative for hearing loss.   Eyes:  Negative for blurred vision and double vision.  Respiratory:  Negative for shortness of breath.   Cardiovascular:  Positive for  palpitations and leg swelling. Negative for chest pain, orthopnea, claudication and PND.  Gastrointestinal:  Negative for nausea and vomiting.  Genitourinary:  Negative for dysuria.  Musculoskeletal:  Positive for myalgias.  Neurological:  Negative for dizziness and loss of consciousness.  Psychiatric/Behavioral:  Negative for substance abuse.      EKGs/Labs/Other Studies Reviewed:    The following studies were reviewed today: TTE 04/12/16: Study Conclusions   - Left ventricle: The cavity size was normal. There was mild    concentric hypertrophy. Systolic function was normal. The    estimated ejection fraction was in the range  of 60% to 65%. Wall    motion was normal; there were no regional wall motion    abnormalities. Doppler parameters are consistent with abnormal    left ventricular relaxation (grade 1 diastolic dysfunction).    Doppler parameters are consistent with high ventricular filling    pressure.  - Aortic valve: Transvalvular velocity was within the normal range.    There was no stenosis. There was mild regurgitation.  - Mitral valve: Transvalvular velocity was within the normal range.    There was no evidence for stenosis. There was no regurgitation.  - Left atrium: The atrium was moderately dilated.  - Right ventricle: The cavity size was normal. Wall thickness was    normal. Systolic function was normal.  - Right atrium: The atrium was mildly dilated.  - Atrial septum: No defect or patent foramen ovale was identified    by color flow Doppler.  - Tricuspid valve: There was mild regurgitation.  - Pulmonary arteries: Systolic pressure was within the normal    range. PA peak pressure: 33 mm Hg (S).   Coronary CTA 10/2017: FINDINGS: Non-cardiac: See separate report from [facility]. No significant findings on limited lung and soft tissue windows.   Calcium Score: Very tiny area of punctate calcium seen in proximal LAD and proximal RCA   Coronary Arteries:  Right dominant with no anomalies   LM: Normal   LAD:  Less than 30% calcified plaque in proximal LAD   D1:  Normal   D2: Normal   Circumflex:  Normal   OM1:  Small and normal   OM2:  Normal   OM3:  Normal   RCA:  Less than 20% calcified plaque in proximal vessel   PDA: Normal   PLA:  Normal   IMPRESSION: 1) Calcium score 1 which is 43 rd percentile for age and sex   2) No obstructive CAD see description above right dominant   3) Normal aortic root 3.1 cm   4) Mildly dilated main pulmonary artery 2.9 cm  EKG:  EKG is  ordered today.  The ekg ordered today demonstrates NSR with HR 66  Recent Labs: No results found for requested labs within last 365 days.  Recent Lipid Panel No results found for: "CHOL", "TRIG", "HDL", "CHOLHDL", "VLDL", "LDLCALC", "LDLDIRECT"        Physical Exam:    VS:  BP 140/70   Pulse 66   Ht 5' (1.524 m)   Wt 163 lb (73.9 kg)   SpO2 96%   BMI 31.83 kg/m     Wt Readings from Last 3 Encounters:  07/05/22 163 lb (73.9 kg)  06/26/21 157 lb 9.6 oz (71.5 kg)  05/23/21 156 lb (70.8 kg)     GEN:  Well nourished, well developed in no acute distress HEENT: Normal NECK: No JVD; faint bilateral carotid bruits CARDIAC: RRR, no murmurs, rubs, gallops RESPIRATORY:  Clear to auscultation without rales, wheezing or rhonchi  ABDOMEN: Soft, non-tender, non-distended MUSCULOSKELETAL:  Trace right ankle edema SKIN: Warm and dry NEUROLOGIC:  Alert and oriented x 3 PSYCHIATRIC:  Normal affect   ASSESSMENT:    1. Essential hypertension   2. Bilateral carotid artery stenosis   3. Leg edema   4. Palpitations   5. Hyperlipidemia, unspecified hyperlipidemia type    PLAN:    In order of problems listed above:  #HTN: Elevated at home 130-150s. -Increase losartan to 50mg  daily -Continue HCTZ 25mg  daily -Continue metop 50mg  XL daily -BMET next week  #LE  Edema: Controlled on HCTZ. Trace edema today. Having a hard time with compression  socks because exacerbates fibromyalgia. -Continue HCTZ 25mg  daily  #Palpitations: Monitor without arrhythmias. Has occasional flutters but overall well controlled with metop. -Continue metop 50mg  XL daily  #Mild Carotid Artery Disease: Carotid 09/2021 with mild disease 1-39% bilaterally -Repeat carotids 09/2022 for monitoring -Continue atorvastatin 20mg  daily -Continue ASA 81mg  daily  #HLD: LDL 52, TG 64 -Continue atorvastatin 20mg  daily  Medication Adjustments/Labs and Tests Ordered: Current medicines are reviewed at length with the patient today.  Concerns regarding medicines are outlined above.  Orders Placed This Encounter  Procedures   Basic metabolic panel   EKG 12-Lead   VAS 10/2021 CAROTID   Meds ordered this encounter  Medications   losartan (COZAAR) 50 MG tablet    Sig: Take 1 tablet (50 mg total) by mouth daily.    Dispense:  90 tablet    Refill:  3    Patient Instructions  Medication Instructions:  Your physician has recommended you make the following change in your medication: 1) INCREASE losartan to 50 mg daily *If you need a refill on your cardiac medications before your next appointment, please call your pharmacy*  Lab Work: IN ONE WEEK: BMET If you have labs (blood work) drawn today and your tests are completely normal, you will receive your results only by: MyChart Message (if you have MyChart) OR A paper copy in the mail If you have any lab test that is abnormal or we need to change your treatment, we will call you to review the results.  Testing/Procedures: Your physician has requested that you have a carotid duplex in October 2023. This test is an ultrasound of the carotid arteries in your neck. It looks at blood flow through these arteries that supply the brain with blood. Allow one hour for this exam. There are no restrictions or special instructions.  Follow-Up: At [facility], you and your health needs are our priority.  As part of our  continuing mission to provide you with exceptional heart care, we have created designated Provider Care Teams.  These Care Teams include your primary Cardiologist (physician) and Advanced Practice Providers (APPs -  Physician Assistants and Nurse Practitioners) who all work together to provide you with the care you need, when you need it.  Your next appointment:   1 year(s)  The format for your next appointment:   In Person  Provider:   , MD  Important Information About Sugar          Signed, , MD  07/05/2022 2:57 PM     [facility]

## 2022-07-04 DIAGNOSIS — M419 Scoliosis, unspecified: Secondary | ICD-10-CM | POA: Diagnosis not present

## 2022-07-04 DIAGNOSIS — M47812 Spondylosis without myelopathy or radiculopathy, cervical region: Secondary | ICD-10-CM | POA: Diagnosis not present

## 2022-07-04 DIAGNOSIS — M47816 Spondylosis without myelopathy or radiculopathy, lumbar region: Secondary | ICD-10-CM | POA: Diagnosis not present

## 2022-07-04 DIAGNOSIS — M810 Age-related osteoporosis without current pathological fracture: Secondary | ICD-10-CM | POA: Diagnosis not present

## 2022-07-05 ENCOUNTER — Ambulatory Visit: Payer: PPO | Admitting: Cardiology

## 2022-07-05 ENCOUNTER — Encounter: Payer: Self-pay | Admitting: Cardiology

## 2022-07-05 VITALS — BP 140/70 | HR 66 | Ht 60.0 in | Wt 163.0 lb

## 2022-07-05 DIAGNOSIS — I10 Essential (primary) hypertension: Secondary | ICD-10-CM

## 2022-07-05 DIAGNOSIS — R600 Localized edema: Secondary | ICD-10-CM | POA: Diagnosis not present

## 2022-07-05 DIAGNOSIS — E785 Hyperlipidemia, unspecified: Secondary | ICD-10-CM | POA: Diagnosis not present

## 2022-07-05 DIAGNOSIS — R002 Palpitations: Secondary | ICD-10-CM

## 2022-07-05 DIAGNOSIS — I6523 Occlusion and stenosis of bilateral carotid arteries: Secondary | ICD-10-CM | POA: Diagnosis not present

## 2022-07-05 MED ORDER — LOSARTAN POTASSIUM 50 MG PO TABS: 50.0000 mg | ORAL_TABLET | Freq: Every day | ORAL | 3 refills | Status: DC

## 2022-07-05 NOTE — Patient Instructions (Signed)
Medication Instructions:  Your physician has recommended you make the following change in your medication: 1) INCREASE losartan to 50 mg daily *If you need a refill on your cardiac medications before your next appointment, please call your pharmacy*  Lab Work: IN ONE WEEK: BMET If you have labs (blood work) drawn today and your tests are completely normal, you will receive your results only by: MyChart Message (if you have MyChart) OR A paper copy in the mail If you have any lab test that is abnormal or we need to change your treatment, we will call you to review the results.  Testing/Procedures: Your physician has requested that you have a carotid duplex in October 2023. This test is an ultrasound of the carotid arteries in your neck. It looks at blood flow through these arteries that supply the brain with blood. Allow one hour for this exam. There are no restrictions or special instructions.  Follow-Up: At [facility], you and your health needs are our priority.  As part of our continuing mission to provide you with exceptional heart care, we have created designated Provider Care Teams.  These Care Teams include your primary Cardiologist (physician) and Advanced Practice Providers (APPs -  Physician Assistants and Nurse Practitioners) who all work together to provide you with the care you need, when you need it.  Your next appointment:   1 year(s)  The format for your next appointment:   In Person  Provider:   Laurance Flatten, MD  Important Information About Sugar

## 2022-07-12 ENCOUNTER — Other Ambulatory Visit: Payer: PPO

## 2022-07-12 DIAGNOSIS — I10 Essential (primary) hypertension: Secondary | ICD-10-CM

## 2022-07-12 DIAGNOSIS — I6523 Occlusion and stenosis of bilateral carotid arteries: Secondary | ICD-10-CM

## 2022-07-13 LAB — BASIC METABOLIC PANEL
BUN/Creatinine Ratio: 25 (ref 12–28)
BUN: 21 mg/dL (ref 8–27)
CO2: 23 mmol/L (ref 20–29)
Calcium: 10.1 mg/dL (ref 8.7–10.3)
Chloride: 99 mmol/L (ref 96–106)
Creatinine, Ser: 0.85 mg/dL (ref 0.57–1.00)
Glucose: 103 mg/dL — ABNORMAL HIGH (ref 70–99)
Potassium: 4.4 mmol/L (ref 3.5–5.2)
Sodium: 138 mmol/L (ref 134–144)
eGFR: 72 mL/min/{1.73_m2} (ref >59)

## 2022-07-23 DIAGNOSIS — M2569 Stiffness of other specified joint, not elsewhere classified: Secondary | ICD-10-CM | POA: Diagnosis not present

## 2022-07-23 DIAGNOSIS — M542 Cervicalgia: Secondary | ICD-10-CM | POA: Diagnosis not present

## 2022-07-23 DIAGNOSIS — R269 Unspecified abnormalities of gait and mobility: Secondary | ICD-10-CM | POA: Diagnosis not present

## 2022-07-23 DIAGNOSIS — M545 Low back pain, unspecified: Secondary | ICD-10-CM | POA: Diagnosis not present

## 2022-07-27 ENCOUNTER — Other Ambulatory Visit: Payer: Self-pay

## 2022-07-27 MED ORDER — METOPROLOL SUCCINATE ER 50 MG PO TB24: ORAL_TABLET | ORAL | 3 refills | Status: DC

## 2022-07-27 MED ORDER — HYDROCHLOROTHIAZIDE 25 MG PO TABS: 25.0000 mg | ORAL_TABLET | Freq: Every day | ORAL | 3 refills | Status: DC

## 2022-07-27 MED ORDER — LOSARTAN POTASSIUM 50 MG PO TABS: 50.0000 mg | ORAL_TABLET | Freq: Every day | ORAL | 3 refills | Status: DC

## 2022-07-27 NOTE — Addendum Note (Signed)
Addended by: Margaret Pyle D on: 07/27/2022 08:38 AM   Modules accepted: Orders

## 2022-07-27 NOTE — Addendum Note (Signed)
Addended by: Margaret Pyle D on: 07/27/2022 08:37 AM   Modules accepted: Orders

## 2022-07-31 ENCOUNTER — Other Ambulatory Visit: Payer: Self-pay

## 2022-07-31 DIAGNOSIS — A545 Gonococcal pharyngitis: Secondary | ICD-10-CM | POA: Diagnosis not present

## 2022-07-31 DIAGNOSIS — M542 Cervicalgia: Secondary | ICD-10-CM | POA: Diagnosis not present

## 2022-07-31 DIAGNOSIS — R269 Unspecified abnormalities of gait and mobility: Secondary | ICD-10-CM | POA: Diagnosis not present

## 2022-07-31 DIAGNOSIS — M2569 Stiffness of other specified joint, not elsewhere classified: Secondary | ICD-10-CM | POA: Diagnosis not present

## 2022-07-31 MED ORDER — ATORVASTATIN CALCIUM 20 MG PO TABS: ORAL_TABLET | ORAL | 3 refills | Status: DC

## 2022-08-02 DIAGNOSIS — M542 Cervicalgia: Secondary | ICD-10-CM | POA: Diagnosis not present

## 2022-08-02 DIAGNOSIS — R269 Unspecified abnormalities of gait and mobility: Secondary | ICD-10-CM | POA: Diagnosis not present

## 2022-08-02 DIAGNOSIS — M2569 Stiffness of other specified joint, not elsewhere classified: Secondary | ICD-10-CM | POA: Diagnosis not present

## 2022-08-02 DIAGNOSIS — A545 Gonococcal pharyngitis: Secondary | ICD-10-CM | POA: Diagnosis not present

## 2022-08-06 DIAGNOSIS — A545 Gonococcal pharyngitis: Secondary | ICD-10-CM | POA: Diagnosis not present

## 2022-08-06 DIAGNOSIS — M2569 Stiffness of other specified joint, not elsewhere classified: Secondary | ICD-10-CM | POA: Diagnosis not present

## 2022-08-06 DIAGNOSIS — M542 Cervicalgia: Secondary | ICD-10-CM | POA: Diagnosis not present

## 2022-08-06 DIAGNOSIS — R269 Unspecified abnormalities of gait and mobility: Secondary | ICD-10-CM | POA: Diagnosis not present

## 2022-08-09 DIAGNOSIS — M2569 Stiffness of other specified joint, not elsewhere classified: Secondary | ICD-10-CM | POA: Diagnosis not present

## 2022-08-09 DIAGNOSIS — M542 Cervicalgia: Secondary | ICD-10-CM | POA: Diagnosis not present

## 2022-08-09 DIAGNOSIS — A545 Gonococcal pharyngitis: Secondary | ICD-10-CM | POA: Diagnosis not present

## 2022-08-09 DIAGNOSIS — R269 Unspecified abnormalities of gait and mobility: Secondary | ICD-10-CM | POA: Diagnosis not present

## 2022-08-15 DIAGNOSIS — A545 Gonococcal pharyngitis: Secondary | ICD-10-CM | POA: Diagnosis not present

## 2022-08-15 DIAGNOSIS — R269 Unspecified abnormalities of gait and mobility: Secondary | ICD-10-CM | POA: Diagnosis not present

## 2022-08-15 DIAGNOSIS — M2569 Stiffness of other specified joint, not elsewhere classified: Secondary | ICD-10-CM | POA: Diagnosis not present

## 2022-08-15 DIAGNOSIS — M542 Cervicalgia: Secondary | ICD-10-CM | POA: Diagnosis not present

## 2022-08-16 DIAGNOSIS — A545 Gonococcal pharyngitis: Secondary | ICD-10-CM | POA: Diagnosis not present

## 2022-08-16 DIAGNOSIS — R269 Unspecified abnormalities of gait and mobility: Secondary | ICD-10-CM | POA: Diagnosis not present

## 2022-08-16 DIAGNOSIS — M2569 Stiffness of other specified joint, not elsewhere classified: Secondary | ICD-10-CM | POA: Diagnosis not present

## 2022-08-16 DIAGNOSIS — M542 Cervicalgia: Secondary | ICD-10-CM | POA: Diagnosis not present

## 2022-08-20 DIAGNOSIS — M542 Cervicalgia: Secondary | ICD-10-CM | POA: Diagnosis not present

## 2022-08-20 DIAGNOSIS — M2569 Stiffness of other specified joint, not elsewhere classified: Secondary | ICD-10-CM | POA: Diagnosis not present

## 2022-08-20 DIAGNOSIS — R269 Unspecified abnormalities of gait and mobility: Secondary | ICD-10-CM | POA: Diagnosis not present

## 2022-08-20 DIAGNOSIS — A545 Gonococcal pharyngitis: Secondary | ICD-10-CM | POA: Diagnosis not present

## 2022-08-23 DIAGNOSIS — R269 Unspecified abnormalities of gait and mobility: Secondary | ICD-10-CM | POA: Diagnosis not present

## 2022-08-23 DIAGNOSIS — M542 Cervicalgia: Secondary | ICD-10-CM | POA: Diagnosis not present

## 2022-08-23 DIAGNOSIS — M2569 Stiffness of other specified joint, not elsewhere classified: Secondary | ICD-10-CM | POA: Diagnosis not present

## 2022-08-23 DIAGNOSIS — A545 Gonococcal pharyngitis: Secondary | ICD-10-CM | POA: Diagnosis not present

## 2022-08-27 DIAGNOSIS — M2569 Stiffness of other specified joint, not elsewhere classified: Secondary | ICD-10-CM | POA: Diagnosis not present

## 2022-08-27 DIAGNOSIS — A545 Gonococcal pharyngitis: Secondary | ICD-10-CM | POA: Diagnosis not present

## 2022-08-27 DIAGNOSIS — R269 Unspecified abnormalities of gait and mobility: Secondary | ICD-10-CM | POA: Diagnosis not present

## 2022-08-27 DIAGNOSIS — M542 Cervicalgia: Secondary | ICD-10-CM | POA: Diagnosis not present

## 2022-08-28 DIAGNOSIS — Z23 Encounter for immunization: Secondary | ICD-10-CM | POA: Diagnosis not present

## 2022-08-30 DIAGNOSIS — R269 Unspecified abnormalities of gait and mobility: Secondary | ICD-10-CM | POA: Diagnosis not present

## 2022-08-30 DIAGNOSIS — M542 Cervicalgia: Secondary | ICD-10-CM | POA: Diagnosis not present

## 2022-08-30 DIAGNOSIS — A545 Gonococcal pharyngitis: Secondary | ICD-10-CM | POA: Diagnosis not present

## 2022-08-30 DIAGNOSIS — M2569 Stiffness of other specified joint, not elsewhere classified: Secondary | ICD-10-CM | POA: Diagnosis not present

## 2022-09-04 DIAGNOSIS — M542 Cervicalgia: Secondary | ICD-10-CM | POA: Diagnosis not present

## 2022-09-04 DIAGNOSIS — R269 Unspecified abnormalities of gait and mobility: Secondary | ICD-10-CM | POA: Diagnosis not present

## 2022-09-04 DIAGNOSIS — A545 Gonococcal pharyngitis: Secondary | ICD-10-CM | POA: Diagnosis not present

## 2022-09-04 DIAGNOSIS — M2569 Stiffness of other specified joint, not elsewhere classified: Secondary | ICD-10-CM | POA: Diagnosis not present

## 2022-09-05 DIAGNOSIS — M810 Age-related osteoporosis without current pathological fracture: Secondary | ICD-10-CM | POA: Diagnosis not present

## 2022-09-05 DIAGNOSIS — E559 Vitamin D deficiency, unspecified: Secondary | ICD-10-CM | POA: Diagnosis not present

## 2022-09-07 ENCOUNTER — Other Ambulatory Visit: Payer: Self-pay | Admitting: Family Medicine

## 2022-09-07 DIAGNOSIS — Z1231 Encounter for screening mammogram for malignant neoplasm of breast: Secondary | ICD-10-CM

## 2022-09-11 DIAGNOSIS — A545 Gonococcal pharyngitis: Secondary | ICD-10-CM | POA: Diagnosis not present

## 2022-09-11 DIAGNOSIS — M2569 Stiffness of other specified joint, not elsewhere classified: Secondary | ICD-10-CM | POA: Diagnosis not present

## 2022-09-11 DIAGNOSIS — R269 Unspecified abnormalities of gait and mobility: Secondary | ICD-10-CM | POA: Diagnosis not present

## 2022-09-11 DIAGNOSIS — M542 Cervicalgia: Secondary | ICD-10-CM | POA: Diagnosis not present

## 2022-09-26 ENCOUNTER — Ambulatory Visit (HOSPITAL_COMMUNITY)
Admission: RE | Admit: 2022-09-26 | Discharge: 2022-09-26 | Disposition: A | Discharge status: Home / Self Care | Payer: PPO | Source: Ambulatory Visit | Attending: Internal Medicine | Admitting: Internal Medicine

## 2022-09-26 DIAGNOSIS — M542 Cervicalgia: Secondary | ICD-10-CM | POA: Diagnosis not present

## 2022-09-26 DIAGNOSIS — I10 Essential (primary) hypertension: Secondary | ICD-10-CM

## 2022-09-26 DIAGNOSIS — R269 Unspecified abnormalities of gait and mobility: Secondary | ICD-10-CM | POA: Diagnosis not present

## 2022-09-26 DIAGNOSIS — I6523 Occlusion and stenosis of bilateral carotid arteries: Secondary | ICD-10-CM | POA: Diagnosis not present

## 2022-09-26 DIAGNOSIS — M2569 Stiffness of other specified joint, not elsewhere classified: Secondary | ICD-10-CM | POA: Diagnosis not present

## 2022-09-26 DIAGNOSIS — A545 Gonococcal pharyngitis: Secondary | ICD-10-CM | POA: Diagnosis not present

## 2022-09-29 DIAGNOSIS — K120 Recurrent oral aphthae: Secondary | ICD-10-CM | POA: Diagnosis not present

## 2022-10-01 ENCOUNTER — Ambulatory Visit
Admission: RE | Admit: 2022-10-01 | Discharge: 2022-10-01 | Disposition: A | Discharge status: Home / Self Care | Payer: PPO | Source: Ambulatory Visit | Attending: Family Medicine | Admitting: Family Medicine

## 2022-10-01 DIAGNOSIS — M542 Cervicalgia: Secondary | ICD-10-CM | POA: Diagnosis not present

## 2022-10-01 DIAGNOSIS — R269 Unspecified abnormalities of gait and mobility: Secondary | ICD-10-CM | POA: Diagnosis not present

## 2022-10-01 DIAGNOSIS — M2569 Stiffness of other specified joint, not elsewhere classified: Secondary | ICD-10-CM | POA: Diagnosis not present

## 2022-10-01 DIAGNOSIS — Z1231 Encounter for screening mammogram for malignant neoplasm of breast: Secondary | ICD-10-CM

## 2022-10-01 DIAGNOSIS — A545 Gonococcal pharyngitis: Secondary | ICD-10-CM | POA: Diagnosis not present

## 2022-10-03 DIAGNOSIS — R269 Unspecified abnormalities of gait and mobility: Secondary | ICD-10-CM | POA: Diagnosis not present

## 2022-10-03 DIAGNOSIS — M2569 Stiffness of other specified joint, not elsewhere classified: Secondary | ICD-10-CM | POA: Diagnosis not present

## 2022-10-03 DIAGNOSIS — A545 Gonococcal pharyngitis: Secondary | ICD-10-CM | POA: Diagnosis not present

## 2022-10-03 DIAGNOSIS — M542 Cervicalgia: Secondary | ICD-10-CM | POA: Diagnosis not present

## 2022-10-08 DIAGNOSIS — M2569 Stiffness of other specified joint, not elsewhere classified: Secondary | ICD-10-CM | POA: Diagnosis not present

## 2022-10-08 DIAGNOSIS — R269 Unspecified abnormalities of gait and mobility: Secondary | ICD-10-CM | POA: Diagnosis not present

## 2022-10-08 DIAGNOSIS — A545 Gonococcal pharyngitis: Secondary | ICD-10-CM | POA: Diagnosis not present

## 2022-10-08 DIAGNOSIS — M542 Cervicalgia: Secondary | ICD-10-CM | POA: Diagnosis not present

## 2022-11-05 DIAGNOSIS — M47812 Spondylosis without myelopathy or radiculopathy, cervical region: Secondary | ICD-10-CM | POA: Diagnosis not present

## 2022-11-30 DIAGNOSIS — E785 Hyperlipidemia, unspecified: Secondary | ICD-10-CM | POA: Diagnosis not present

## 2022-11-30 DIAGNOSIS — E88819 Insulin resistance, unspecified: Secondary | ICD-10-CM | POA: Diagnosis not present

## 2022-11-30 DIAGNOSIS — I10 Essential (primary) hypertension: Secondary | ICD-10-CM | POA: Diagnosis not present

## 2022-11-30 DIAGNOSIS — R5382 Chronic fatigue, unspecified: Secondary | ICD-10-CM | POA: Diagnosis not present

## 2023-03-13 DIAGNOSIS — K219 Gastro-esophageal reflux disease without esophagitis: Secondary | ICD-10-CM | POA: Diagnosis not present

## 2023-03-13 DIAGNOSIS — M810 Age-related osteoporosis without current pathological fracture: Secondary | ICD-10-CM | POA: Diagnosis not present

## 2023-03-13 DIAGNOSIS — E785 Hyperlipidemia, unspecified: Secondary | ICD-10-CM | POA: Diagnosis not present

## 2023-03-13 DIAGNOSIS — E559 Vitamin D deficiency, unspecified: Secondary | ICD-10-CM | POA: Diagnosis not present

## 2023-06-18 DIAGNOSIS — M810 Age-related osteoporosis without current pathological fracture: Secondary | ICD-10-CM | POA: Diagnosis not present

## 2023-06-18 DIAGNOSIS — I10 Essential (primary) hypertension: Secondary | ICD-10-CM | POA: Diagnosis not present

## 2023-06-18 DIAGNOSIS — M62838 Other muscle spasm: Secondary | ICD-10-CM | POA: Diagnosis not present

## 2023-06-18 DIAGNOSIS — E559 Vitamin D deficiency, unspecified: Secondary | ICD-10-CM | POA: Diagnosis not present

## 2023-07-03 DIAGNOSIS — E6609 Other obesity due to excess calories: Secondary | ICD-10-CM | POA: Diagnosis not present

## 2023-07-03 DIAGNOSIS — Z131 Encounter for screening for diabetes mellitus: Secondary | ICD-10-CM | POA: Diagnosis not present

## 2023-07-03 DIAGNOSIS — K589 Irritable bowel syndrome without diarrhea: Secondary | ICD-10-CM | POA: Diagnosis not present

## 2023-07-03 DIAGNOSIS — E559 Vitamin D deficiency, unspecified: Secondary | ICD-10-CM | POA: Diagnosis not present

## 2023-07-03 DIAGNOSIS — Z0000 Encounter for general adult medical examination without abnormal findings: Secondary | ICD-10-CM | POA: Diagnosis not present

## 2023-07-03 DIAGNOSIS — Z6832 Body mass index (BMI) 32.0-32.9, adult: Secondary | ICD-10-CM | POA: Diagnosis not present

## 2023-07-03 DIAGNOSIS — F331 Major depressive disorder, recurrent, moderate: Secondary | ICD-10-CM | POA: Diagnosis not present

## 2023-07-03 DIAGNOSIS — I10 Essential (primary) hypertension: Secondary | ICD-10-CM | POA: Diagnosis not present

## 2023-07-03 DIAGNOSIS — E785 Hyperlipidemia, unspecified: Secondary | ICD-10-CM | POA: Diagnosis not present

## 2023-07-03 DIAGNOSIS — I5189 Other ill-defined heart diseases: Secondary | ICD-10-CM | POA: Diagnosis not present

## 2023-07-03 DIAGNOSIS — I6523 Occlusion and stenosis of bilateral carotid arteries: Secondary | ICD-10-CM | POA: Diagnosis not present

## 2023-07-29 ENCOUNTER — Other Ambulatory Visit: Payer: Self-pay

## 2023-07-29 MED ORDER — LOSARTAN POTASSIUM 50 MG PO TABS: 50.0000 mg | ORAL_TABLET | Freq: Every day | ORAL | 0 refills | Status: DC

## 2023-07-29 MED ORDER — ATORVASTATIN CALCIUM 20 MG PO TABS: ORAL_TABLET | ORAL | 0 refills | Status: DC

## 2023-08-20 ENCOUNTER — Other Ambulatory Visit: Payer: Self-pay | Admitting: Family Medicine

## 2023-08-20 DIAGNOSIS — Z1231 Encounter for screening mammogram for malignant neoplasm of breast: Secondary | ICD-10-CM

## 2023-08-26 ENCOUNTER — Other Ambulatory Visit: Payer: Self-pay | Admitting: *Deleted

## 2023-08-26 MED ORDER — METOPROLOL SUCCINATE ER 50 MG PO TB24: ORAL_TABLET | ORAL | 0 refills | Status: DC

## 2023-08-26 MED ORDER — HYDROCHLOROTHIAZIDE 25 MG PO TABS: 25.0000 mg | ORAL_TABLET | Freq: Every day | ORAL | 0 refills | Status: DC

## 2023-09-05 DIAGNOSIS — Z23 Encounter for immunization: Secondary | ICD-10-CM | POA: Diagnosis not present

## 2023-09-23 ENCOUNTER — Encounter: Payer: Self-pay | Admitting: Internal Medicine

## 2023-09-23 ENCOUNTER — Ambulatory Visit: Payer: Medicare HMO | Attending: Internal Medicine | Admitting: Internal Medicine

## 2023-09-23 VITALS — BP 122/60 | HR 65 | Ht 61.0 in | Wt 165.4 lb

## 2023-09-23 DIAGNOSIS — R002 Palpitations: Secondary | ICD-10-CM | POA: Diagnosis not present

## 2023-09-23 DIAGNOSIS — I6523 Occlusion and stenosis of bilateral carotid arteries: Secondary | ICD-10-CM

## 2023-09-23 MED ORDER — ATORVASTATIN CALCIUM 10 MG PO TABS: ORAL_TABLET | ORAL | 3 refills | Status: DC

## 2023-09-23 NOTE — Progress Notes (Signed)
Cardiology Office Note:    Date:  09/23/2023   ID:  [name], DOB May 28, 1946, MRN [id number]  PCP:  Shirlean Mylar, MD   [facility] Providers Cardiologist:  Tobias Alexander, MD {  Referring MD: Shirlean Mylar, MD   History of Present Illness:    Carol Roberts is a 77 y.o. female with a hx of HTN, palpitation (monitor without signficant arrhythmia), very mild CAD (CA score 1 and minimal plaquing of LAD by CCTA in 2018), tortuous carotid arteries, IBS She was previous followed by [facility]  Last seen in 2023    Since seen she denies CP  Breathing is OK overall though she does get a little SOB with stairs  Not new     She had palpitations over the summer   Last a few minutes   Improved with less now     Does admit to not drinking enough fluids     Patient has a walk pad at home  Has a sedentary job  (billing)    Breakfast   Egg or oatmeal or cheerios  Coffee black Lunch:   May skip   Or drink a boost  (has IBS) Dinner:   Public librarian   Lots of veggies   LImits red meat   Past Medical History:  Diagnosis Date   Anemia    "off and on"   Arthritis    knees, hands, hips - hx of dislocated hip many yrs ago    Fibromyalgia    GERD (gastroesophageal reflux disease)    H/O hiatal hernia    paraesophageal hernia, had surgery to repair   Headache(784.0)    ocas migraines   Paraesophageal hiatal hernia    causes vomiting, chest and left shoulder pain, sometimes left jaw pain    Past Surgical History:  Procedure Laterality Date   2011 RIGHT KNEE ARTHROSCOPY     APPENDECTOMY     BREAST EXCISIONAL BIOPSY Left 1987   BUNIONECTOMY Right 06/14/2016   Procedure: RIGHT LAPIDUS AND MODIFIED MCBRIDE BUNIONECTOMY;  Surgeon: Toni Arthurs, MD;  Location: [facility];  Service: Orthopedics;  Laterality: Right;  RIGHT LAPIDUS AND MODIFIED MCBRIDE BUNIONECTOMY   COLON SURGERY     bowel resection for diverticulitis   COLOSTOMY CLOSURE  2010   ESOPHAGEAL  MANOMETRY N/A 06/08/2013   Procedure: ESOPHAGEAL MANOMETRY (EM);  Surgeon: Graylin Shiver, MD;  Location: [facility];  Service: Endoscopy;  Laterality: N/A;  Dr. Bosie Clos will read Manometry for Dr. Neita Garnet TOE SURGERY Right 06/14/2016   Procedure: RIGHT TWO TO THREE HAMMER TOE CORRECTION;  Surgeon: Toni Arthurs, MD;  Location: [facility];  Service: Orthopedics;  Laterality: Right;  RIGHT TWO TO THREE HAMMER TOE CORRECTION   HERNIA REPAIR     HIATAL HERNIA REPAIR  2005   [place]   HIATAL HERNIA REPAIR  2007   Lap with MTF bridge mesh.  Dr Daphine Deutscher,    LAPAROSCOPIC INCISIONAL / UMBILICAL / VENTRAL HERNIA REPAIR  11/2010   lap VWH with mesh   LEFT COLECTOMY  2010   with colostomy for perforated diverticulitis   TENDON TRANSFER Right 06/14/2016   Procedure: FLEXOR TO EXTENSOR TRANSFER;  Surgeon: Toni Arthurs, MD;  Location: [facility];  Service: Orthopedics;  Laterality: Right;  FLEXOR TO EXTENSOR TRANSFER   VENTRAL HERNIA REPAIR  09/06/2010   WEIL OSTEOTOMY Right 06/14/2016   Procedure: TWO TO THREE METATARSAL WEIL OSTEOTOMY;  Surgeon: Toni Arthurs, MD;  Location: [facility];  Service: Orthopedics;  Laterality: Right;  TWO TO THREE METATARSAL WEIL OSTEOTOMY    Current Medications: Current Meds  Medication Sig   acetaminophen (TYLENOL) 500 MG tablet Take 1,000 mg by mouth every 6 (six) hours as needed for pain.   aspirin EC 81 MG tablet Take 1 tablet (81 mg total) by mouth daily.   atorvastatin (LIPITOR) 20 MG tablet TAKE 1 TABLET(20 MG) BY MOUTH DAILY   busPIRone (BUSPAR) 30 MG tablet Take 30 mg by mouth 2 (two) times daily.   Cholecalciferol (VITAMIN D3) 50 MCG (2000 UT) CAPS Take 2,000 Units by mouth daily in the afternoon.   denosumab (PROLIA) 60 MG/ML SOSY injection Inject 60 mg into the skin every 6 (six) months.   dicyclomine (BENTYL) 20 MG tablet Take 20 mg by mouth 4 (four) times daily as needed for spasms.    hydrochlorothiazide  (HYDRODIURIL) 25 MG tablet Take 1 tablet (25 mg total) by mouth daily.   Lactobacillus (ACIDOPHILUS PO) Take 1 capsule by mouth daily.    loratadine (CLARITIN) 10 MG tablet Take 10 mg by mouth daily as needed for allergies.    losartan (COZAAR) 50 MG tablet Take 1 tablet (50 mg total) by mouth daily.   metoprolol succinate (TOPROL-XL) 50 MG 24 hr tablet TAKE 1 AND 1/2 TABLET BY MOUTH EVERY DAY.   Multiple Vitamins-Minerals (PRESERVISION AREDS 2) CAPS Take 2 tablets by mouth daily.   polyethylene glycol (MIRALAX / GLYCOLAX) packet Take 17 g by mouth every other day.      Allergies:   Adhesive [tape]   Social History   Socioeconomic History   Marital status: Widowed    Spouse name: Not on file   Number of children: Not on file   Years of education: Not on file   Highest education level: Not on file  Occupational History   Not on file  Tobacco Use   Smoking status: Never   Smokeless tobacco: Never  Vaping Use   Vaping status: Never Used  Substance and Sexual Activity   Alcohol use: Yes    Comment: social   Drug use: No   Sexual activity: Never  Other Topics Concern   Not on file  Social History Narrative   Not on file   Social Determinants of Health   Financial Resource Strain: Not on file  Food Insecurity: Not on file  Transportation Needs: Not on file  Physical Activity: Not on file  Stress: Not on file  Social Connections: Not on file     Family History: The patient's family history includes Heart attack in her brother; Heart disease in her maternal grandmother and mother. There is no history of Breast cancer.  ROS:   Please see the history of present illness.       EKGs/Labs/Other Studies Reviewed:    The following studies were reviewed today: TTE Oct 26, 2016: Study Conclusions   - Left ventricle: The cavity size was normal. There was mild    concentric hypertrophy. Systolic function was normal. The    estimated ejection fraction was in the range of 60% to 65%.  Wall    motion was normal; there were no regional wall motion    abnormalities. Doppler parameters are consistent with abnormal    left ventricular relaxation (grade 1 diastolic dysfunction).    Doppler parameters are consistent with high ventricular filling    pressure.  - Aortic valve: Transvalvular velocity was within the normal  range.    There was no stenosis. There was mild regurgitation.  - Mitral valve: Transvalvular velocity was within the normal range.    There was no evidence for stenosis. There was no regurgitation.  - Left atrium: The atrium was moderately dilated.  - Right ventricle: The cavity size was normal. Wall thickness was    normal. Systolic function was normal.  - Right atrium: The atrium was mildly dilated.  - Atrial septum: No defect or patent foramen ovale was identified    by color flow Doppler.  - Tricuspid valve: There was mild regurgitation.  - Pulmonary arteries: Systolic pressure was within the normal    range. PA peak pressure: 33 mm Hg (S).   Coronary CTA 10/2017: FINDINGS: Non-cardiac: See separate report from [facility]. No significant findings on limited lung and soft tissue windows.   Calcium Score: Very tiny area of punctate calcium seen in proximal LAD and proximal RCA   Coronary Arteries: Right dominant with no anomalies   LM: Normal   LAD:  Less than 30% calcified plaque in proximal LAD   D1:  Normal   D2: Normal   Circumflex:  Normal   OM1:  Small and normal   OM2:  Normal   OM3:  Normal   RCA:  Less than 20% calcified plaque in proximal vessel   PDA: Normal   PLA:  Normal   IMPRESSION: 1) Calcium score 1 which is 43 rd percentile for age and sex   2) No obstructive CAD see description above right dominant   3) Normal aortic root 3.1 cm   4) Mildly dilated main pulmonary artery 2.9 cm  EKG:  EKG is  ordered today.  SR 63   bpm  Recent Labs: No results found for requested labs within last 365 days.   Recent Lipid Panel No results found for: "CHOL", "TRIG", "HDL", "CHOLHDL", "VLDL", "LDLCALC", "LDLDIRECT"        Physical Exam:    VS:  BP 122/60 (BP Location: Left Arm, Patient Position: Sitting, Cuff Size: Normal)   Pulse 65   Ht 5\' 1"  (1.549 m)   Wt 165 lb 6.4 oz (75 kg)   SpO2 95%   BMI 31.25 kg/m     Wt Readings from Last 3 Encounters:  09/23/23 165 lb 6.4 oz (75 kg)  07/05/22 163 lb (73.9 kg)  06/26/21 157 lb 9.6 oz (71.5 kg)     GEN:  Well nourished, well developed in no acute distress HEENT: Normal NECK: R carotid bruit   CARDIAC: RRR, no murmur RESPIRATORY:  Clear to auscultation  ABDOMEN: Soft, non-tender, non-distended  PLAN:    #HTN: BP is controlled on current regimen   Continue to follow    #  CAD   Minimal on CT scan in 2018   Keep on  current regimen  #  Palpitations  No hx of significant arrhythmias   Had exacerabation this summer   Episodes brief   Improved now  Stay hydrated      Keep on metoprolol If worsens will get an event monitor   #  CV dz Reviewed Vessels tortuous   Minimal plaquing of carotids           #HLD: Last LDL 52  Could cut back on lipitor to 10 mg    Follow up lipids next summer      #  LE edema Not signficant problem now  Folow up in 1 year   Medication Adjustments/Labs and  Tests Ordered: Current medicines are reviewed at length with the patient today.  Concerns regarding medicines are outlined above.  Orders Placed This Encounter  Procedures   EKG 12-Lead   No orders of the defined types were placed in this encounter.   There are no Patient Instructions on file for this visit.    Signed, Dietrich Pates, MD  09/23/2023 8:06 AM    [facility]

## 2023-09-23 NOTE — Patient Instructions (Signed)
Medication Instructions:  Decrease Lipitor to 10 mg a day  *If you need a refill on your cardiac medications before your next appointment, please call your pharmacy*   Lab Work:  If you have labs (blood work) drawn today and your tests are completely normal, you will receive your results only by: MyChart Message (if you have MyChart) OR A paper copy in the mail If you have any lab test that is abnormal or we need to change your treatment, we will call you to review the results.   Testing/Procedures:    Follow-Up: At [facility], you and your health needs are our priority.  As part of our continuing mission to provide you with exceptional heart care, we have created designated Provider Care Teams.  These Care Teams include your primary Cardiologist (physician) and Advanced Practice Providers (APPs -  Physician Assistants and Nurse Practitioners) who all work together to provide you with the care you need, when you need it.  We recommend signing up for the patient portal called "MyChart".  Sign up information is provided on this After Visit Summary.  MyChart is used to connect with patients for Virtual Visits (Telemedicine).  Patients are able to view lab/test results, encounter notes, upcoming appointments, etc.  Non-urgent messages can be sent to your provider as well.   To learn more about what you can do with MyChart, go to [email or web address].    Your next appointment:   1 year(s)  Provider:   Dietrich Pates MD     Other Instructions

## 2023-09-24 ENCOUNTER — Other Ambulatory Visit: Payer: Self-pay | Admitting: Internal Medicine

## 2023-10-03 ENCOUNTER — Ambulatory Visit: Payer: PPO

## 2023-10-09 ENCOUNTER — Ambulatory Visit
Admission: RE | Admit: 2023-10-09 | Discharge: 2023-10-09 | Disposition: A | Discharge status: Home / Self Care | Payer: Medicare HMO | Source: Ambulatory Visit | Attending: Family Medicine | Admitting: Family Medicine

## 2023-10-09 DIAGNOSIS — Z1231 Encounter for screening mammogram for malignant neoplasm of breast: Secondary | ICD-10-CM | POA: Diagnosis not present

## 2023-10-18 DIAGNOSIS — H26493 Other secondary cataract, bilateral: Secondary | ICD-10-CM | POA: Diagnosis not present

## 2023-10-18 DIAGNOSIS — H52203 Unspecified astigmatism, bilateral: Secondary | ICD-10-CM | POA: Diagnosis not present

## 2023-10-18 DIAGNOSIS — Z961 Presence of intraocular lens: Secondary | ICD-10-CM | POA: Diagnosis not present

## 2023-10-25 ENCOUNTER — Other Ambulatory Visit: Payer: Self-pay | Admitting: Internal Medicine

## 2023-11-15 ENCOUNTER — Other Ambulatory Visit: Payer: Self-pay | Admitting: Internal Medicine

## 2023-11-29 ENCOUNTER — Other Ambulatory Visit: Payer: Self-pay

## 2023-11-29 ENCOUNTER — Encounter: Payer: Self-pay | Admitting: Internal Medicine

## 2023-11-29 ENCOUNTER — Other Ambulatory Visit: Payer: Self-pay | Admitting: Internal Medicine

## 2023-11-29 MED ORDER — ATORVASTATIN CALCIUM 10 MG PO TABS: ORAL_TABLET | ORAL | 2 refills | Status: DC

## 2023-12-17 DIAGNOSIS — E559 Vitamin D deficiency, unspecified: Secondary | ICD-10-CM | POA: Diagnosis not present

## 2023-12-17 DIAGNOSIS — K6389 Other specified diseases of intestine: Secondary | ICD-10-CM | POA: Diagnosis not present

## 2023-12-17 DIAGNOSIS — I10 Essential (primary) hypertension: Secondary | ICD-10-CM | POA: Diagnosis not present

## 2023-12-19 DIAGNOSIS — H26493 Other secondary cataract, bilateral: Secondary | ICD-10-CM | POA: Diagnosis not present

## 2023-12-20 DIAGNOSIS — M810 Age-related osteoporosis without current pathological fracture: Secondary | ICD-10-CM | POA: Diagnosis not present

## 2024-01-03 DIAGNOSIS — E785 Hyperlipidemia, unspecified: Secondary | ICD-10-CM | POA: Diagnosis not present

## 2024-01-03 DIAGNOSIS — F419 Anxiety disorder, unspecified: Secondary | ICD-10-CM | POA: Diagnosis not present

## 2024-01-03 DIAGNOSIS — F331 Major depressive disorder, recurrent, moderate: Secondary | ICD-10-CM | POA: Diagnosis not present

## 2024-01-03 DIAGNOSIS — I10 Essential (primary) hypertension: Secondary | ICD-10-CM | POA: Diagnosis not present

## 2024-02-03 DIAGNOSIS — H538 Other visual disturbances: Secondary | ICD-10-CM | POA: Diagnosis not present

## 2024-02-03 DIAGNOSIS — F331 Major depressive disorder, recurrent, moderate: Secondary | ICD-10-CM | POA: Diagnosis not present

## 2024-02-03 DIAGNOSIS — Z23 Encounter for immunization: Secondary | ICD-10-CM | POA: Diagnosis not present

## 2024-04-09 DIAGNOSIS — H31002 Unspecified chorioretinal scars, left eye: Secondary | ICD-10-CM | POA: Diagnosis not present

## 2024-04-09 DIAGNOSIS — H5212 Myopia, left eye: Secondary | ICD-10-CM | POA: Diagnosis not present

## 2024-04-09 DIAGNOSIS — Z961 Presence of intraocular lens: Secondary | ICD-10-CM | POA: Diagnosis not present

## 2024-06-17 DIAGNOSIS — K047 Periapical abscess without sinus: Secondary | ICD-10-CM | POA: Diagnosis not present

## 2024-06-17 DIAGNOSIS — I10 Essential (primary) hypertension: Secondary | ICD-10-CM | POA: Diagnosis not present

## 2024-06-17 DIAGNOSIS — E559 Vitamin D deficiency, unspecified: Secondary | ICD-10-CM | POA: Diagnosis not present

## 2024-07-09 DIAGNOSIS — E785 Hyperlipidemia, unspecified: Secondary | ICD-10-CM | POA: Diagnosis not present

## 2024-07-16 DIAGNOSIS — L720 Epidermal cyst: Secondary | ICD-10-CM | POA: Diagnosis not present

## 2024-08-07 ENCOUNTER — Other Ambulatory Visit: Payer: Self-pay | Admitting: Internal Medicine

## 2024-09-09 ENCOUNTER — Other Ambulatory Visit: Payer: Self-pay | Admitting: Internal Medicine

## 2024-09-10 ENCOUNTER — Encounter (HOSPITAL_COMMUNITY): Payer: Self-pay | Admitting: General Surgery

## 2024-09-10 ENCOUNTER — Other Ambulatory Visit: Payer: Self-pay

## 2024-09-10 ENCOUNTER — Ambulatory Visit: Payer: Self-pay | Admitting: General Surgery

## 2024-09-10 NOTE — Progress Notes (Signed)
 Anesthesia Chart Review: SAME DAY WORK-UP  Case: [id number] Date/Time: 09/11/24 0715   Procedure: EXCISION MASS, BACK - MID-BACK UPPER BACK   Anesthesia type: Choice   Diagnosis: Epidermoid cyst of skin of back [L72.0]   Pre-op diagnosis: BACK MASS x2   [facility] / [facility]   Surgeons: Polly Cordella LABOR, MD       DISCUSSION: Patient is a 78 year old female scheduled for the above procedure.  History includes never smoker, HTN, GERD, paraesophageal hernia (s/p Nissen fundoplication at [facility] in [place] 03/2004; s/p laparoscopic takedown of herniated stomach and chest, repair of diaphragmatic defect with musculoskeletal human allograft patch 02/28/2006; s/p LOA, laparoscopic reduction and primary repair of paraesophageal hiatal hernia with mesh reinforcement, Nissen fundoplication, anterior/posterior gastropexy 07/31/2013), perforated diverticulitis (s/p Hartmann's procedure, therapeutic appendectomy 06/26/2019; colostomy takedown 08/11/2019), ventral hernia (s/p repair 09/06/2010, 11/27/2010), fibromyalgia, anemia, anxiety.  She is followed by cardiologist Dr. Vina Gull for HTN, palpitations (monitor without signficant arrhythmia), very mild CAD (CAC score 1 and minimal plaquing of LAD by CCTA in 2018), tortuous carotid arteries with minimal plaque. Last visit 09/23/2023. HTN controlled. Palpitations improved on metoprolol . LDL 52, so could cut back on Liptor. No significant edema. One year follow-up planned.   Anesthesia team to evaluate on the day of surgery.   VS: Ht 5' 1 (1.549 m)   BMI 31.25 kg/m  BP Readings from Last 3 Encounters:  09/23/23 122/60  07/05/22 140/70  06/26/21 122/70   Pulse Readings from Last 3 Encounters:  09/23/23 65  07/05/22 66  06/26/21 60     PROVIDERS: Douglass Ivanoff, MD is PCP Gull Vina, MD is cardiologist   LABS: For day of surgery as indicated.    EKG: 09/22/2024: NSR   CV: US  Carotid 09/26/2022: Summary:  - Right Carotid:  Velocities in the right ICA are consistent with a 1-39% stenosis.  - Left Carotid: Velocities in the left ICA are consistent with a 1-39% stenosis.  - Vertebrals: Bilateral vertebral arteries demonstrate antegrade flow.  - Subclavians: Normal flow hemodynamics were seen in bilateral subclavian arteries.    Coronary CTA 11/01/2017: FINDINGS: Calcium  Score: Very tiny area of punctate calcium  seen in proximal LAD and proximal RCA.   Coronary Arteries: Right dominant with no anomalies.   LM: Normal LAD:  Less than 30% calcified plaque in proximal LAD D1:  Normal D2: Normal Circumflex:  Normal OM1:  Small and normal OM2:  Normal OM3:  Normal RCA:  Less than 20% calcified plaque in proximal vessel PDA: Normal PLA:  Normal   IMPRESSION: 1) Calcium  score 1 which is 43 rd percentile for age and sex 2) No obstructive CAD see description above right dominant 3) Normal aortic root 3.1 cm 4) Mildly dilated main pulmonary artery 2.9 cm   30 day event monitor 09/26/2016 - 10/25/2016: Sinus bradycardia to sinus tachycardia. No pauses or arrhythmias.   Normal 30 day event monitor.    TTE 09/17/2016: Study Conclusions  - Left ventricle: The cavity size was normal. There was mild    concentric hypertrophy. Systolic function was normal. The    estimated ejection fraction was in the range of 60% to 65%. Wall    motion was normal; there were no regional wall motion    abnormalities. Doppler parameters are consistent with abnormal    left ventricular relaxation (grade 1 diastolic dysfunction).    Doppler parameters are consistent with high ventricular filling    pressure.  - Aortic valve: Transvalvular  velocity was within the normal range.    There was no stenosis. There was mild regurgitation.  - Mitral valve: Transvalvular velocity was within the normal range.    There was no evidence for stenosis. There was no regurgitation.  - Left atrium: The atrium was moderately dilated.  - Right  ventricle: The cavity size was normal. Wall thickness was    normal. Systolic function was normal.  - Right atrium: The atrium was mildly dilated.  - Atrial septum: No defect or patent foramen ovale was identified    by color flow Doppler.  - Tricuspid valve: There was mild regurgitation.  - Pulmonary arteries: Systolic pressure was within the normal    range. PA peak pressure: 33 mm Hg (S).     Past Medical History:  Diagnosis Date   Anemia    off and on   Anxiety    Arthritis    knees, hands, hips - hx of dislocated hip many yrs ago    Fibromyalgia    GERD (gastroesophageal reflux disease)    H/O hiatal hernia    paraesophageal hernia, had surgery to repair   Headache(784.0)    ocas migraines   Hypertension    Paraesophageal hiatal hernia    causes vomiting, chest and left shoulder pain, sometimes left jaw pain    Past Surgical History:  Procedure Laterality Date   2011 RIGHT KNEE ARTHROSCOPY     APPENDECTOMY     BREAST EXCISIONAL BIOPSY Left 1987   BUNIONECTOMY Right 06/14/2016   Procedure: RIGHT LAPIDUS AND MODIFIED MCBRIDE BUNIONECTOMY;  Surgeon: Norleen Armor, MD;  Location: [facility];  Service: Orthopedics;  Laterality: Right;  RIGHT LAPIDUS AND MODIFIED MCBRIDE BUNIONECTOMY   COLON SURGERY     bowel resection for diverticulitis   COLOSTOMY CLOSURE  2010   ESOPHAGEAL MANOMETRY N/A 06/08/2013   Procedure: ESOPHAGEAL MANOMETRY (EM);  Surgeon: Lesta JULIANNA Fitz, MD;  [facility];  Service: Endoscopy;  Laterality: N/A;  Dr. Dianna will read Manometry for Dr. Fitz FREES TOE SURGERY Right 06/14/2016   Procedure: RIGHT TWO TO THREE HAMMER TOE CORRECTION;  Surgeon: Norleen Armor, MD;  Location: [facility];  Service: Orthopedics;  Laterality: Right;  RIGHT TWO TO THREE HAMMER TOE CORRECTION   HERNIA REPAIR     HIATAL HERNIA REPAIR  2005   [place]   HIATAL HERNIA REPAIR  2007   Lap with MTF bridge mesh.  Dr Gladis,    LAPAROSCOPIC  INCISIONAL / UMBILICAL / VENTRAL HERNIA REPAIR  11/2010   lap VWH with mesh   LEFT COLECTOMY  2010   with colostomy for perforated diverticulitis   TENDON TRANSFER Right 06/14/2016   Procedure: FLEXOR TO EXTENSOR TRANSFER;  Surgeon: Norleen Armor, MD;  Location: [facility];  Service: Orthopedics;  Laterality: Right;  FLEXOR TO EXTENSOR TRANSFER   VENTRAL HERNIA REPAIR  09/06/2010   WEIL OSTEOTOMY Right 06/14/2016   Procedure: TWO TO THREE METATARSAL WEIL OSTEOTOMY;  Surgeon: Norleen Armor, MD;  Location: [facility];  Service: Orthopedics;  Laterality: Right;  TWO TO THREE METATARSAL WEIL OSTEOTOMY    MEDICATIONS: No current facility-administered medications for this encounter.    acetaminophen  (TYLENOL ) 500 MG tablet   aspirin  EC 81 MG tablet   atorvastatin  (LIPITOR) 10 MG tablet   buPROPion (WELLBUTRIN) 100 MG tablet   busPIRone (BUSPAR) 30 MG tablet   Cholecalciferol (VITAMIN D3) 50 MCG (2000 UT) CAPS   denosumab  (PROLIA ) 60  MG/ML SOSY injection   dicyclomine (BENTYL) 20 MG tablet   Lactobacillus (ACIDOPHILUS PO)   loratadine (CLARITIN) 10 MG tablet   losartan  (COZAAR ) 50 MG tablet   Multiple Vitamins-Minerals (PRESERVISION AREDS 2) CAPS   hydrochlorothiazide  (HYDRODIURIL ) 25 MG tablet   metoprolol  succinate (TOPROL -XL) 50 MG 24 hr tablet   Isaiah Ruder, PA-C Surgical Short Stay/Anesthesiology [facility] Phone [phone] [facility] Phone [phone] 09/10/2024 2:30 PM

## 2024-09-10 NOTE — Progress Notes (Signed)
 PCP - Anthony Hint  Cardiologist -Okey Vina GAILS, MD (LOV 09-23-23 follows up yearly)    PPM/ICD - denies Device Orders - n/a Rep Notified - n/a  Chest x-ray - denies EKG - 09-23-23 Stress Test - denies ECHO - 09-17-16 Cardiac Cath - denies  CPAP - denies  DM -denies  Blood Thinner Instructions: denies Aspirin  Instructions: Instructed patient to reach out to surgeon's office for further instructions  ERAS Protcol - Clear liquids until 4:30  COVID TEST- n/a  Anesthesia review: yes, Hx of HTN, Palpitations, CAD  Patient verbally denies any shortness of breath, fever, cough and chest pain during phone call   -------------  SDW INSTRUCTIONS given:  Your procedure is scheduled on September 11, 2024.  Report to [facility] Main Entrance A at 5:30 A.M., and check in at the Admitting office.  Call this number if you have problems the morning of surgery:  [phone]   Remember:  Do not eat after midnight the night before your surgery  You may drink clear liquids until 4:30 the morning of your surgery.   Clear liquids allowed are: Water, Non-Citrus Juices (without pulp), Carbonated Beverages, Clear Tea, Black Coffee Only, and Gatorade    Take these medicines the morning of surgery with A SIP OF WATER  acetaminophen  (TYLENOL )  atorvastatin  (LIPITOR)  buPROPion (WELLBUTRIN)  busPIRone (BUSPAR)  dicyclomine (BENTYL)  loratadine (CLARITIN)  metoprolol  succinate (TOPROL -XL)   As of today, STOP taking any Aspirin  (unless otherwise instructed by your surgeon) Aleve, Naproxen, Ibuprofen, Motrin, Advil, Goody's, BC's, all herbal medications, fish oil, and all vitamins.                      Do not wear jewelry, make up, or nail polish            Do not wear lotions, powders, perfumes/colognes, or deodorant.            Do not shave 48 hours prior to surgery.  Men may shave face and neck.            Do not bring valuables to the hospital.            [facility] is not responsible  for any belongings or valuables.  Do NOT Smoke (Tobacco/Vaping) 24 hours prior to your procedure If you use a CPAP at night, you may bring all equipment for your overnight stay.   Contacts, glasses, dentures or bridgework may not be worn into surgery.      For patients admitted to the hospital, discharge time will be determined by your treatment team.   Patients discharged the day of surgery will not be allowed to drive home, and someone needs to stay with them for 24 hours.    Special instructions:   [facility]- Preparing For Surgery  Before surgery, you can play an important role. Because skin is not sterile, your skin needs to be as free of germs as possible. You can reduce the number of germs on your skin by washing with CHG (chlorahexidine gluconate) Soap before surgery.  CHG is an antiseptic cleaner which kills germs and bonds with the skin to continue killing germs even after washing.    Oral Hygiene is also important to reduce your risk of infection.  Remember - BRUSH YOUR TEETH THE MORNING OF SURGERY WITH YOUR REGULAR TOOTHPASTE  Please do not use if you have an allergy to CHG or antibacterial soaps. If your skin becomes reddened/irritated stop using the  CHG.  Do not shave (including legs and underarms) for at least 48 hours prior to first CHG shower. It is OK to shave your face.  Please follow these instructions carefully.   Shower the NIGHT BEFORE SURGERY and the MORNING OF SURGERY with DIAL Soap.   Pat yourself dry with a CLEAN TOWEL.  Wear CLEAN PAJAMAS to bed the night before surgery  Place CLEAN SHEETS on your bed the night of your first shower and DO NOT SLEEP WITH PETS.   Day of Surgery: Please shower morning of surgery  Wear Clean/Comfortable clothing the morning of surgery Do not apply any deodorants/lotions.   Remember to brush your teeth WITH YOUR REGULAR TOOTHPASTE.   Questions were answered. Patient verbalized understanding of instructions.

## 2024-09-10 NOTE — Anesthesia Preprocedure Evaluation (Addendum)
 Anesthesia Evaluation  Patient identified by MRN, date of birth, ID band Patient awake    Reviewed: Allergy & Precautions, NPO status , Patient's Chart, lab work & pertinent test results, reviewed documented beta blocker date and time   History of Anesthesia Complications Negative for: history of anesthetic complications  Airway Mallampati: II  TM Distance: >3 FB Neck ROM: Full    Dental  (+) Partial Upper   Pulmonary neg pulmonary ROS   Pulmonary exam normal        Cardiovascular hypertension, Pt. on home beta blockers and Pt. on medications Normal cardiovascular exam     Neuro/Psych  Headaches  Anxiety        GI/Hepatic Neg liver ROS, hiatal hernia,GERD  Controlled,,  Endo/Other  negative endocrine ROS    Renal/GU negative Renal ROS     Musculoskeletal  (+) Arthritis ,  Fibromyalgia -  Abdominal   Peds  Hematology negative hematology ROS (+)   Anesthesia Other Findings BACK MASS x2  Reproductive/Obstetrics                              Anesthesia Physical Anesthesia Plan  ASA: 2  Anesthesia Plan: MAC   Post-op Pain Management: Tylenol  PO (pre-op)*   Induction:   PONV Risk Score and Plan: 2 and Treatment may vary due to age or medical condition, Ondansetron  and Propofol  infusion  Airway Management Planned: Natural Airway and Simple Face Mask  Additional Equipment: None  Intra-op Plan:   Post-operative Plan: Extubation in OR  Informed Consent: I have reviewed the patients History and Physical, chart, labs and discussed the procedure including the risks, benefits and alternatives for the proposed anesthesia with the patient or authorized representative who has indicated his/her understanding and acceptance.       Plan Discussed with: CRNA  Anesthesia Plan Comments: (PAT note written 09/10/2024 by Allison Zelenak, PA-C.  )         Anesthesia Quick Evaluation

## 2024-09-11 ENCOUNTER — Other Ambulatory Visit: Payer: Self-pay

## 2024-09-11 ENCOUNTER — Encounter (HOSPITAL_COMMUNITY): Payer: Self-pay | Admitting: General Surgery

## 2024-09-11 ENCOUNTER — Encounter (HOSPITAL_COMMUNITY): Payer: Self-pay | Admitting: Vascular Surgery

## 2024-09-11 ENCOUNTER — Ambulatory Visit (HOSPITAL_COMMUNITY)
Admission: RE | Admit: 2024-09-11 | Discharge: 2024-09-11 | Disposition: A | Discharge status: Home / Self Care | Attending: General Surgery | Admitting: General Surgery

## 2024-09-11 ENCOUNTER — Encounter (HOSPITAL_COMMUNITY)
Admission: RE | Disposition: A | Discharge status: Home / Self Care | Payer: Self-pay | Source: Home / Self Care | Attending: General Surgery

## 2024-09-11 ENCOUNTER — Ambulatory Visit (HOSPITAL_BASED_OUTPATIENT_CLINIC_OR_DEPARTMENT_OTHER): Payer: Self-pay | Admitting: Vascular Surgery

## 2024-09-11 DIAGNOSIS — R222 Localized swelling, mass and lump, trunk: Secondary | ICD-10-CM

## 2024-09-11 DIAGNOSIS — L720 Epidermal cyst: Secondary | ICD-10-CM | POA: Insufficient documentation

## 2024-09-11 DIAGNOSIS — F419 Anxiety disorder, unspecified: Secondary | ICD-10-CM | POA: Insufficient documentation

## 2024-09-11 DIAGNOSIS — K219 Gastro-esophageal reflux disease without esophagitis: Secondary | ICD-10-CM | POA: Diagnosis not present

## 2024-09-11 DIAGNOSIS — I10 Essential (primary) hypertension: Secondary | ICD-10-CM

## 2024-09-11 DIAGNOSIS — M797 Fibromyalgia: Secondary | ICD-10-CM | POA: Insufficient documentation

## 2024-09-11 DIAGNOSIS — I2510 Atherosclerotic heart disease of native coronary artery without angina pectoris: Secondary | ICD-10-CM | POA: Insufficient documentation

## 2024-09-11 DIAGNOSIS — K449 Diaphragmatic hernia without obstruction or gangrene: Secondary | ICD-10-CM | POA: Diagnosis not present

## 2024-09-11 DIAGNOSIS — Z9889 Other specified postprocedural states: Secondary | ICD-10-CM | POA: Diagnosis not present

## 2024-09-11 DIAGNOSIS — Z79899 Other long term (current) drug therapy: Secondary | ICD-10-CM | POA: Insufficient documentation

## 2024-09-11 DIAGNOSIS — Z9049 Acquired absence of other specified parts of digestive tract: Secondary | ICD-10-CM | POA: Insufficient documentation

## 2024-09-11 HISTORY — PX: EXCISION MASS, BACK: SHX7560

## 2024-09-11 HISTORY — DX: Essential (primary) hypertension: I10

## 2024-09-11 HISTORY — DX: Anxiety disorder, unspecified: F41.9

## 2024-09-11 LAB — BASIC METABOLIC PANEL WITH GFR
Anion gap: 10 (ref 5–15)
BUN: 18 mg/dL (ref 8–23)
CO2: 26 mmol/L (ref 22–32)
Calcium: 9.7 mg/dL (ref 8.9–10.3)
Chloride: 102 mmol/L (ref 98–111)
Creatinine, Ser: 0.82 mg/dL (ref 0.44–1.00)
GFR, Estimated: >60 mL/min (ref >60)
Glucose, Bld: 70 mg/dL (ref 70–99)
Potassium: 3.4 mmol/L — ABNORMAL LOW (ref 3.5–5.1)
Sodium: 138 mmol/L (ref 135–145)

## 2024-09-11 LAB — CBC
HCT: 35.3 % — ABNORMAL LOW (ref 36.0–46.0)
Hemoglobin: 11.9 g/dL — ABNORMAL LOW (ref 12.0–15.0)
MCH: 32.9 pg (ref 26.0–34.0)
MCHC: 33.7 g/dL (ref 30.0–36.0)
MCV: 97.5 fL (ref 80.0–100.0)
Platelets: 197 K/uL (ref 150–400)
RBC: 3.62 MIL/uL — ABNORMAL LOW (ref 3.87–5.11)
RDW: 13.5 % (ref 11.5–15.5)
WBC: 4.4 K/uL (ref 4.0–10.5)
nRBC: 0 % (ref 0.0–0.2)

## 2024-09-11 SURGERY — EXCISION MASS, BACK
Anesthesia: Monitor Anesthesia Care | Site: Back

## 2024-09-11 MED ORDER — ACETAMINOPHEN 325 MG PO TABS: 650.0000 mg | ORAL_TABLET | ORAL | Status: DC | PRN

## 2024-09-11 MED ORDER — OXYCODONE HCL 5 MG/5ML PO SOLN: 5.0000 mg | Freq: Once | ORAL | Status: DC | PRN

## 2024-09-11 MED ORDER — MORPHINE SULFATE (PF) 2 MG/ML IV SOLN: 1.0000 mg | INTRAVENOUS | Status: DC | PRN

## 2024-09-11 MED ORDER — DROPERIDOL 2.5 MG/ML IJ SOLN: 0.6250 mg | Freq: Once | INTRAMUSCULAR | Status: DC | PRN

## 2024-09-11 MED ORDER — PROPOFOL 10 MG/ML IV BOLUS
INTRAVENOUS | Status: AC
  Filled 2024-09-11: qty 20

## 2024-09-11 MED ORDER — CHLORHEXIDINE GLUCONATE CLOTH 2 % EX PADS: 6.0000 | MEDICATED_PAD | Freq: Once | CUTANEOUS | Status: DC

## 2024-09-11 MED ORDER — CHLORHEXIDINE GLUCONATE 0.12 % MT SOLN
15.0000 mL | Freq: Once | OROMUCOSAL | Status: AC
  Administered 2024-09-11: 15 mL via OROMUCOSAL
  Filled 2024-09-11: qty 15

## 2024-09-11 MED ORDER — SODIUM CHLORIDE 0.9% FLUSH: 3.0000 mL | Freq: Two times a day (BID) | INTRAVENOUS | Status: DC

## 2024-09-11 MED ORDER — CHLORHEXIDINE GLUCONATE CLOTH 2 % EX PADS
6.0000 | MEDICATED_PAD | Freq: Once | CUTANEOUS | Status: DC
Start: 2024-09-11 — End: 2024-09-11

## 2024-09-11 MED ORDER — ACETAMINOPHEN 325 MG PO TABS: 650.0000 mg | ORAL_TABLET | Freq: Four times a day (QID) | ORAL | 0 refills | Status: AC

## 2024-09-11 MED ORDER — ACETAMINOPHEN 650 MG RE SUPP: 650.0000 mg | RECTAL | Status: DC | PRN

## 2024-09-11 MED ORDER — LACTATED RINGERS IV SOLN: INTRAVENOUS | Status: DC

## 2024-09-11 MED ORDER — ACETAMINOPHEN 500 MG PO TABS
1000.0000 mg | ORAL_TABLET | ORAL | Status: AC
Start: 2024-09-11 — End: 2024-09-11
  Administered 2024-09-11: 1000 mg via ORAL
  Filled 2024-09-11: qty 2

## 2024-09-11 MED ORDER — FENTANYL CITRATE (PF) 100 MCG/2ML IJ SOLN: 25.0000 ug | INTRAMUSCULAR | Status: DC | PRN

## 2024-09-11 MED ORDER — MIDAZOLAM HCL 2 MG/2ML IJ SOLN
INTRAMUSCULAR | Status: AC
  Filled 2024-09-11: qty 2

## 2024-09-11 MED ORDER — OXYCODONE HCL 5 MG PO TABS: 5.0000 mg | ORAL_TABLET | Freq: Three times a day (TID) | ORAL | 0 refills | Status: AC | PRN

## 2024-09-11 MED ORDER — OXYCODONE HCL 5 MG PO TABS: 5.0000 mg | ORAL_TABLET | ORAL | Status: DC | PRN

## 2024-09-11 MED ORDER — MIDAZOLAM HCL 2 MG/2ML IJ SOLN
INTRAMUSCULAR | Status: DC | PRN
  Administered 2024-09-11: 2 mg via INTRAVENOUS

## 2024-09-11 MED ORDER — FENTANYL CITRATE (PF) 250 MCG/5ML IJ SOLN
INTRAMUSCULAR | Status: AC
  Filled 2024-09-11: qty 5

## 2024-09-11 MED ORDER — GABAPENTIN 300 MG PO CAPS
300.0000 mg | ORAL_CAPSULE | ORAL | Status: AC
  Administered 2024-09-11: 300 mg via ORAL
  Filled 2024-09-11: qty 1

## 2024-09-11 MED ORDER — BUPIVACAINE-EPINEPHRINE (PF) 0.25% -1:200000 IJ SOLN
INTRAMUSCULAR | Status: AC
  Filled 2024-09-11: qty 30

## 2024-09-11 MED ORDER — FENTANYL CITRATE (PF) 250 MCG/5ML IJ SOLN
INTRAMUSCULAR | Status: DC | PRN
  Administered 2024-09-11: 25 ug via INTRAVENOUS

## 2024-09-11 MED ORDER — LIDOCAINE-EPINEPHRINE 1 %-1:100000 IJ SOLN
INTRAMUSCULAR | Status: AC
  Filled 2024-09-11: qty 1

## 2024-09-11 MED ORDER — ACETAMINOPHEN 500 MG PO TABS
1000.0000 mg | ORAL_TABLET | Freq: Once | ORAL | Status: DC
Start: 2024-09-11 — End: 2024-09-11

## 2024-09-11 MED ORDER — 0.9 % SODIUM CHLORIDE (POUR BTL) OPTIME
TOPICAL | Status: DC | PRN
  Administered 2024-09-11: 1000 mL

## 2024-09-11 MED ORDER — OXYCODONE HCL 5 MG PO TABS: 5.0000 mg | ORAL_TABLET | Freq: Once | ORAL | Status: DC | PRN

## 2024-09-11 MED ORDER — ORAL CARE MOUTH RINSE: 15.0000 mL | Freq: Once | OROMUCOSAL | Status: AC

## 2024-09-11 MED ORDER — SODIUM CHLORIDE 0.9% FLUSH: 3.0000 mL | INTRAVENOUS | Status: DC | PRN

## 2024-09-11 MED ORDER — SODIUM CHLORIDE 0.9 % IV SOLN: 250.0000 mL | INTRAVENOUS | Status: DC | PRN

## 2024-09-11 MED ORDER — IBUPROFEN 200 MG PO TABS: 600.0000 mg | ORAL_TABLET | Freq: Four times a day (QID) | ORAL | 0 refills | Status: AC

## 2024-09-11 MED ORDER — BUPIVACAINE-EPINEPHRINE (PF) 0.25% -1:200000 IJ SOLN
INTRAMUSCULAR | Status: AC
Start: 2024-09-11 — End: 2024-09-11
  Filled 2024-09-11: qty 30

## 2024-09-11 MED ORDER — CEFAZOLIN SODIUM-DEXTROSE 2-4 GM/100ML-% IV SOLN
2.0000 g | INTRAVENOUS | Status: AC
  Administered 2024-09-11: 2 g via INTRAVENOUS
  Filled 2024-09-11: qty 100

## 2024-09-11 MED ORDER — BUPIVACAINE-EPINEPHRINE 0.25% -1:200000 IJ SOLN
INTRAMUSCULAR | Status: DC | PRN
  Administered 2024-09-11: 12 mL

## 2024-09-11 SURGICAL SUPPLY — 24 items
CANISTER SUCTION 3000ML PPV (SUCTIONS) IMPLANT
CHLORAPREP W/TINT 26 (MISCELLANEOUS) ×1 IMPLANT
COVER SURGICAL LIGHT HANDLE (MISCELLANEOUS) ×1 IMPLANT
DERMABOND ADVANCED .7 DNX12 (GAUZE/BANDAGES/DRESSINGS) IMPLANT
DRAPE SURG ORHT 6 SPLT 77X108 (DRAPES) IMPLANT
ELECTRODE REM PT RTRN 9FT ADLT (ELECTROSURGICAL) ×1 IMPLANT
GLOVE BIO SURGEON STRL SZ7 (GLOVE) ×1 IMPLANT
GOWN STRL REUS W/ TWL LRG LVL3 (GOWN DISPOSABLE) ×2 IMPLANT
GOWN STRL REUS W/ TWL XL LVL3 (GOWN DISPOSABLE) ×1 IMPLANT
KIT BASIN OR (CUSTOM PROCEDURE TRAY) ×1 IMPLANT
KIT TURNOVER KIT B (KITS) ×1 IMPLANT
MARKER SKIN DUAL TIP RULER LAB (MISCELLANEOUS) ×1 IMPLANT
NDL HYPO 25GX1X1/2 BEV (NEEDLE) ×1 IMPLANT
NEEDLE HYPO 25GX1X1/2 BEV (NEEDLE) ×1 IMPLANT
PACK GENERAL/GYN (CUSTOM PROCEDURE TRAY) ×1 IMPLANT
PAD ARMBOARD POSITIONER FOAM (MISCELLANEOUS) ×2 IMPLANT
SOLN 0.9% NACL 1000 ML (IV SOLUTION) ×1 IMPLANT
SOLN 0.9% NACL POUR BTL 1000ML (IV SOLUTION) ×1 IMPLANT
SPECIMEN JAR SMALL (MISCELLANEOUS) ×1 IMPLANT
SUT MNCRL AB 4-0 PS2 18 (SUTURE) ×1 IMPLANT
SUT VIC AB 2-0 SH 18 (SUTURE) IMPLANT
SUT VIC AB 3-0 SH 18 (SUTURE) ×1 IMPLANT
SYR CONTROL 10ML LL (SYRINGE) ×2 IMPLANT
TOWEL GREEN STERILE FF (TOWEL DISPOSABLE) ×1 IMPLANT

## 2024-09-11 NOTE — Anesthesia Procedure Notes (Signed)
 Procedure Name: MAC Date/Time: 09/11/2024 7:31 AM  Performed by: Arvell Edsel HERO, CRNAPre-anesthesia Checklist: Patient identified, Emergency Drugs available, Suction available, Patient being monitored and Timeout performed Patient Re-evaluated:Patient Re-evaluated prior to induction Oxygen Delivery Method: Simple face mask Preoxygenation: Pre-oxygenation with 100% oxygen Induction Type: IV induction Placement Confirmation: positive ETCO2 Dental Injury: Teeth and Oropharynx as per pre-operative assessment

## 2024-09-11 NOTE — H&P (Signed)
 Carol Roberts 1946-08-10  [id number].    HPI:  78 y/o F with 2 back masses who presents for elective excision. She reports that she is in her usual state of health and denies any recent changes in medication.   ROS: Review of Systems  Constitutional: Negative.   HENT: Negative.    Eyes: Negative.   Respiratory: Negative.    Cardiovascular: Negative.   Gastrointestinal: Negative.   Genitourinary: Negative.   Musculoskeletal: Negative.   Skin:        2 back masses  Neurological: Negative.   Endo/Heme/Allergies: Negative.   Psychiatric/Behavioral: Negative.      Family History  Problem Relation Age of Onset   Heart disease Mother    Heart disease Maternal Grandmother    Heart attack Brother    Breast cancer Neg Hx     Past Medical History:  Diagnosis Date   Anemia    off and on   Anxiety    Arthritis    knees, hands, hips - hx of dislocated hip many yrs ago    Fibromyalgia    GERD (gastroesophageal reflux disease)    H/O hiatal hernia    paraesophageal hernia, had surgery to repair   Headache(784.0)    ocas migraines   Hypertension    Paraesophageal hiatal hernia    causes vomiting, chest and left shoulder pain, sometimes left jaw pain    Past Surgical History:  Procedure Laterality Date   2011 RIGHT KNEE ARTHROSCOPY     APPENDECTOMY     BREAST EXCISIONAL BIOPSY Left 1987   BUNIONECTOMY Right 06/14/2016   Procedure: RIGHT LAPIDUS AND MODIFIED MCBRIDE BUNIONECTOMY;  Surgeon: Norleen Armor, MD;  Location: [facility];  Service: Orthopedics;  Laterality: Right;  RIGHT LAPIDUS AND MODIFIED MCBRIDE BUNIONECTOMY   COLON SURGERY     bowel resection for diverticulitis   COLOSTOMY CLOSURE  2010   ESOPHAGEAL MANOMETRY N/A 06/08/2013   Procedure: ESOPHAGEAL MANOMETRY (EM);  Surgeon: Lesta JULIANNA Fitz, MD;  Location: [facility];  Service: Endoscopy;  Laterality: N/A;  Dr. Dianna will read Manometry for Dr. Fitz FREES TOE SURGERY Right 06/14/2016    Procedure: RIGHT TWO TO THREE HAMMER TOE CORRECTION;  Surgeon: Norleen Armor, MD;  Location: [facility];  Service: Orthopedics;  Laterality: Right;  RIGHT TWO TO THREE HAMMER TOE CORRECTION   HERNIA REPAIR     HIATAL HERNIA REPAIR  2005   [place]   HIATAL HERNIA REPAIR  2007   Lap with MTF bridge mesh.  Dr Gladis,    LAPAROSCOPIC INCISIONAL / UMBILICAL / VENTRAL HERNIA REPAIR  11/2010   lap VWH with mesh   LEFT COLECTOMY  2010   with colostomy for perforated diverticulitis   TENDON TRANSFER Right 06/14/2016   Procedure: FLEXOR TO EXTENSOR TRANSFER;  Surgeon: Norleen Armor, MD;  Location: [facility];  Service: Orthopedics;  Laterality: Right;  FLEXOR TO EXTENSOR TRANSFER   VENTRAL HERNIA REPAIR  09/06/2010   WEIL OSTEOTOMY Right 06/14/2016   Procedure: TWO TO THREE METATARSAL WEIL OSTEOTOMY;  Surgeon: Norleen Armor, MD;  Location:  SURGERY CENTER;  Service: Orthopedics;  Laterality: Right;  TWO TO THREE METATARSAL WEIL OSTEOTOMY    Social History:  reports that she has never smoked. She has never used smokeless tobacco. She reports current alcohol use. She reports that she does not use drugs.  Allergies:  Allergies  Allergen Reactions   Adhesive [Tape] Rash  Medications Prior to Admission  Medication Sig Dispense Refill   acetaminophen  (TYLENOL ) 500 MG tablet Take 1,000 mg by mouth every 6 (six) hours as needed for pain.     aspirin  EC 81 MG tablet Take 1 tablet (81 mg total) by mouth daily.     atorvastatin  (LIPITOR) 10 MG tablet TAKE 1 TABLET BY MOUTH EVERY DAY 90 tablet 0   buPROPion (WELLBUTRIN) 100 MG tablet Take 100 mg by mouth 2 (two) times daily.     busPIRone (BUSPAR) 30 MG tablet Take 30 mg by mouth 2 (two) times daily.     Cholecalciferol (VITAMIN D3) 50 MCG (2000 UT) CAPS Take 2,000 Units by mouth daily in the afternoon.     hydrochlorothiazide  (HYDRODIURIL ) 25 MG tablet TAKE 1 TABLET BY MOUTH EVERY DAY 90 tablet 3   Lactobacillus  (ACIDOPHILUS PO) Take 1 capsule by mouth daily.      loratadine (CLARITIN) 10 MG tablet Take 10 mg by mouth daily as needed for allergies.      losartan  (COZAAR ) 50 MG tablet TAKE 1 TABLET BY MOUTH EVERY DAY 90 tablet 3   metoprolol  succinate (TOPROL -XL) 50 MG 24 hr tablet TAKE 1 AND 1/2 TABLET BY MOUTH EVERY DAY 45 tablet 0   Multiple Vitamins-Minerals (PRESERVISION AREDS 2) CAPS Take 1 tablet by mouth in the morning and at bedtime.     denosumab  (PROLIA ) 60 MG/ML SOSY injection Inject 60 mg into the skin every 6 (six) months.     dicyclomine (BENTYL) 20 MG tablet Take 20 mg by mouth 4 (four) times daily as needed for spasms.       Physical Exam: Blood pressure (!) 138/52, pulse (!) 54, temperature 98.7 F (37.1 C), resp. rate 16, height 5' 1 (1.549 m), weight 65.8 kg, SpO2 100%. Gen: female, NAD Back: 2 subcutaneous masses marked  Results for orders placed or performed during the hospital encounter of 09/11/24 (from the past 48 hours)  CBC per protocol     Status: Abnormal   Collection Time: 09/11/24  6:20 AM  Result Value Ref Range   WBC 4.4 4.0 - 10.5 K/uL   RBC 3.62 (L) 3.87 - 5.11 MIL/uL   Hemoglobin 11.9 (L) 12.0 - 15.0 g/dL   HCT 64.6 (L) 63.9 - 53.9 %   MCV 97.5 80.0 - 100.0 fL   MCH 32.9 26.0 - 34.0 pg   MCHC 33.7 30.0 - 36.0 g/dL   RDW 86.4 88.4 - 84.4 %   Platelets 197 150 - 400 K/uL   nRBC 0.0 0.0 - 0.2 %    Comment: Performed at [facility], [address], [place], [place] [address]  Basic metabolic panel per protocol     Status: Abnormal   Collection Time: 09/11/24  6:20 AM  Result Value Ref Range   Sodium 138 135 - 145 mmol/L   Potassium 3.4 (L) 3.5 - 5.1 mmol/L   Chloride 102 98 - 111 mmol/L   CO2 26 22 - 32 mmol/L   Glucose, Bld 70 70 - 99 mg/dL    Comment: Glucose reference range applies only to samples taken after fasting for at least 8 hours.   BUN 18 8 - 23 mg/dL   Creatinine, Ser 9.17 0.44 - 1.00 mg/dL   Calcium  9.7 8.9 - 10.3 mg/dL   GFR,  Estimated >39 >39 mL/min    Comment: (NOTE) Calculated using the CKD-EPI Creatinine Equation (2021)    Anion gap 10 5 - 15  Comment: Performed at [facility], [address], [place], [place] [address]   No results found.  Assessment/Plan 78 y/o F with 2 back masses  - Will proceed to the OR. We discussed the alternatives and potential risks of surgery, including but not limited to: bleeding, infection, damage to surrounding structures, recurrence, wound complications, and need for additional procedures. All questions were addressed and consent was obtained.    Cordella DELENA Polly Marlis [facility] 09/11/2024, 7:04 AM Please see Amion for pager number during day hours 7:00am-4:30pm or 7:00am -11:30am on weekends

## 2024-09-11 NOTE — Discharge Instructions (Signed)
 Outpatient Surgery Home Care Instruction  Activity  The effects of anesthesia are still present and drowsiness may result.  Limit activity for the first 24 hours, then you may return to normal daily activities. Returning to normal daily activities as soon as you can following surgery will enhance recovery time.  Do not drive or operate heavy machinery within 24 hours of taking narcotic pain medications.   Do not mow the lawn, use a vacuum cleaner, or do any other strenuous activities without first consulting your surgical team.   Diet Drink plenty of fluids and eat light meals today, then resume regular diet. Some patients may find their appetite is poor for a week or two after surgery. This is a normal result of the stress of surgery-your appetite will return in time.   There are no specific diet restrictions after surgery.   Dressing and Wound Care  Keep your wound or incision site clean and dry.  You may have different types of dressings covering your incisions depending on your operation and your surgeon: Dermabond/Durabond (skin glue): This will usually remain in place for 10-14 days, then naturally fall off your skin. You may take a shower 24 hrs after surgery, carefully wash, not scrub the incision site with a mild non-scented soap. Pat dry with a soft towel.  Do not pick or peel skin glue off.  You can shower and let the water fall on the dressings above. Do not soak or submerge your incision(s) in a bath tub, hot tub, or swimming pool, until your doctor says it is ok to do so or the incision(s) have completely healed, usually about 2-4 weeks.  Do not use creams, powder, salves or balms on your incision(s).   What to Expect After Surgery   Moderate discomfort controlled with medications  Minimal drainage from incision  Feeling fatigue and weak  Constipation after surgery is common. Drink plenty fluids and eat a high fiber diet.   Pain Control: Prescribed Non-Narcotic Pain  Medication  You will be given three prescriptions.  Two of them will be for prescription strength ibuprofen (i.e. Advil) and prescription strength acetaminophen  (i.e. Tylenol ).  The vast majority of patients will just need these two medications.  One prescription will be for a 'rescue' prescription of an oral narcotic (oxycodone ).  You may fill this if needed.  You will alternate taking the ibuprofen (600mg ) every 6 hours and also the acetaminophen  (650mg ) every 6 hours so that you are taking one of those medications every 3 hours.  For example: o 0800 - take ibuprofen 600mg  o 1100 - take acetaminophen  650mg  o 1400 - take ibuprofen 600mg  o 1700 - take acetaminophen  650mg  o Etc.  Continue taking this alternating pattern of ibuprofen and acetaminophen  for 3 days  If you cannot take one or the other of these medications, just take the one you can every 6 hours.  If you are comfortable at night, you don't have to wake up and take a medication.  If you are still uncomfortable after taking either ibuprofen or acetaminophen , try gentle stretching exercise and ice packs (a bag of frozen vegetables works great).  If you are still uncomfortable, you may fill the narcotic prescription of Oxycodone  and take as directed.  Once you have completed these prescriptions, your pain level should be low enough to stop taking medications altogether or just use an over the counter medication (ibuprofen or acetaminophen ) as needed.    Pain Control: Over the Counter Medications to take as  needed  Colace/Docusate: May be prescribed by your surgeon to prevent constipation caused by the combination of narcotics, effects of anesthesia, and decreased ambulation.  Hold for loose stools or diarrhea. Take 100 mg 1-2 times a day starting tonight.   Fiber: High fiber foods, extra liquids (water 9-13 cups/day) can also assist with constipation. Examples of high fiber foods are fruit, bran. Prune juice and water are also good liquids  to drink.  Milk of Magnesia/Miralax:  If constipated despite takeing the over the counter stool softeners, you may take Milk of Magnesia or Miralax as directed on bottle to assist with constipation.     Pepcid /Famotidine : May be prescribed while taking naproxen (Aleve) or other NSAIDs such as ibuprofen (Motrin/Advil) to prevent stomach upset or Acid-reflux symptoms. Take 1 tablet 1-2 times a day.   **Constipation: The first bowel movement may occur anywhere between 1-5 days after surgery.  As long as you are not nauseated or not having significant abdominal pain this variation is acceptable. Narcotic pain medications can cause constipation increasing discomfort; early discontinuation will assist with bowel management. If constipated despite taking stool softeners, you may take Milk of Magnesia or Miralax as directed on the bottle.     **Home medications: You may restart your home medications as directed by your respective Primary Care Physician or Surgeon.   When to notify your Doctor or Healthcare Team   Sign of Wound Infection   Fever over 100 degrees.  Wound becomes extremely swollen, shows red streaks, warm to the touch, and/or drainage from the incision site or foul-smelling drainage.  Wound edges separate or opens up  Bleeding or bruising   If you have bleeding, apply pressure to the site and hold the pressure firmly for 5 minutes. If the bleeding continues, apply pressure again and call 911. If the bleeding stopped, call your doctor to report it.   Call your doctor or nurse if you have increased bleeding from your site and increased bruising or a lump forms or gets larger under your skin at the site. Unrelieved Pain   Call your doctor or nurse if your pain gets worse or is not eased 1 hour after taking your pain medicine, or if it is severe and uncontrolled. Nausea and Vomiting   Call your doctor or nurse if you have nausea and vomiting that continues more than 24 hours, will not let you  keep medicine down and will not let you keep fluids down  Fever, Flu-like symptoms   Fever over 100 degrees and/or chills  Gastrointestinal Bleeding Symptoms    Black tarry bowel movements.  This can be normal after surgery on the stomach, but should resolve in a day or two.    Call 911 if you suddenly have signs of blood loss such as:  Vomiting blood  Fast heart rate  Feeling faint, sweaty, or blacking out  Passing bright red blood from your rectum  Blood Clot Symptoms   Tender, swollen or reddened areas in your calf muscle or thighs.  Numbness or tingling in your lower leg or calf, or at the top of your leg or groin  Skin on your leg looks pale or blue or feels cold to touch  Chest pain or have trouble breathing, lightheadedness, fast heart rate  Sudden Onset of Symptoms    Call 911 if you suddenly have:  Leg weakness and spasm  Loss of bladder or bowel function  Seizure  Confusion, severe headache, dizziness or feeling unsteady, problems talking, difficulty  swallowing, and/or numbness or muscle weakness as these could be signs of a stroke.  Follow up Appointment Your follow up appointment should be scheduled 2-3 weeks after your surgery date.  If you have not previously scheduled for a follow-up visit you can be scheduled by contacting [phone].

## 2024-09-11 NOTE — Transfer of Care (Signed)
 Immediate Anesthesia Transfer of Care Note  Patient: Carol Roberts  Procedure(s) Performed: EXCISION MASS, BACK (Back)  Patient Location: PACU  Anesthesia Type:MAC  Level of Consciousness: awake, alert , oriented, and patient cooperative  Airway & Oxygen Therapy: Patient Spontanous Breathing and Patient connected to face mask oxygen  Post-op Assessment: Report given to RN, Post -op Vital signs reviewed and stable, Patient moving all extremities, and Patient moving all extremities X 4  Post vital signs: Reviewed and stable  Last Vitals:  Vitals Value Taken Time  BP 149/52 09/11/24 08:33  Temp 36.6 C 09/11/24 08:35  Pulse 59 09/11/24 08:41  Resp 11 09/11/24 08:41  SpO2 99 % 09/11/24 08:41  Vitals shown include unfiled device data.  Last Pain:  Vitals:   09/11/24 0835  PainSc: 0-No pain         Complications: No notable events documented.

## 2024-09-11 NOTE — Anesthesia Postprocedure Evaluation (Signed)
 Anesthesia Post Note  Patient: Carol Roberts  Procedure(s) Performed: EXCISION MASS, BACK (Back)     Patient location during evaluation: PACU Anesthesia Type: MAC Level of consciousness: awake and alert Pain management: pain level controlled Vital Signs Assessment: post-procedure vital signs reviewed and stable Respiratory status: spontaneous breathing, nonlabored ventilation and respiratory function stable Cardiovascular status: blood pressure returned to baseline Postop Assessment: no apparent nausea or vomiting Anesthetic complications: no   No notable events documented.  Last Vitals:  Vitals:   09/11/24 0547 09/11/24 0835  BP: (!) 138/52   Pulse: (!) 54   Resp: 16   Temp: 37.1 C 36.6 C  SpO2: 100%     Last Pain:  Vitals:   09/11/24 0835  PainSc: 0-No pain                 Vertell Row

## 2024-09-11 NOTE — Op Note (Signed)
 09/11/2024  8:24 AM  PATIENT:  Carol Roberts  78 y.o. female  Patient Care Team: Douglass Ivanoff, MD as PCP - General (Family Medicine) Maranda Leim DEL, MD as PCP - Cardiology (Cardiology) Lennard Lesta FALCON, MD as Consulting Physician (Gastroenterology)  PRE-OPERATIVE DIAGNOSIS:  Subcutaneous back mass x 2  POST-OPERATIVE DIAGNOSIS:  Same (2cm x 2cm x 2cm; 1cm x 1cm x 1cm)  PROCEDURE:  Excision of back mass x 2 (2cm x 2cm x 2cm); 1cm x 1cm x 1cm)  SURGEON:  Cordella RONAL Idler, MD  ASSISTANT: None  ANESTHESIA:   local  COUNTS:  Sponge, needle and instrument counts were reported correct x2 at the conclusion of the operation.  EBL: Minimal  DRAINS: None  SPECIMEN:  Posterior neck mass Mid-back mass  COMPLICATIONS: None  FINDINGS: Cystic lesions of the posterior neck and mid-back  DISPOSITION: PACU in satisfactory condition  INDICATION: 78 year old female with 2 subcutaneous lesions on her back that have become increasingly symptomatic. She desired excision, and I offered to do this in the operating room under local anesthesia. All of her questions were addressed and written consent was obtained.  DESCRIPTION: The patient was identified in preop holding and taken to the OR where she was placed on the operating room table. SCDs were placed. Anesthesia staff were present to monitor her airway.  She was placed prone and her back was then prepped and draped in the usual sterile fashion. A surgical timeout was performed indicating the correct patient, procedure, positioning and need for preoperative antibiotics.  I began with the posterior neck mass. A field block was performed using 0.25% marcaine  with epinephrine . A incision was made overlying the mass using a #15 blade and carried down through the subcutaneous tissue using electrocautery. The mass was encountered and appeared to be a small cystic structure. The mass was freed from surrounding tissues using blunt dissection and  electrocautery, excised, and then passed off the field to be sent as a specimen. The mass measured 1cm x 1cm x 1cm. The wound was irrigated with sterile saline and inspected for hemostasis. Interrupted 3-0 vicryl were used to approximate the dermis followed by a running 4-0 subcuticular monocryl. A layer of dermabond was applied.   The same series of steps was then repeated for the mid-back lesion. This lesion was also cystic in nature and measured 2cm x 2cm x 2cm. She tolerated the procedure well and was brought to PACU in stable condition.

## 2024-09-12 ENCOUNTER — Encounter (HOSPITAL_COMMUNITY): Payer: Self-pay | Admitting: General Surgery

## 2024-09-14 LAB — SURGICAL PATHOLOGY

## 2024-09-16 ENCOUNTER — Other Ambulatory Visit: Payer: Self-pay | Admitting: Family Medicine

## 2024-09-16 DIAGNOSIS — Z1231 Encounter for screening mammogram for malignant neoplasm of breast: Secondary | ICD-10-CM

## 2024-09-22 DIAGNOSIS — Z23 Encounter for immunization: Secondary | ICD-10-CM | POA: Diagnosis not present

## 2024-09-24 ENCOUNTER — Other Ambulatory Visit: Payer: Self-pay | Admitting: Internal Medicine

## 2024-09-28 DIAGNOSIS — L720 Epidermal cyst: Secondary | ICD-10-CM | POA: Diagnosis not present

## 2024-10-03 ENCOUNTER — Other Ambulatory Visit: Payer: Self-pay | Admitting: Internal Medicine

## 2024-10-13 ENCOUNTER — Ambulatory Visit
Admission: RE | Admit: 2024-10-13 | Discharge: 2024-10-13 | Disposition: A | Discharge status: Home / Self Care | Source: Ambulatory Visit | Attending: Family Medicine | Admitting: Family Medicine

## 2024-10-13 DIAGNOSIS — Z1231 Encounter for screening mammogram for malignant neoplasm of breast: Secondary | ICD-10-CM | POA: Diagnosis not present

## 2024-11-25 ENCOUNTER — Telehealth: Payer: Self-pay | Admitting: Internal Medicine

## 2024-11-25 MED ORDER — METOPROLOL SUCCINATE ER 50 MG PO TB24: 75.0000 mg | ORAL_TABLET | Freq: Every day | ORAL | 0 refills | Status: DC

## 2024-11-25 NOTE — Telephone Encounter (Signed)
° °*  STAT* If patient is at the pharmacy, call can be transferred to refill team.   1. Which medications need to be refilled? (please list name of each medication and dose if known)   metoprolol  succinate (TOPROL -XL) 50 MG 24 hr tablet   2. Which pharmacy/location (including street and city if local pharmacy) is medication to be sent to?  [id number] - [place], [place] - [address]    3. Do they need a 30 day or 90 day supply? 30

## 2024-11-25 NOTE — Telephone Encounter (Signed)
 Pt scheduled 12/14/24, refill sent.

## 2024-12-06 DIAGNOSIS — J988 Other specified respiratory disorders: Secondary | ICD-10-CM | POA: Diagnosis not present

## 2024-12-12 NOTE — Progress Notes (Unsigned)
 " Cardiology Office Note   Date:  12/14/2024  ID:  Carol Roberts, DOB [birth year], MRN [id number] PCP: Dyane Anthony RAMAN, FNP  [facility] Providers Cardiologist:  Leim Moose, MD     History of Present Illness Carol Roberts is a 79 y.o. female with history of hypertension, palpitations (monitor without significant arrhythmia), CAD (CAC score 1 and minimal plaquing of LAD by CCTA in 2018), mild AI/TR, and right carotid bruit (bilateral 1-39% stenosis).   She was initially referred by PCP for evaluations of palpitations in 2017.  Echo 09/17/2016 LVEF is 60-65%, grade 1 DD, no RWMA, mild AI, and mild TR.  30-day heart monitor 10/31/2016 showed NSR with no pauses or arrhythmias.  US  carotid due to bruit heard on exam 09/18/2016; results less than 50% stenosis in bilateral ICA.  Coronary CT 11/01/2017 due to DOE and strong family history of CAD.  Results; aortic atherosclerosis, right dominant coronary arteries with no anomalies, less than 30% calcified plaque in proximal LAD, and then 20% calcified plaque in proximal RCA. Calcium  score 1 which is 43rd percentile for age and sex.  Most recent carotid duplex 09/26/2022 results; bilateral ICA with 1-39% stenosis. Recommended repeat in 2 to 3 years. Recent uneventful elective back mass removal surgery 09/10/24.   She was last seen in office 09/23/2023 by Dr. Okey. She was doing relatively well from a cardiac standpoint and no changes were made except Lipitor cut back in setting of controlled LDL.    She presents today for overdue annual follow-up. 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 She is starting to navigate retired life and plans on joining the [employer] to do trw automotive.  She notes rare palpitations now that occur maybe twice a year. She has some occasional right ankle swelling from her chronic right knee pain. She notes SOB when climbing 2 or more flights of stairs that improves with rest. She denies  chest pain, lower extremity edema, fatigue, palpitations, melena, hematuria, hemoptysis, diaphoresis, weakness, presyncope, syncope, orthopnea, and PND.  ROS: All systems negative unless otherwise indicated in HPI.  Studies Reviewed EKG Interpretation Date/Time:  Monday December 14 2024 09:31:35 EST Ventricular Rate:  51 PR Interval:  170 QRS Duration:  90 QT Interval:  458 QTC Calculation: 422 R Axis:   6  Text Interpretation: Sinus bradycardia Nonspecific ST and T wave abnormality Confirmed by Teresa Fish ([id number]) on 12/14/2024 9:38:21 AM    Cardiac Studies & Procedures   ______________________________________________________________________________________________     ECHOCARDIOGRAM  ECHOCARDIOGRAM COMPLETE 09/17/2016  Narrative *[name]* [address] [place], [place] [address] [phone]  ------------------------------------------------------------------- Transthoracic Echocardiography  Patient:    Carol Roberts, Carol Roberts MR #:       [id number] Study Date: 09/17/2016 Gender:     F Age:        37 Height:     154.9 cm Weight:     72 kg BSA:        1.79 m^2 Pt. Status: Room:  SONOGRAPHER  Sherida Lawyer, RDCS TISA Douglass Ivanoff [id number] BARTON Douglass Ivanoff [id number] PERFORMING   [facility], Outpatient ATTENDING    Annabella Scarce, MD  cc:  ------------------------------------------------------------------- LV EF: 60% -   65%  ------------------------------------------------------------------- Indications:      Palpitation (R00.2).  ------------------------------------------------------------------- History:   PMH:  No prior cardiac history.  ------------------------------------------------------------------- Study Conclusions  - Left ventricle: The cavity size was normal. There was mild concentric hypertrophy. Systolic  function was normal. The estimated ejection fraction was in the range of 60% to 65%. Wall motion was normal; there were no  regional wall motion abnormalities. Doppler parameters are consistent with abnormal left ventricular relaxation (grade 1 diastolic dysfunction). Doppler parameters are consistent with high ventricular filling pressure. - Aortic valve: Transvalvular velocity was within the normal range. There was no stenosis. There was mild regurgitation. - Mitral valve: Transvalvular velocity was within the normal range. There was no evidence for stenosis. There was no regurgitation. - Left atrium: The atrium was moderately dilated. - Right ventricle: The cavity size was normal. Wall thickness was normal. Systolic function was normal. - Right atrium: The atrium was mildly dilated. - Atrial septum: No defect or patent foramen ovale was identified by color flow Doppler. - Tricuspid valve: There was mild regurgitation. - Pulmonary arteries: Systolic pressure was within the normal range. PA peak pressure: 33 mm Hg (S).  ------------------------------------------------------------------- Study data:  No prior study was available for comparison.  Study status:  Routine.  Procedure:  The patient reported no pain pre or post test. Transthoracic echocardiography. Image quality was adequate.  Study completion:  There were no complications. Transthoracic echocardiography.  M-mode, complete 2D, 3D, spectral Doppler, and color Doppler.  Birthdate:  Patient birthdate: 1946/03/19.  Age:  Patient is 79 yr old.  Sex:  Gender: female. BMI: 30 kg/m^2.  Blood pressure:     147/65  Patient status: Outpatient.  Study date:  Study date: 09/17/2016. Study time: 07:22 AM.  Location:  [facility]  -------------------------------------------------------------------  ------------------------------------------------------------------- Left ventricle:  The cavity size was normal. There was mild concentric hypertrophy. Systolic function was normal. The estimated ejection fraction was in the range of 60% to 65%. Wall  motion was normal; there were no regional wall motion abnormalities. Doppler parameters are consistent with abnormal left ventricular relaxation (grade 1 diastolic dysfunction). Doppler parameters are consistent with high ventricular filling pressure.  ------------------------------------------------------------------- Aortic valve:   Trileaflet; normal thickness leaflets. Mobility was not restricted.  Doppler:  Transvalvular velocity was within the normal range. There was no stenosis. There was mild regurgitation.  ------------------------------------------------------------------- Aorta:  Aortic root: The aortic root was normal in size.  ------------------------------------------------------------------- Mitral valve:   Structurally normal valve.   Mobility was not restricted.  Doppler:  Transvalvular velocity was within the normal range. There was no evidence for stenosis. There was no regurgitation.    Peak gradient (D): 3 mm Hg.  ------------------------------------------------------------------- Left atrium:  The atrium was moderately dilated.  ------------------------------------------------------------------- Atrial septum:  No defect or patent foramen ovale was identified by color flow Doppler.  ------------------------------------------------------------------- Right ventricle:  The cavity size was normal. Wall thickness was normal. Systolic function was normal.  ------------------------------------------------------------------- Pulmonic valve:    Structurally normal valve.   Cusp separation was normal.  Doppler:  Transvalvular velocity was within the normal range. There was no evidence for stenosis. There was no regurgitation.  ------------------------------------------------------------------- Tricuspid valve:   Structurally normal valve.    Doppler: Transvalvular velocity was within the normal range. There was  mild regurgitation.  ------------------------------------------------------------------- Pulmonary artery:   The main pulmonary artery was normal-sized. Systolic pressure was within the normal range.  ------------------------------------------------------------------- Right atrium:  The atrium was mildly dilated.  ------------------------------------------------------------------- Pericardium:  There was no pericardial effusion.  ------------------------------------------------------------------- Systemic veins: Inferior vena cava: The vessel was dilated. The respirophasic diameter changes were in the normal range (>= 50%), consistent with elevated central venous pressure.  ------------------------------------------------------------------- Measurements  Left ventricle  Value        Reference LV ID, ED, PLAX chordal        (L)     39.7  mm     43 - 52 LV ID, ES, PLAX chordal                26.3  mm     23 - 38 LV fx shortening, PLAX chordal         34    %      >=29 LV PW thickness, ED                    10.8  mm     --------- IVS/LV PW ratio, ED                    1.08         <=1.3 Stroke volume, 2D                      86    ml     --------- Stroke volume/bsa, 2D                  48    ml/m^2 --------- LV e&', lateral                         7.18  cm/s   --------- LV E/e&', lateral                       12.97        --------- LV e&', medial                          5.44  cm/s   --------- LV E/e&', medial                        17.11        --------- LV e&', average                         6.31  cm/s   --------- LV E/e&', average                       14.75        ---------  Ventricular septum                     Value        Reference IVS thickness, ED                      11.7  mm     ---------  LVOT                                   Value        Reference LVOT ID, S                             20    mm     --------- LVOT area                               3.14  cm^2   --------- LVOT peak velocity, S                  93.1  cm/s   --------- LVOT mean velocity, S                  72.8  cm/s   --------- LVOT VTI, S                            27.4  cm     ---------  Aorta                                  Value        Reference Aortic root ID, ED                     30    mm     ---------  Left atrium                            Value        Reference LA ID, A-P, ES                         40    mm     --------- LA ID/bsa, A-P                 (H)     2.24  cm/m^2 <=2.2 LA volume, S                           49.1  ml     --------- LA volume/bsa, S                       27.5  ml/m^2 --------- LA volume, ES, 1-p A4C                 68    ml     --------- LA volume/bsa, ES, 1-p A4C             38.1  ml/m^2 --------- LA volume, ES, 1-p A2C                 35.5  ml     --------- LA volume/bsa, ES, 1-p A2C             19.9  ml/m^2 ---------  Mitral valve                           Value        Reference Mitral E-wave peak velocity            93.1  cm/s   --------- Mitral A-wave peak velocity            99.4  cm/s   --------- Mitral deceleration time               155   ms     150 - 230 Mitral peak gradient, D                3     mm Hg  --------- Mitral E/A ratio, peak  0.9          ---------  Pulmonary arteries                     Value        Reference PA pressure, S, DP             (H)     33    mm Hg  <=30  Tricuspid valve                        Value        Reference Tricuspid regurg peak velocity         252   cm/s   --------- Tricuspid peak RV-RA gradient          25    mm Hg  ---------  Systemic veins                         Value        Reference Estimated CVP                          8     mm Hg  ---------  Right ventricle                        Value        Reference TAPSE                                  17.1  mm     --------- RV pressure, S, DP             (H)     33    mm Hg  <=30 RV s&', lateral, S                       8.7   cm/s   ---------  Legend: (L)  and  (H)  mark values outside specified reference range.  ------------------------------------------------------------------- Prepared and Electronically Authenticated by  Annabella Scarce, MD 2017-10-09T08:27:32    MONITORS  CARDIAC EVENT MONITOR 09/26/2016  Narrative  Sinus bradycardia to sinus tachycardia.  No pauses or arrhythmias.  Normal 30 day event monitor.   CT SCANS  CT CORONARY MORPH W/CTA COR W/SCORE 11/01/2017  Addendum 11/01/2017  4:58 PM ADDENDUM REPORT: 11/01/2017 16:56  EXAM: OVER-READ INTERPRETATION  CT CHEST  The following report is an over-read performed by radiologist Dr. Toribio Cove [facility] on 11/01/2017. This over-read does not include interpretation of cardiac or coronary anatomy or pathology. The coronary calcium  score and cardiac CTA interpretation by the cardiologist is attached.  COMPARISON:  None.  FINDINGS: Aortic atherosclerosis. Within the visualized portions of the thorax there are no suspicious appearing pulmonary nodules or masses, there is no acute consolidative airspace disease, no pleural effusions, no pneumothorax and no lymphadenopathy. Postoperative changes of fundoplication. Visualized portions of the upper abdomen are unremarkable. There are no aggressive appearing lytic or blastic lesions noted in the visualized portions of the skeleton.  IMPRESSION: 1.  Aortic Atherosclerosis (ICD10-I70.0).   Electronically Signed By: Toribio Aye M.D. On: 11/01/2017 16:56  Narrative CLINICAL DATA:  Chest pain  EXAM: Cardiac CTA  MEDICATIONS: Sub lingual nitro. 4mg  and lopressor  10mg   TECHNIQUE: The patient was scanned on a  Siem[employer] Force 192 slice scanner. Gantry rotation speed was 250 msecs. Collimation was .6 mm. A 100 kV prospective scan was triggered in the ascending thoracic aorta at 140 HU's Full mA was used between 35% and 75% of the R-R  interval. Average HR during the scan was 68 bpm. The 3D data set was interpreted on a dedicated work station using MPR, MIP and VRT modes. A total of 80 cc of contrast was used.  FINDINGS: Non-cardiac: See separate report from [facility]. No significant findings on limited lung and soft tissue windows.  Calcium  Score: Very tiny area of punctate calcium  seen in proximal LAD and proximal RCA  Coronary Arteries: Right dominant with no anomalies  LM: Normal  LAD:  Less than 30% calcified plaque in proximal LAD  D1:  Normal  D2: Normal  Circumflex:  Normal  OM1:  Small and normal  OM2:  Normal  OM3:  Normal  RCA:  Less than 20% calcified plaque in proximal vessel  PDA: Normal  PLA:  Normal  IMPRESSION: 1) Calcium  score 1 which is 43 rd percentile for age and sex  2) No obstructive CAD see description above right dominant  3) Normal aortic root 3.1 cm  4) Mildly dilated main pulmonary artery 2.9 cm  Maude Emmer  Electronically Signed: By: Maude Emmer M.D. On: 11/01/2017 15:23     ______________________________________________________________________________________________      Risk Assessment/Calculations           Physical Exam VS:  BP 122/64 (BP Location: Left Arm, Patient Position: Sitting, Cuff Size: Normal)   Pulse (!) 51   Ht 5' 1 (1.549 m)   Wt 129 lb 6.4 oz (58.7 kg)   SpO2 100%   BMI 24.45 kg/m        Wt Readings from Last 3 Encounters:  12/14/24 129 lb 6.4 oz (58.7 kg)  09/11/24 145 lb (65.8 kg)  09/23/23 165 lb 6.4 oz (75 kg)    GEN: Well nourished, well developed in no acute distress NECK: No JVD; Right carotid bruit (since 2017)  CARDIAC: RRR, no murmurs, rubs, gallops RESPIRATORY:  Clear to auscultation without rales, wheezing or rhonchi  ABDOMEN: Soft, non-tender, non-distended EXTREMITIES:  No edema; No deformity   ASSESSMENT AND PLAN  Mild CAD/ HLD-  Coronary CT 11/01/2017 results; aortic atherosclerosis,  right dominant coronary arteries with no anomalies, less than 30% calcified plaque in proximal LAD, and then 20% calcified plaque in proximal RCA. Calcium  score 1 which is 43rd percentile for age and sex. Last LDL 47 07/09/24 with primary care. Today she notes SOB upon climbing 2 or flights of stairs which seems appropriate for level of activity. No accelerating angina. Plans to join a gym this year since she recently retired. Discussed to let us  know if SOB worsens. Denies CP and syncope today. Continue aspirin  81 mg, atorvastatin  10 mg, and metoprolol  succinate 75 mg. Repeat CMET today.   Hypertension - BP today 122/64. Does take BP at home occasionally with readings of 120's SBP. Discussed to monitor BP at home at least 2 hours after medications and sitting for 5-10 minutes. Educated to take BP 2-3 times per week. Continue hydrochlorothiazide  25 mg and metoprolol  succinate 75 mg. Given recent hypokalemia, rechecking lytes today.  Mild AI/TR- Echo 09/17/2016 LVEF is 60-65%, grade 1 DD, no RWMA, mild AI, and mild TR. Per guidelines, repeat echo ordered today.   Right carotid bruit - Right carotid bruit heard 2017. Most recent carotid duplex 09/26/2022 results;  bilateral ICA with 1-39% stenosis. Denies syncope, presyncope, dizziness, and lightheadedness. Recommendations to repeat every 2-3 years. Order repeat carotid duplex today.   Palpitations - Today notes rare palpitations that occur once or twice a year. Overall well controlled. Continue metoprolol  succinate 75 mg daily.  Mild anemia- Recent back mass removal procedure 09/2024. Hbg 11.9 09/11/24. Repeat CBC today.        Dispo: Follow up on 1 year or sooner pending echo and carotid US .   Signed, Mardy KATHEE Pizza, FNP  "

## 2024-12-14 ENCOUNTER — Ambulatory Visit: Attending: Physician Assistant

## 2024-12-14 ENCOUNTER — Encounter: Payer: Self-pay | Admitting: Physician Assistant

## 2024-12-14 VITALS — BP 122/64 | HR 51 | Ht 61.0 in | Wt 129.4 lb

## 2024-12-14 DIAGNOSIS — D649 Anemia, unspecified: Secondary | ICD-10-CM

## 2024-12-14 DIAGNOSIS — I6523 Occlusion and stenosis of bilateral carotid arteries: Secondary | ICD-10-CM | POA: Diagnosis not present

## 2024-12-14 DIAGNOSIS — I10 Essential (primary) hypertension: Secondary | ICD-10-CM

## 2024-12-14 DIAGNOSIS — E876 Hypokalemia: Secondary | ICD-10-CM

## 2024-12-14 DIAGNOSIS — E782 Mixed hyperlipidemia: Secondary | ICD-10-CM

## 2024-12-14 DIAGNOSIS — I2510 Atherosclerotic heart disease of native coronary artery without angina pectoris: Secondary | ICD-10-CM

## 2024-12-14 DIAGNOSIS — R002 Palpitations: Secondary | ICD-10-CM | POA: Diagnosis not present

## 2024-12-14 MED ORDER — ATORVASTATIN CALCIUM 10 MG PO TABS: 10.0000 mg | ORAL_TABLET | Freq: Every day | ORAL | 3 refills | Status: AC

## 2024-12-14 MED ORDER — HYDROCHLOROTHIAZIDE 25 MG PO TABS: 25.0000 mg | ORAL_TABLET | Freq: Every day | ORAL | 3 refills | Status: AC

## 2024-12-14 NOTE — Patient Instructions (Signed)
 Medication Instructions:  NO CHANGES *If you need a refill on your cardiac medications before your next appointment, please call your pharmacy*  Lab Work: CMET AND CBC TODAY If you have labs (blood work) drawn today and your tests are completely normal, you will receive your results only by: MyChart Message (if you have MyChart) OR A paper copy in the mail If you have any lab test that is abnormal or we need to change your treatment, we will call you to review the results.  Testing/Procedures:[address] Your physician has requested that you have an echocardiogram. Echocardiography is a painless test that uses sound waves to create images of your heart. It provides your doctor with information about the size and shape of your heart and how well your hearts chambers and valves are working. This procedure takes approximately one hour. There are no restrictions for this procedure. Please do NOT wear cologne, perfume, aftershave, or lotions (deodorant is allowed). Please arrive 15 minutes prior to your appointment time.  Please note: We ask at that you not bring children with you during ultrasound (echo/ vascular) testing. Due to room size and safety concerns, children are not allowed in the ultrasound rooms during exams. Our front office staff cannot provide observation of children in our lobby area while testing is being conducted. An adult accompanying a patient to their appointment will only be allowed in the ultrasound room at the discretion of the ultrasound technician under special circumstances. We apologize for any inconvenience.     Your physician has requested that you have a carotid duplex. This test is an ultrasound of the carotid arteries in your neck. It looks at blood flow through these arteries that supply the brain with blood. Allow one hour for this exam. There are no restrictions or special instructions.   Follow-Up: At [facility], you and your health needs  are our priority.  As part of our continuing mission to provide you with exceptional heart care, our providers are all part of one team.  This team includes your primary Cardiologist (physician) and Advanced Practice Providers or APPs (Physician Assistants and Nurse Practitioners) who all work together to provide you with the care you need, when you need it.  Your next appointment:   1 year(s)  Provider:   Vina Gull, MD

## 2024-12-15 ENCOUNTER — Ambulatory Visit: Payer: Self-pay

## 2024-12-15 LAB — CBC
Hematocrit: 36.5 % (ref 34.0–46.6)
Hemoglobin: 11.9 g/dL (ref 11.1–15.9)
MCH: 32.3 pg (ref 26.6–33.0)
MCHC: 32.6 g/dL (ref 31.5–35.7)
MCV: 99 fL — ABNORMAL HIGH (ref 79–97)
Platelets: 264 x10E3/uL (ref 150–450)
RBC: 3.68 x10E6/uL — ABNORMAL LOW (ref 3.77–5.28)
RDW: 11.9 % (ref 11.7–15.4)
WBC: 6.6 x10E3/uL (ref 3.4–10.8)

## 2024-12-15 LAB — COMPREHENSIVE METABOLIC PANEL WITH GFR
ALT: 13 IU/L (ref 0–32)
AST: 20 IU/L (ref 0–40)
Albumin: 4 g/dL (ref 3.8–4.8)
Alkaline Phosphatase: 48 IU/L — ABNORMAL LOW (ref 49–135)
BUN/Creatinine Ratio: 25 (ref 12–28)
BUN: 20 mg/dL (ref 8–27)
Bilirubin Total: 0.6 mg/dL (ref 0.0–1.2)
CO2: 25 mmol/L (ref 20–29)
Calcium: 9.9 mg/dL (ref 8.7–10.3)
Chloride: 97 mmol/L (ref 96–106)
Creatinine, Ser: 0.8 mg/dL (ref 0.57–1.00)
Globulin, Total: 2.2 g/dL (ref 1.5–4.5)
Glucose: 87 mg/dL (ref 70–99)
Potassium: 4 mmol/L (ref 3.5–5.2)
Sodium: 135 mmol/L (ref 134–144)
Total Protein: 6.2 g/dL (ref 6.0–8.5)
eGFR: 76 mL/min/1.73

## 2024-12-22 ENCOUNTER — Other Ambulatory Visit: Payer: Self-pay | Admitting: Internal Medicine

## 2024-12-24 ENCOUNTER — Other Ambulatory Visit: Payer: Self-pay | Admitting: Internal Medicine

## 2025-01-06 ENCOUNTER — Ambulatory Visit (HOSPITAL_COMMUNITY)

## 2025-01-06 ENCOUNTER — Ambulatory Visit (HOSPITAL_COMMUNITY): Admission: RE | Admit: 2025-01-06 | Source: Ambulatory Visit

## 2025-01-29 ENCOUNTER — Ambulatory Visit (HOSPITAL_COMMUNITY)
